# Patient Record
Sex: Male | Born: 1940 | Race: White | Hispanic: No | Marital: Married | Smoking: Former smoker
Health system: Southern US, Community
[De-identification: ages and names within clinical notes are randomized; demographics above are authoritative.]

## PROBLEM LIST (undated history)

## (undated) DIAGNOSIS — R002 Palpitations: Secondary | ICD-10-CM

## (undated) DIAGNOSIS — K5792 Diverticulitis of intestine, part unspecified, without perforation or abscess without bleeding: Secondary | ICD-10-CM

## (undated) DIAGNOSIS — E785 Hyperlipidemia, unspecified: Secondary | ICD-10-CM

## (undated) DIAGNOSIS — M1990 Unspecified osteoarthritis, unspecified site: Secondary | ICD-10-CM

## (undated) DIAGNOSIS — K219 Gastro-esophageal reflux disease without esophagitis: Secondary | ICD-10-CM

## (undated) DIAGNOSIS — I209 Angina pectoris, unspecified: Secondary | ICD-10-CM

## (undated) DIAGNOSIS — Z87442 Personal history of urinary calculi: Secondary | ICD-10-CM

## (undated) HISTORY — DX: Hyperlipidemia, unspecified: E78.5

## (undated) HISTORY — PX: APPENDECTOMY: SHX54

## (undated) HISTORY — DX: Palpitations: R00.2

## (undated) HISTORY — PX: PROSTATE SURGERY: SHX751

## (undated) HISTORY — PX: TONSILLECTOMY: SUR1361

## (undated) HISTORY — DX: Angina pectoris, unspecified: I20.9

---

## 2002-09-02 ENCOUNTER — Ambulatory Visit (HOSPITAL_COMMUNITY): Admission: RE | Admit: 2002-09-02 | Discharge: 2002-09-02 | Payer: Self-pay | Admitting: *Deleted

## 2002-09-02 ENCOUNTER — Encounter (INDEPENDENT_AMBULATORY_CARE_PROVIDER_SITE_OTHER): Payer: Self-pay | Admitting: Specialist

## 2010-07-24 ENCOUNTER — Encounter: Admission: RE | Admit: 2010-07-24 | Discharge: 2010-07-24 | Payer: Self-pay | Admitting: Family Medicine

## 2010-08-07 ENCOUNTER — Encounter: Admission: RE | Admit: 2010-08-07 | Discharge: 2010-08-07 | Payer: Self-pay | Admitting: Sports Medicine

## 2011-09-25 HISTORY — PX: KNEE ARTHROSCOPY: SUR90

## 2011-10-08 DIAGNOSIS — E785 Hyperlipidemia, unspecified: Secondary | ICD-10-CM | POA: Diagnosis not present

## 2011-10-08 DIAGNOSIS — Z125 Encounter for screening for malignant neoplasm of prostate: Secondary | ICD-10-CM | POA: Diagnosis not present

## 2011-10-08 DIAGNOSIS — Z79899 Other long term (current) drug therapy: Secondary | ICD-10-CM | POA: Diagnosis not present

## 2011-10-19 DIAGNOSIS — K219 Gastro-esophageal reflux disease without esophagitis: Secondary | ICD-10-CM | POA: Diagnosis not present

## 2011-10-19 DIAGNOSIS — Z86010 Personal history of colonic polyps: Secondary | ICD-10-CM | POA: Diagnosis not present

## 2011-10-19 DIAGNOSIS — Z79899 Other long term (current) drug therapy: Secondary | ICD-10-CM | POA: Diagnosis not present

## 2011-10-19 DIAGNOSIS — E785 Hyperlipidemia, unspecified: Secondary | ICD-10-CM | POA: Diagnosis not present

## 2011-10-19 DIAGNOSIS — R030 Elevated blood-pressure reading, without diagnosis of hypertension: Secondary | ICD-10-CM | POA: Diagnosis not present

## 2011-10-19 DIAGNOSIS — M810 Age-related osteoporosis without current pathological fracture: Secondary | ICD-10-CM | POA: Diagnosis not present

## 2011-10-19 DIAGNOSIS — Z0000 Encounter for general adult medical examination without abnormal findings: Secondary | ICD-10-CM | POA: Diagnosis not present

## 2012-01-03 DIAGNOSIS — M5412 Radiculopathy, cervical region: Secondary | ICD-10-CM | POA: Diagnosis not present

## 2012-01-03 DIAGNOSIS — R259 Unspecified abnormal involuntary movements: Secondary | ICD-10-CM | POA: Diagnosis not present

## 2012-02-04 ENCOUNTER — Encounter (HOSPITAL_BASED_OUTPATIENT_CLINIC_OR_DEPARTMENT_OTHER): Payer: Self-pay | Admitting: *Deleted

## 2012-02-04 ENCOUNTER — Emergency Department (HOSPITAL_BASED_OUTPATIENT_CLINIC_OR_DEPARTMENT_OTHER)
Admission: EM | Admit: 2012-02-04 | Discharge: 2012-02-04 | Disposition: A | Discharge status: Home / Self Care | Payer: Medicare Other | Attending: Emergency Medicine | Admitting: Emergency Medicine

## 2012-02-04 DIAGNOSIS — J029 Acute pharyngitis, unspecified: Secondary | ICD-10-CM | POA: Insufficient documentation

## 2012-02-04 DIAGNOSIS — J019 Acute sinusitis, unspecified: Secondary | ICD-10-CM | POA: Diagnosis not present

## 2012-02-04 DIAGNOSIS — E785 Hyperlipidemia, unspecified: Secondary | ICD-10-CM | POA: Diagnosis not present

## 2012-02-04 DIAGNOSIS — J329 Chronic sinusitis, unspecified: Secondary | ICD-10-CM | POA: Diagnosis not present

## 2012-02-04 DIAGNOSIS — M810 Age-related osteoporosis without current pathological fracture: Secondary | ICD-10-CM | POA: Diagnosis not present

## 2012-02-04 DIAGNOSIS — K219 Gastro-esophageal reflux disease without esophagitis: Secondary | ICD-10-CM | POA: Insufficient documentation

## 2012-02-04 HISTORY — DX: Hyperlipidemia, unspecified: E78.5

## 2012-02-04 MED ORDER — AMOXICILLIN 500 MG PO CAPS: 500.0000 mg | ORAL_CAPSULE | Freq: Three times a day (TID) | ORAL | Status: AC

## 2012-02-04 NOTE — ED Notes (Signed)
Patient states he has had sore throat for the last week.  Symptoms are associated with sinus drainage and productive cough with yellow-greenish secretions which is worse at night.

## 2012-02-04 NOTE — ED Provider Notes (Signed)
History     CSN: [id number]  Arrival date & time 02/04/12  1040   First MD Initiated Contact with Patient 02/04/12 1203      Chief Complaint  Patient presents with  . Sore Throat    (Consider location/radiation/quality/duration/timing/severity/associated sxs/prior treatment) HPI Comments: Pt states that he has thick mucus and sore throat which seams to trigger coughing at night  Patient is a 71 y.o. male presenting with pharyngitis. The history is provided by the patient. No language interpreter was used.  Sore Throat This is a new problem. The current episode started in the past 7 days. The problem occurs constantly. The problem has been unchanged. Associated symptoms include congestion and a sore throat. Pertinent negatives include no fever, vomiting or weakness. The symptoms are aggravated by nothing. Treatments tried: otc cold medication. The treatment provided mild relief.    Past Medical History  Diagnosis Date  . Reflux   . Osteoporosis   . Hyperlipemia     Past Surgical History  Procedure Date  . Appendectomy   . Knee arthroscopy     No family history on file.  History  Substance Use Topics  . Smoking status: Former Games developer  . Smokeless tobacco: Never Used  . Alcohol Use: Yes     occassionally      Review of Systems  Constitutional: Negative for fever.  HENT: Positive for congestion and sore throat.   Respiratory: Negative.   Cardiovascular: Negative.   Gastrointestinal: Negative for vomiting.  Neurological: Negative.  Negative for weakness.    Allergies  Review of patient's allergies indicates no known allergies.  Home Medications   Current Outpatient Rx  Name Route Sig Dispense Refill  . ASPIRIN 81 MG PO TABS Oral Take 81 mg by mouth daily.    Marland Kitchen EZETIMIBE-SIMVASTATIN 10-10 MG PO TABS Oral Take 1 tablet by mouth at bedtime.    Marland Kitchen FAMOTIDINE 10 MG PO CHEW Oral Chew 10 mg by mouth 2 (two) times daily as needed.    Marland Kitchen VITAMIN D (ERGOCALCIFEROL)  50000 UNITS PO CAPS Oral Take 50,000 Units by mouth.    . AMOXICILLIN 500 MG PO CAPS Oral Take 1 capsule (500 mg total) by mouth 3 (three) times daily. 30 capsule 0    BP 121/60  Pulse 54  Temp(Src) 97.8 F (36.6 C) (Oral)  Resp 20  Ht 5\' 9"  (1.753 m)  Wt 155 lb (70.308 kg)  BMI 22.89 kg/m2  SpO2 100%  Physical Exam  Nursing note and vitals reviewed. Constitutional: He is oriented to person, place, and time. He appears well-developed and well-nourished.  HENT:  Head: Normocephalic and atraumatic.  Right Ear: External ear normal.  Left Ear: External ear normal.  Nose: Right sinus exhibits frontal sinus tenderness.  Mouth/Throat: Posterior oropharyngeal erythema present.  Eyes: Conjunctivae and EOM are normal. Pupils are equal, round, and reactive to light.  Cardiovascular: Normal rate and regular rhythm.   Pulmonary/Chest: Effort normal and breath sounds normal.  Musculoskeletal: Normal range of motion.  Neurological: He is alert and oriented to person, place, and time.    ED Course  Procedures (including critical care time)  Labs Reviewed - No data to display No results found.   1. Pharyngitis   2. Sinusitis       MDM  Pt treated with antibiotics        Teressa Lower, NP 02/04/12 1223

## 2012-02-04 NOTE — Discharge Instructions (Signed)

## 2012-02-09 NOTE — ED Provider Notes (Signed)
Medical screening examination/treatment/procedure(s) were performed by non-physician practitioner and as supervising physician I was immediately available for consultation/collaboration.   Shelda Jakes, MD 02/09/12 [phone]

## 2012-10-08 DIAGNOSIS — Z125 Encounter for screening for malignant neoplasm of prostate: Secondary | ICD-10-CM | POA: Diagnosis not present

## 2012-10-08 DIAGNOSIS — E785 Hyperlipidemia, unspecified: Secondary | ICD-10-CM | POA: Diagnosis not present

## 2012-10-08 DIAGNOSIS — Z79899 Other long term (current) drug therapy: Secondary | ICD-10-CM | POA: Diagnosis not present

## 2012-10-08 DIAGNOSIS — M810 Age-related osteoporosis without current pathological fracture: Secondary | ICD-10-CM | POA: Diagnosis not present

## 2012-10-20 DIAGNOSIS — E785 Hyperlipidemia, unspecified: Secondary | ICD-10-CM | POA: Diagnosis not present

## 2012-10-20 DIAGNOSIS — M1990 Unspecified osteoarthritis, unspecified site: Secondary | ICD-10-CM | POA: Diagnosis not present

## 2012-10-20 DIAGNOSIS — Z0000 Encounter for general adult medical examination without abnormal findings: Secondary | ICD-10-CM | POA: Diagnosis not present

## 2012-10-20 DIAGNOSIS — N400 Enlarged prostate without lower urinary tract symptoms: Secondary | ICD-10-CM | POA: Diagnosis not present

## 2012-10-20 DIAGNOSIS — M810 Age-related osteoporosis without current pathological fracture: Secondary | ICD-10-CM | POA: Diagnosis not present

## 2012-10-20 DIAGNOSIS — K219 Gastro-esophageal reflux disease without esophagitis: Secondary | ICD-10-CM | POA: Diagnosis not present

## 2012-11-05 DIAGNOSIS — Z85828 Personal history of other malignant neoplasm of skin: Secondary | ICD-10-CM | POA: Diagnosis not present

## 2012-11-05 DIAGNOSIS — D2370 Other benign neoplasm of skin of unspecified lower limb, including hip: Secondary | ICD-10-CM | POA: Diagnosis not present

## 2012-11-05 DIAGNOSIS — L570 Actinic keratosis: Secondary | ICD-10-CM | POA: Diagnosis not present

## 2012-11-05 DIAGNOSIS — L821 Other seborrheic keratosis: Secondary | ICD-10-CM | POA: Diagnosis not present

## 2012-11-19 DIAGNOSIS — J019 Acute sinusitis, unspecified: Secondary | ICD-10-CM | POA: Diagnosis not present

## 2013-07-01 DIAGNOSIS — R7309 Other abnormal glucose: Secondary | ICD-10-CM | POA: Diagnosis not present

## 2013-09-25 DIAGNOSIS — H11059 Peripheral pterygium, progressive, unspecified eye: Secondary | ICD-10-CM | POA: Diagnosis not present

## 2013-09-25 DIAGNOSIS — H33009 Unspecified retinal detachment with retinal break, unspecified eye: Secondary | ICD-10-CM | POA: Diagnosis not present

## 2013-09-25 DIAGNOSIS — H5200 Hypermetropia, unspecified eye: Secondary | ICD-10-CM | POA: Diagnosis not present

## 2013-10-14 DIAGNOSIS — Z79899 Other long term (current) drug therapy: Secondary | ICD-10-CM | POA: Diagnosis not present

## 2013-10-14 DIAGNOSIS — Z125 Encounter for screening for malignant neoplasm of prostate: Secondary | ICD-10-CM | POA: Diagnosis not present

## 2013-10-14 DIAGNOSIS — M810 Age-related osteoporosis without current pathological fracture: Secondary | ICD-10-CM | POA: Diagnosis not present

## 2013-10-14 DIAGNOSIS — E785 Hyperlipidemia, unspecified: Secondary | ICD-10-CM | POA: Diagnosis not present

## 2013-10-19 DIAGNOSIS — M9981 Other biomechanical lesions of cervical region: Secondary | ICD-10-CM | POA: Diagnosis not present

## 2013-10-19 DIAGNOSIS — M542 Cervicalgia: Secondary | ICD-10-CM | POA: Diagnosis not present

## 2013-10-23 DIAGNOSIS — Z23 Encounter for immunization: Secondary | ICD-10-CM | POA: Diagnosis not present

## 2013-10-23 DIAGNOSIS — R972 Elevated prostate specific antigen [PSA]: Secondary | ICD-10-CM | POA: Diagnosis not present

## 2013-10-23 DIAGNOSIS — K219 Gastro-esophageal reflux disease without esophagitis: Secondary | ICD-10-CM | POA: Diagnosis not present

## 2013-10-23 DIAGNOSIS — Z86010 Personal history of colonic polyps: Secondary | ICD-10-CM | POA: Diagnosis not present

## 2013-10-23 DIAGNOSIS — R7301 Impaired fasting glucose: Secondary | ICD-10-CM | POA: Diagnosis not present

## 2013-10-23 DIAGNOSIS — M810 Age-related osteoporosis without current pathological fracture: Secondary | ICD-10-CM | POA: Diagnosis not present

## 2013-10-23 DIAGNOSIS — E785 Hyperlipidemia, unspecified: Secondary | ICD-10-CM | POA: Diagnosis not present

## 2013-10-23 DIAGNOSIS — Z1331 Encounter for screening for depression: Secondary | ICD-10-CM | POA: Diagnosis not present

## 2013-10-23 DIAGNOSIS — Z0000 Encounter for general adult medical examination without abnormal findings: Secondary | ICD-10-CM | POA: Diagnosis not present

## 2013-11-18 DIAGNOSIS — R972 Elevated prostate specific antigen [PSA]: Secondary | ICD-10-CM | POA: Diagnosis not present

## 2013-12-17 DIAGNOSIS — M810 Age-related osteoporosis without current pathological fracture: Secondary | ICD-10-CM | POA: Diagnosis not present

## 2014-01-25 ENCOUNTER — Encounter (HOSPITAL_COMMUNITY): Payer: Self-pay | Admitting: Pharmacy Technician

## 2014-01-28 ENCOUNTER — Encounter (HOSPITAL_COMMUNITY): Payer: Self-pay | Admitting: *Deleted

## 2014-02-11 ENCOUNTER — Ambulatory Visit (HOSPITAL_COMMUNITY): Payer: Medicare Other | Admitting: Anesthesiology

## 2014-02-11 ENCOUNTER — Encounter (HOSPITAL_COMMUNITY): Payer: Self-pay | Admitting: Gastroenterology

## 2014-02-11 ENCOUNTER — Encounter (HOSPITAL_COMMUNITY)
Admission: RE | Disposition: A | Discharge status: Home / Self Care | Payer: Self-pay | Source: Ambulatory Visit | Attending: Gastroenterology

## 2014-02-11 ENCOUNTER — Ambulatory Visit (HOSPITAL_COMMUNITY)
Admission: RE | Admit: 2014-02-11 | Discharge: 2014-02-11 | Disposition: A | Discharge status: Home / Self Care | Payer: Medicare Other | Source: Ambulatory Visit | Attending: Gastroenterology | Admitting: Gastroenterology

## 2014-02-11 ENCOUNTER — Encounter (HOSPITAL_COMMUNITY): Payer: Medicare Other | Admitting: Anesthesiology

## 2014-02-11 DIAGNOSIS — D126 Benign neoplasm of colon, unspecified: Secondary | ICD-10-CM | POA: Diagnosis not present

## 2014-02-11 DIAGNOSIS — K5730 Diverticulosis of large intestine without perforation or abscess without bleeding: Secondary | ICD-10-CM | POA: Insufficient documentation

## 2014-02-11 DIAGNOSIS — K219 Gastro-esophageal reflux disease without esophagitis: Secondary | ICD-10-CM | POA: Diagnosis not present

## 2014-02-11 DIAGNOSIS — Z87891 Personal history of nicotine dependence: Secondary | ICD-10-CM | POA: Diagnosis not present

## 2014-02-11 DIAGNOSIS — Z1211 Encounter for screening for malignant neoplasm of colon: Secondary | ICD-10-CM | POA: Diagnosis not present

## 2014-02-11 DIAGNOSIS — E785 Hyperlipidemia, unspecified: Secondary | ICD-10-CM | POA: Diagnosis not present

## 2014-02-11 HISTORY — PX: COLONOSCOPY WITH PROPOFOL: SHX5780

## 2014-02-11 HISTORY — DX: Gastro-esophageal reflux disease without esophagitis: K21.9

## 2014-02-11 HISTORY — DX: Unspecified osteoarthritis, unspecified site: M19.90

## 2014-02-11 SURGERY — COLONOSCOPY WITH PROPOFOL
Anesthesia: Monitor Anesthesia Care

## 2014-02-11 MED ORDER — LACTATED RINGERS IV SOLN
INTRAVENOUS | Status: DC | PRN
  Administered 2014-02-11: 14:00:00 via INTRAVENOUS

## 2014-02-11 MED ORDER — PROPOFOL 10 MG/ML IV BOLUS
INTRAVENOUS | Status: AC
  Filled 2014-02-11: qty 60

## 2014-02-11 MED ORDER — SODIUM CHLORIDE 0.9 % IV SOLN
INTRAVENOUS | Status: DC
  Administered 2014-02-11: 13:00:00 via INTRAVENOUS

## 2014-02-11 MED ORDER — PROPOFOL 10 MG/ML IV BOLUS
INTRAVENOUS | Status: AC
  Filled 2014-02-11: qty 20

## 2014-02-11 MED ORDER — PROPOFOL INFUSION 10 MG/ML OPTIME
INTRAVENOUS | Status: DC | PRN
  Administered 2014-02-11: 300 ug/kg/min via INTRAVENOUS

## 2014-02-11 SURGICAL SUPPLY — 21 items

## 2014-02-11 NOTE — Transfer of Care (Signed)
Immediate Anesthesia Transfer of Care Note  Patient: Brandon Acosta  Procedure(s) Performed: Procedure(s): COLONOSCOPY WITH PROPOFOL (N/A)  Patient Location: PACU and Endoscopy Unit  Anesthesia Type:MAC  Level of Consciousness: sedated and patient cooperative  Airway & Oxygen Therapy: Patient Spontanous Breathing and Patient connected to nasal cannula oxygen  Post-op Assessment: Report given to PACU RN and Post -op Vital signs reviewed and stable  Post vital signs: Reviewed and stable  Complications: No apparent anesthesia complications

## 2014-02-11 NOTE — H&P (Signed)
Brandon Acosta is an 73 y.o. male.   Chief Complaint: History of colon polyps HPI: This patient, who is essentially free of lower GI tract symptoms, presents for colonoscopic surveillance of 2 diminutive adenomatous polyps removed by Dr. Cannon Kettle in April of 2009, having previously had a negative screening colonoscopy some years earlier by Dr. Marcheta Grammes. This more recent exam was characterized by severe technical difficulty related to diverticular change and fixation of the colon.  Past Medical History  Diagnosis Date  . Osteoporosis   . Hyperlipemia   . GERD (gastroesophageal reflux disease)   . Arthritis     Past Surgical History  Procedure Laterality Date  . Knee arthroscopy Right 2013    torn meniscus repair  . Appendectomy  as child    History reviewed. No pertinent family history. Social History:  reports that he quit smoking about 37 years ago. His smoking use included Cigarettes. He has a 10 pack-year smoking history. He has never used smokeless tobacco. He reports that he drinks alcohol. He reports that he does not use illicit drugs.  Allergies: No Known Allergies  Medications Prior to Admission  Medication Sig Dispense Refill  . aspirin 81 MG tablet Take 81 mg by mouth daily.      . cholecalciferol (VITAMIN D) 1000 UNITS tablet Take 1,000 Units by mouth daily.      Marland Kitchen ezetimibe-simvastatin (VYTORIN) 10-40 MG per tablet Take 1 tablet by mouth every evening.      . famotidine (PEPCID AC) 10 MG chewable tablet Chew 10 mg by mouth 2 (two) times daily as needed for heartburn.         No results found for this or any previous visit (from the past 48 hour(s)). No results found.  ROS negative  Blood pressure 136/62, pulse 54, temperature 98.6 F (37 C), temperature source Oral, resp. rate 16. Physical Exam  Pleasant, thin, healthy-appearing Caucasian male. Chest clear, heart without murmur or arrhythmia, abdomen without guarding, mass effect, or tenderness. No  pallor or icterus. Neurologically and cognitively grossly intact.  Assessment/Plan Patient due for surveillance colonoscopy, with history of extensive diverticular change and technical impediment to colonoscopic evaluation.  We will proceed to attempted colonoscopic evaluation using the ultraslim colonoscope under propofol sedation and, if available, carbon dioxide for insufflation to minimize post procedural bloating. The risks the procedure have been discussed with the patient and he is agreeable to proceed.  Brandon Acosta 02/11/2014, 2:15 PM

## 2014-02-11 NOTE — Anesthesia Postprocedure Evaluation (Signed)
  Anesthesia Post-op Note  Patient: Brandon Acosta  Procedure(s) Performed: Procedure(s) (LRB): COLONOSCOPY WITH PROPOFOL (N/A)  Patient Location: PACU  Anesthesia Type: MAC  Level of Consciousness: awake and alert   Airway and Oxygen Therapy: Patient Spontanous Breathing  Post-op Pain: mild  Post-op Assessment: Post-op Vital signs reviewed, Patient's Cardiovascular Status Stable, Respiratory Function Stable, Patent Airway and No signs of Nausea or vomiting  Last Vitals:  Filed Vitals:   02/11/14 1515  BP: 97/60  Pulse: 54  Temp:   Resp: 13    Post-op Vital Signs: stable   Complications: No apparent anesthesia complications

## 2014-02-11 NOTE — Discharge Instructions (Signed)
Call [phone] if pain occurs tonight.  Clear liquid diet tonight; may resume normal diet tomorrow a.m. If feeling ok

## 2014-02-11 NOTE — Anesthesia Preprocedure Evaluation (Addendum)
Anesthesia Evaluation  Patient identified by MRN, date of birth, ID band Patient awake    Reviewed: Allergy & Precautions, H&P , NPO status , Patient's Chart, lab work & pertinent test results  Airway Mallampati: II TM Distance: >3 FB Neck ROM: Full    Dental no notable dental hx.    Pulmonary neg pulmonary ROS, former smoker,  breath sounds clear to auscultation  Pulmonary exam normal       Cardiovascular negative cardio ROS  Rhythm:Regular Rate:Normal     Neuro/Psych negative neurological ROS  negative psych ROS   GI/Hepatic negative GI ROS, Neg liver ROS,   Endo/Other  negative endocrine ROS  Renal/GU negative Renal ROS  negative genitourinary   Musculoskeletal negative musculoskeletal ROS (+)   Abdominal   Peds negative pediatric ROS (+)  Hematology negative hematology ROS (+)   Anesthesia Other Findings   Reproductive/Obstetrics negative OB ROS                          Anesthesia Physical Anesthesia Plan  ASA: II  Anesthesia Plan: MAC   Post-op Pain Management:    Induction:   Airway Management Planned: Natural Airway  Additional Equipment:   Intra-op Plan:   Post-operative Plan:   Informed Consent: I have reviewed the patients History and Physical, chart, labs and discussed the procedure including the risks, benefits and alternatives for the proposed anesthesia with the patient or authorized representative who has indicated his/her understanding and acceptance.   Dental advisory given  Plan Discussed with: CRNA  Anesthesia Plan Comments:         Anesthesia Quick Evaluation

## 2014-02-11 NOTE — Op Note (Signed)
[facility] [place], [address]   COLONOSCOPY PROCEDURE REPORT  PATIENT: Brandon, Acosta  MR#: [id number] BIRTHDATE: May 14, 1941 , 73  yrs. old GENDER: Male ENDOSCOPIST: Ronald Lobo, MD REFERRED BY:   [name], MD PROCEDURE DATE:  02/11/2014 PROCEDURE:     colonoscopy with polypectomy and biopsy ASA CLASS: INDICATIONS: surveillance. Patient had 2 diminutive adenomatous polyps removed 5 years ago MEDICATIONS:    MAC per anesthesia  DESCRIPTION OF PROCEDURE: the patient came as an outpatient to the [facility] long endoscopy unit. He provided written consent. Time out was performed. He remained stable throughout the procedure with MAC sedation.  It was known from his previous procedure that his diverticular disease was associated with significant fixation of the sigmoid region, so this exam was done under propofol sedation, using the ultraslim colonoscope and carbon dioxide insufflation.the scope was able to be advanced through the sigmoid area without significant difficulty by this technique.  Digital exam of the prostate gland showed it to be homogenously enlarged, without any nodules. The scope was advanced to the terminal ileum which had a normal appearance. The cecum was further identified by visualization of the appendiceal orifice and pullback was then performed in a gradual fashion.  The quality of the prep was very good and it is felt that all areas were well seen.  In the proximal ascending colon there was a 2 mm sessile polyp removed by a couple of cold biopsies.  Just distal to this, in the proximal ascending colon, was a 3 x 6 mm polyp along the edge of a fold. This was removed by cold snare technique and the polyp fragment was retrieved by suctioning.  During further withdrawal, an inverted diverticulum was observed a short distance distal to the polypectomy site. This was able to be everted, and had an appearance somewhat  similar to where I had done the polypectomy. I therefore went back and inspected the polypectomy site and saw no evidence of any perforation or hole. Nonetheless, as a precaution, I elected to put a clip on that area. This seemed to cinch the mucosa down very effectively. Therefore, a second clip was not applied.  During further pullback around the colon, the patient was noted to have fairly extensive scattered diverticulosis, but no additional polyps were seen. I did not see any masses, colitis, or vascular malformations.  Retroflexion in the rectum and reinspection of the rectum were unremarkable.  The patient tolerated the procedure well.     COMPLICATIONS: None  ENDOSCOPIC IMPRESSION:  1. 2 small polyps removed as described above 2. Extensive diverticulosis 3. Possibility that the polypectomy involved an inverted diverticulum, so clip applied to help seal the area in case this was an inverted diverticulum   RECOMMENDATIONS:  1. Watch for delayedpost polypectomy pain. Obtain CT if pain develops. Even if some extraluminal air were identified, I think this patient could probably be managed with n.p.o., bowel rest, and close observation rather than surgery  2. Await pathology on polyps. Anticipate surveillance exam in 5 years.    _______________________________ eSignedRonald Lobo, MD 02/11/2014 3:19 PM     PATIENT NAME:  Brandon, Acosta MR#: [id number]

## 2014-02-12 ENCOUNTER — Encounter (HOSPITAL_COMMUNITY): Payer: Self-pay | Admitting: Gastroenterology

## 2014-03-24 DIAGNOSIS — M2240 Chondromalacia patellae, unspecified knee: Secondary | ICD-10-CM | POA: Diagnosis not present

## 2014-03-24 DIAGNOSIS — M1710 Unilateral primary osteoarthritis, unspecified knee: Secondary | ICD-10-CM | POA: Diagnosis not present

## 2014-05-25 ENCOUNTER — Ambulatory Visit
Admission: RE | Admit: 2014-05-25 | Discharge: 2014-05-25 | Disposition: A | Discharge status: Home / Self Care | Payer: Medicare Other | Source: Ambulatory Visit | Attending: Gastroenterology | Admitting: Gastroenterology

## 2014-05-25 ENCOUNTER — Other Ambulatory Visit: Payer: Self-pay | Admitting: Gastroenterology

## 2014-05-25 DIAGNOSIS — K5730 Diverticulosis of large intestine without perforation or abscess without bleeding: Secondary | ICD-10-CM

## 2014-05-25 DIAGNOSIS — R1909 Other intra-abdominal and pelvic swelling, mass and lump: Secondary | ICD-10-CM | POA: Diagnosis not present

## 2014-06-02 DIAGNOSIS — K219 Gastro-esophageal reflux disease without esophagitis: Secondary | ICD-10-CM | POA: Diagnosis not present

## 2014-06-02 DIAGNOSIS — R972 Elevated prostate specific antigen [PSA]: Secondary | ICD-10-CM | POA: Diagnosis not present

## 2014-06-02 DIAGNOSIS — R7301 Impaired fasting glucose: Secondary | ICD-10-CM | POA: Diagnosis not present

## 2014-06-02 DIAGNOSIS — Z79899 Other long term (current) drug therapy: Secondary | ICD-10-CM | POA: Diagnosis not present

## 2014-06-02 DIAGNOSIS — E785 Hyperlipidemia, unspecified: Secondary | ICD-10-CM | POA: Diagnosis not present

## 2014-06-02 DIAGNOSIS — M810 Age-related osteoporosis without current pathological fracture: Secondary | ICD-10-CM | POA: Diagnosis not present

## 2014-07-01 DIAGNOSIS — R972 Elevated prostate specific antigen [PSA]: Secondary | ICD-10-CM | POA: Diagnosis not present

## 2014-07-01 DIAGNOSIS — R739 Hyperglycemia, unspecified: Secondary | ICD-10-CM | POA: Diagnosis not present

## 2014-11-09 ENCOUNTER — Encounter (HOSPITAL_BASED_OUTPATIENT_CLINIC_OR_DEPARTMENT_OTHER): Payer: Self-pay | Admitting: *Deleted

## 2014-11-09 ENCOUNTER — Emergency Department (HOSPITAL_BASED_OUTPATIENT_CLINIC_OR_DEPARTMENT_OTHER): Payer: Medicare Other

## 2014-11-09 ENCOUNTER — Emergency Department (HOSPITAL_BASED_OUTPATIENT_CLINIC_OR_DEPARTMENT_OTHER)
Admission: EM | Admit: 2014-11-09 | Discharge: 2014-11-09 | Disposition: A | Discharge status: Home / Self Care | Payer: Medicare Other | Attending: Emergency Medicine | Admitting: Emergency Medicine

## 2014-11-09 DIAGNOSIS — R002 Palpitations: Secondary | ICD-10-CM | POA: Diagnosis not present

## 2014-11-09 DIAGNOSIS — M1990 Unspecified osteoarthritis, unspecified site: Secondary | ICD-10-CM | POA: Diagnosis not present

## 2014-11-09 DIAGNOSIS — R0789 Other chest pain: Secondary | ICD-10-CM

## 2014-11-09 DIAGNOSIS — M810 Age-related osteoporosis without current pathological fracture: Secondary | ICD-10-CM | POA: Insufficient documentation

## 2014-11-09 DIAGNOSIS — Z7982 Long term (current) use of aspirin: Secondary | ICD-10-CM | POA: Diagnosis not present

## 2014-11-09 DIAGNOSIS — R079 Chest pain, unspecified: Secondary | ICD-10-CM | POA: Diagnosis present

## 2014-11-09 DIAGNOSIS — Z87891 Personal history of nicotine dependence: Secondary | ICD-10-CM | POA: Insufficient documentation

## 2014-11-09 DIAGNOSIS — K219 Gastro-esophageal reflux disease without esophagitis: Secondary | ICD-10-CM | POA: Diagnosis not present

## 2014-11-09 DIAGNOSIS — Z79899 Other long term (current) drug therapy: Secondary | ICD-10-CM | POA: Diagnosis not present

## 2014-11-09 DIAGNOSIS — E785 Hyperlipidemia, unspecified: Secondary | ICD-10-CM | POA: Insufficient documentation

## 2014-11-09 HISTORY — DX: Diverticulitis of intestine, part unspecified, without perforation or abscess without bleeding: K57.92

## 2014-11-09 LAB — COMPREHENSIVE METABOLIC PANEL
ALT: 25 U/L (ref 0–53)
AST: 27 U/L (ref 0–37)
Albumin: 3.5 g/dL (ref 3.5–5.2)
Alkaline Phosphatase: 59 U/L (ref 39–117)
Anion gap: 2 — ABNORMAL LOW (ref 5–15)
BUN: 17 mg/dL (ref 6–23)
CALCIUM: 8.5 mg/dL (ref 8.4–10.5)
CO2: 26 mmol/L (ref 19–32)
CREATININE: 0.67 mg/dL (ref 0.50–1.35)
Chloride: 107 mmol/L (ref 96–112)
GFR calc Af Amer: >90 mL/min (ref >90)
GFR calc non Af Amer: >90 mL/min (ref >90)
GLUCOSE: 148 mg/dL — AB (ref 70–99)
Potassium: 3.8 mmol/L (ref 3.5–5.1)
SODIUM: 135 mmol/L (ref 135–145)
TOTAL PROTEIN: 6.5 g/dL (ref 6.0–8.3)
Total Bilirubin: 0.6 mg/dL (ref 0.3–1.2)

## 2014-11-09 LAB — CBC WITH DIFFERENTIAL/PLATELET
BASOS PCT: 1 % (ref 0–1)
Basophils Absolute: 0.1 10*3/uL (ref 0.0–0.1)
EOS PCT: 0 % (ref 0–5)
Eosinophils Absolute: 0 10*3/uL (ref 0.0–0.7)
HCT: 40.5 % (ref 39.0–52.0)
HEMOGLOBIN: 13.4 g/dL (ref 13.0–17.0)
Lymphocytes Relative: 9 % — ABNORMAL LOW (ref 12–46)
Lymphs Abs: 1.2 10*3/uL (ref 0.7–4.0)
MCH: 32.3 pg (ref 26.0–34.0)
MCHC: 33.1 g/dL (ref 30.0–36.0)
MCV: 97.6 fL (ref 78.0–100.0)
Monocytes Absolute: 1.5 10*3/uL — ABNORMAL HIGH (ref 0.1–1.0)
Monocytes Relative: 12 % (ref 3–12)
NEUTROS PCT: 78 % — AB (ref 43–77)
Neutro Abs: 10 10*3/uL — ABNORMAL HIGH (ref 1.7–7.7)
Platelets: 183 10*3/uL (ref 150–400)
RBC: 4.15 MIL/uL — AB (ref 4.22–5.81)
RDW: 12.8 % (ref 11.5–15.5)
WBC: 12.8 10*3/uL — ABNORMAL HIGH (ref 4.0–10.5)

## 2014-11-09 LAB — TROPONIN I: Troponin I: <0.03 ng/mL (ref <0.031)

## 2014-11-09 NOTE — Discharge Instructions (Signed)

## 2014-11-09 NOTE — ED Notes (Signed)
Patient states he has had several months history of "having a strange feelings" inside of his chest, like fluttering or lightness.  States last night he was asleep and was awakened by pain and tightness in his central chest which lasted for 10-15 minutes.  States he went back to sleep and had some residual pain this morning.

## 2014-11-09 NOTE — ED Provider Notes (Signed)
CSN: [id number]     Arrival date & time 11/09/14  0957 History   First MD Initiated Contact with Patient 11/09/14 1241     Chief Complaint  Patient presents with  . Chest Pain     (Consider location/radiation/quality/duration/timing/severity/associated sxs/prior Treatment) HPI Brandon Acosta is a 74 year old male with past medical history of hyperlipidemia, GERD, who presents the ER complaining of chest discomfort. Patient describes several months worth of intermittent vague feelings of his chest which she describes as "fluttering and lightness". He reports his symptoms are mostly noticeable at rest. He states he has not had any symptoms during exertion, and typically will not notice any of the symptoms when he is up and walking around. Patient reports having a painful sensation this morning which he describes as a tightness which woke him up from sleep around 2 AM this morning.Patient states it lasts for 5-10 minutes and resolved spontaneously. Patient states the discomfort radiated across his central chest, and he is now experiencing some mild tightness in his upper left chest. He states occasionally his symptoms have radiated into his left neck.  Patient denies associated shortness of breath, nausea, vomiting, lightheadedness, dizziness, weakness, blurred vision, abdominal pain.  Past Medical History  Diagnosis Date  . Osteoporosis   . Hyperlipemia   . GERD (gastroesophageal reflux disease)   . Arthritis   . Diverticulitis    Past Surgical History  Procedure Laterality Date  . Knee arthroscopy Right 2013    torn meniscus repair  . Appendectomy  as child  . Colonoscopy with propofol N/A 02/11/2014    Procedure: COLONOSCOPY WITH PROPOFOL;  Surgeon: Cleotis Nipper, MD;  Location: [facility] ENDOSCOPY;  Service: Endoscopy;  Laterality: N/A;   No family history on file. History  Substance Use Topics  . Smoking status: Former Smoker -- 0.50 packs/day for 20 years    Types: Cigarettes     Quit date: 09/24/1976  . Smokeless tobacco: Never Used  . Alcohol Use: Yes     Comment: occassionally    Review of Systems  Constitutional: Negative for fever.  HENT: Negative for trouble swallowing.   Eyes: Negative for visual disturbance.  Respiratory: Negative for shortness of breath.   Cardiovascular: Positive for chest pain and palpitations.  Gastrointestinal: Negative for nausea, vomiting and abdominal pain.  Genitourinary: Negative for dysuria.  Musculoskeletal: Negative for neck pain.  Skin: Negative for rash.  Neurological: Negative for dizziness, weakness and numbness.  Psychiatric/Behavioral: Negative.       Allergies  Review of patient's allergies indicates no known allergies.  Home Medications   Prior to Admission medications   Medication Sig Start Date End Date Taking? Authorizing Provider  aspirin 81 MG tablet Take 81 mg by mouth daily.    Historical Provider, MD  cholecalciferol (VITAMIN D) 1000 UNITS tablet Take 1,000 Units by mouth daily.    Historical Provider, MD  ezetimibe-simvastatin (VYTORIN) 10-40 MG per tablet Take 1 tablet by mouth every evening.    Historical Provider, MD  famotidine (PEPCID AC) 10 MG chewable tablet Chew 10 mg by mouth 2 (two) times daily as needed for heartburn.     Historical Provider, MD   BP 109/56 mmHg  Pulse 58  Temp(Src) 98.4 F (36.9 C) (Oral)  Resp 18  Ht 5\' 8"  (1.727 m)  Wt 150 lb (68.04 kg)  BMI 22.81 kg/m2  SpO2 98% Physical Exam  Constitutional: He is oriented to person, place, and time. He appears well-developed and well-nourished. No distress.  HENT:  Head: Normocephalic and atraumatic.  Mouth/Throat: Oropharynx is clear and moist. No oropharyngeal exudate.  Eyes: Right eye exhibits no discharge. Left eye exhibits no discharge. No scleral icterus.  Neck: Normal range of motion.  Cardiovascular: Normal rate, regular rhythm and normal heart sounds.   No murmur heard. Pulmonary/Chest: Effort normal and breath  sounds normal. No respiratory distress.  Abdominal: Soft. There is no tenderness.  Musculoskeletal: Normal range of motion. He exhibits no edema or tenderness.  Neurological: He is alert and oriented to person, place, and time. No cranial nerve deficit. Coordination normal.  Patient fully alert, answering questions appropriately in full, clear sentences. Cranial nerves II through XII grossly intact. Motor strength 5 out of 5 in all major muscle groups of upper and lower extremities. Distal sensation intact.  Skin: Skin is warm and dry. No rash noted. He is not diaphoretic.  Psychiatric: He has a normal mood and affect.  Nursing note and vitals reviewed.   ED Course  Procedures (including critical care time) Labs Review Labs Reviewed  CBC WITH DIFFERENTIAL/PLATELET - Abnormal; Notable for the following:    WBC 12.8 (*)    RBC 4.15 (*)    Neutrophils Relative % 78 (*)    Lymphocytes Relative 9 (*)    Neutro Abs 10.0 (*)    Monocytes Absolute 1.5 (*)    All other components within normal limits  COMPREHENSIVE METABOLIC PANEL - Abnormal; Notable for the following:    Glucose, Bld 148 (*)    Anion gap 2 (*)    All other components within normal limits  TROPONIN I  TROPONIN I    Imaging Review Dg Chest 2 View  11/09/2014   CLINICAL DATA:  Chest pain  EXAM: CHEST  2 VIEW  COMPARISON:  None.  FINDINGS: The heart size and mediastinal contours are within normal limits. Both lungs are clear. The visualized skeletal structures are unremarkable.  IMPRESSION: No active cardiopulmonary disease.   Electronically Signed   By: Franchot Gallo M.D.   On: 11/09/2014 10:50     EKG Interpretation   Date/Time:  Tuesday November 09 2014 10:12:01 EST Ventricular Rate:  71 PR Interval:  180 QRS Duration: 84 QT Interval:  368 QTC Calculation: 399 R Axis:   72 Text Interpretation:  Sinus rhythm with Premature atrial complexes  Otherwise normal ECG no acute changes Confirmed by Kathrynn Humble, MD, Thelma Comp   [phone]) on 11/09/2014 12:42:26 PM      MDM   Final diagnoses:  Atypical chest pain    Patient here with complaint of chest discomfort which is atypical for any specific pathology. Chest pain is not likely of cardiac or pulmonary etiology d/t presentation, Wells criteria for PE 0.0, heart score 3 VSS, no tracheal deviation, no JVD or new murmur, RRR, breath sounds equal bilaterally, EKG without acute abnormalities, negative troponin x2, and negative CXR. Pt has been advised return to the ED is CP becomes exertional, associated with diaphoresis or nausea, radiates to left jaw/arm, worsens or becomes concerning in any way. Patient encouraged to follow-up with his primary care provider as well as with cardiology. Return precautions discussed, patient verbalizes understanding and agreement this plan. Patient encouraged to call or return to the ER should he have any questions or concerns.  BP 109/56 mmHg  Pulse 58  Temp(Src) 98.4 F (36.9 C) (Oral)  Resp 18  Ht 5\' 8"  (1.727 m)  Wt 150 lb (68.04 kg)  BMI 22.81 kg/m2  SpO2 98%  Signed,  Dahlia Bailiff,  PA-C 11:53 PM  Patient seen and discussed with Dr. Varney Biles, MD  Carrie Mew, PA-C 11/09/14 8182  Varney Biles, MD 11/10/14 1537

## 2014-11-11 DIAGNOSIS — Z136 Encounter for screening for cardiovascular disorders: Secondary | ICD-10-CM | POA: Diagnosis not present

## 2014-11-11 DIAGNOSIS — R079 Chest pain, unspecified: Secondary | ICD-10-CM | POA: Diagnosis not present

## 2014-11-11 DIAGNOSIS — R972 Elevated prostate specific antigen [PSA]: Secondary | ICD-10-CM | POA: Diagnosis not present

## 2014-11-11 DIAGNOSIS — R7989 Other specified abnormal findings of blood chemistry: Secondary | ICD-10-CM | POA: Diagnosis not present

## 2014-11-11 DIAGNOSIS — Z125 Encounter for screening for malignant neoplasm of prostate: Secondary | ICD-10-CM | POA: Diagnosis not present

## 2014-11-11 DIAGNOSIS — E785 Hyperlipidemia, unspecified: Secondary | ICD-10-CM | POA: Diagnosis not present

## 2014-11-11 DIAGNOSIS — R7301 Impaired fasting glucose: Secondary | ICD-10-CM | POA: Diagnosis not present

## 2014-11-11 DIAGNOSIS — K219 Gastro-esophageal reflux disease without esophagitis: Secondary | ICD-10-CM | POA: Diagnosis not present

## 2014-11-11 DIAGNOSIS — R739 Hyperglycemia, unspecified: Secondary | ICD-10-CM | POA: Diagnosis not present

## 2014-11-11 DIAGNOSIS — M810 Age-related osteoporosis without current pathological fracture: Secondary | ICD-10-CM | POA: Diagnosis not present

## 2014-11-11 DIAGNOSIS — Z79899 Other long term (current) drug therapy: Secondary | ICD-10-CM | POA: Diagnosis not present

## 2014-11-18 DIAGNOSIS — R002 Palpitations: Secondary | ICD-10-CM | POA: Diagnosis not present

## 2014-11-18 DIAGNOSIS — R079 Chest pain, unspecified: Secondary | ICD-10-CM | POA: Diagnosis not present

## 2014-11-18 DIAGNOSIS — E784 Other hyperlipidemia: Secondary | ICD-10-CM | POA: Diagnosis not present

## 2014-11-18 DIAGNOSIS — R0789 Other chest pain: Secondary | ICD-10-CM | POA: Diagnosis not present

## 2014-11-18 DIAGNOSIS — K219 Gastro-esophageal reflux disease without esophagitis: Secondary | ICD-10-CM | POA: Diagnosis not present

## 2014-11-24 DIAGNOSIS — Z79899 Other long term (current) drug therapy: Secondary | ICD-10-CM | POA: Diagnosis not present

## 2014-11-24 DIAGNOSIS — Z0000 Encounter for general adult medical examination without abnormal findings: Secondary | ICD-10-CM | POA: Diagnosis not present

## 2014-11-24 DIAGNOSIS — K219 Gastro-esophageal reflux disease without esophagitis: Secondary | ICD-10-CM | POA: Diagnosis not present

## 2014-11-24 DIAGNOSIS — R7301 Impaired fasting glucose: Secondary | ICD-10-CM | POA: Diagnosis not present

## 2014-11-24 DIAGNOSIS — Z125 Encounter for screening for malignant neoplasm of prostate: Secondary | ICD-10-CM | POA: Diagnosis not present

## 2014-11-24 DIAGNOSIS — M810 Age-related osteoporosis without current pathological fracture: Secondary | ICD-10-CM | POA: Diagnosis not present

## 2014-11-24 DIAGNOSIS — E785 Hyperlipidemia, unspecified: Secondary | ICD-10-CM | POA: Diagnosis not present

## 2014-11-24 DIAGNOSIS — Z86010 Personal history of colonic polyps: Secondary | ICD-10-CM | POA: Diagnosis not present

## 2014-11-24 DIAGNOSIS — N400 Enlarged prostate without lower urinary tract symptoms: Secondary | ICD-10-CM | POA: Diagnosis not present

## 2014-12-09 DIAGNOSIS — E784 Other hyperlipidemia: Secondary | ICD-10-CM | POA: Diagnosis not present

## 2014-12-09 DIAGNOSIS — R002 Palpitations: Secondary | ICD-10-CM | POA: Diagnosis not present

## 2014-12-09 DIAGNOSIS — R0789 Other chest pain: Secondary | ICD-10-CM | POA: Diagnosis not present

## 2014-12-09 DIAGNOSIS — K219 Gastro-esophageal reflux disease without esophagitis: Secondary | ICD-10-CM | POA: Diagnosis not present

## 2014-12-21 DIAGNOSIS — E784 Other hyperlipidemia: Secondary | ICD-10-CM | POA: Diagnosis not present

## 2014-12-21 DIAGNOSIS — R002 Palpitations: Secondary | ICD-10-CM | POA: Diagnosis not present

## 2014-12-21 DIAGNOSIS — R0789 Other chest pain: Secondary | ICD-10-CM | POA: Diagnosis not present

## 2014-12-21 DIAGNOSIS — K219 Gastro-esophageal reflux disease without esophagitis: Secondary | ICD-10-CM | POA: Diagnosis not present

## 2015-02-12 DIAGNOSIS — R350 Frequency of micturition: Secondary | ICD-10-CM | POA: Diagnosis not present

## 2015-06-17 DIAGNOSIS — K219 Gastro-esophageal reflux disease without esophagitis: Secondary | ICD-10-CM | POA: Diagnosis not present

## 2015-06-17 DIAGNOSIS — R002 Palpitations: Secondary | ICD-10-CM | POA: Diagnosis not present

## 2015-06-17 DIAGNOSIS — E784 Other hyperlipidemia: Secondary | ICD-10-CM | POA: Diagnosis not present

## 2015-06-17 DIAGNOSIS — R0789 Other chest pain: Secondary | ICD-10-CM | POA: Diagnosis not present

## 2015-07-16 DIAGNOSIS — R300 Dysuria: Secondary | ICD-10-CM | POA: Diagnosis not present

## 2015-07-16 DIAGNOSIS — N410 Acute prostatitis: Secondary | ICD-10-CM | POA: Diagnosis not present

## 2015-07-28 DIAGNOSIS — H2513 Age-related nuclear cataract, bilateral: Secondary | ICD-10-CM | POA: Diagnosis not present

## 2015-07-28 DIAGNOSIS — H5203 Hypermetropia, bilateral: Secondary | ICD-10-CM | POA: Diagnosis not present

## 2015-07-28 DIAGNOSIS — H524 Presbyopia: Secondary | ICD-10-CM | POA: Diagnosis not present

## 2015-08-31 DIAGNOSIS — R350 Frequency of micturition: Secondary | ICD-10-CM | POA: Diagnosis not present

## 2015-08-31 DIAGNOSIS — N401 Enlarged prostate with lower urinary tract symptoms: Secondary | ICD-10-CM | POA: Diagnosis not present

## 2015-11-29 DIAGNOSIS — E785 Hyperlipidemia, unspecified: Secondary | ICD-10-CM | POA: Diagnosis not present

## 2015-11-29 DIAGNOSIS — M810 Age-related osteoporosis without current pathological fracture: Secondary | ICD-10-CM | POA: Diagnosis not present

## 2015-11-29 DIAGNOSIS — Z79899 Other long term (current) drug therapy: Secondary | ICD-10-CM | POA: Diagnosis not present

## 2015-11-29 DIAGNOSIS — R7301 Impaired fasting glucose: Secondary | ICD-10-CM | POA: Diagnosis not present

## 2015-12-07 DIAGNOSIS — K219 Gastro-esophageal reflux disease without esophagitis: Secondary | ICD-10-CM | POA: Diagnosis not present

## 2015-12-07 DIAGNOSIS — R7301 Impaired fasting glucose: Secondary | ICD-10-CM | POA: Diagnosis not present

## 2015-12-07 DIAGNOSIS — Z125 Encounter for screening for malignant neoplasm of prostate: Secondary | ICD-10-CM | POA: Diagnosis not present

## 2015-12-07 DIAGNOSIS — Z0000 Encounter for general adult medical examination without abnormal findings: Secondary | ICD-10-CM | POA: Diagnosis not present

## 2015-12-07 DIAGNOSIS — Z86010 Personal history of colonic polyps: Secondary | ICD-10-CM | POA: Diagnosis not present

## 2015-12-07 DIAGNOSIS — M810 Age-related osteoporosis without current pathological fracture: Secondary | ICD-10-CM | POA: Diagnosis not present

## 2015-12-07 DIAGNOSIS — N400 Enlarged prostate without lower urinary tract symptoms: Secondary | ICD-10-CM | POA: Diagnosis not present

## 2015-12-07 DIAGNOSIS — E785 Hyperlipidemia, unspecified: Secondary | ICD-10-CM | POA: Diagnosis not present

## 2015-12-28 DIAGNOSIS — M8589 Other specified disorders of bone density and structure, multiple sites: Secondary | ICD-10-CM | POA: Diagnosis not present

## 2015-12-28 DIAGNOSIS — M810 Age-related osteoporosis without current pathological fracture: Secondary | ICD-10-CM | POA: Diagnosis not present

## 2016-04-20 DIAGNOSIS — N390 Urinary tract infection, site not specified: Secondary | ICD-10-CM | POA: Diagnosis not present

## 2016-04-20 DIAGNOSIS — R509 Fever, unspecified: Secondary | ICD-10-CM | POA: Diagnosis not present

## 2016-08-08 DIAGNOSIS — L570 Actinic keratosis: Secondary | ICD-10-CM | POA: Diagnosis not present

## 2016-08-08 DIAGNOSIS — Z85828 Personal history of other malignant neoplasm of skin: Secondary | ICD-10-CM | POA: Diagnosis not present

## 2016-08-08 DIAGNOSIS — L821 Other seborrheic keratosis: Secondary | ICD-10-CM | POA: Diagnosis not present

## 2016-08-08 DIAGNOSIS — L820 Inflamed seborrheic keratosis: Secondary | ICD-10-CM | POA: Diagnosis not present

## 2016-08-08 DIAGNOSIS — L218 Other seborrheic dermatitis: Secondary | ICD-10-CM | POA: Diagnosis not present

## 2016-08-08 DIAGNOSIS — D2371 Other benign neoplasm of skin of right lower limb, including hip: Secondary | ICD-10-CM | POA: Diagnosis not present

## 2016-08-26 DIAGNOSIS — N50819 Testicular pain, unspecified: Secondary | ICD-10-CM | POA: Diagnosis not present

## 2016-09-04 DIAGNOSIS — K219 Gastro-esophageal reflux disease without esophagitis: Secondary | ICD-10-CM | POA: Diagnosis not present

## 2016-09-04 DIAGNOSIS — R0789 Other chest pain: Secondary | ICD-10-CM | POA: Diagnosis not present

## 2016-09-04 DIAGNOSIS — E784 Other hyperlipidemia: Secondary | ICD-10-CM | POA: Diagnosis not present

## 2016-09-04 DIAGNOSIS — R002 Palpitations: Secondary | ICD-10-CM | POA: Diagnosis not present

## 2016-09-28 ENCOUNTER — Encounter: Payer: Self-pay | Admitting: Cardiology

## 2016-09-28 DIAGNOSIS — E784 Other hyperlipidemia: Secondary | ICD-10-CM | POA: Diagnosis not present

## 2016-09-28 DIAGNOSIS — I209 Angina pectoris, unspecified: Secondary | ICD-10-CM | POA: Diagnosis not present

## 2016-09-28 DIAGNOSIS — R0789 Other chest pain: Secondary | ICD-10-CM | POA: Diagnosis not present

## 2016-09-28 DIAGNOSIS — K219 Gastro-esophageal reflux disease without esophagitis: Secondary | ICD-10-CM | POA: Diagnosis not present

## 2016-09-28 NOTE — Progress Notes (Signed)
Dayton, Ropp  Date of visit:  09/28/2016 DOB:  May 10, 1941    Age:  76 yrs. Medical record number:  [id number]     Account number:  [id number] Primary Care Provider: [name] ____________________________ CURRENT DIAGNOSES  1. Angina pectoris, unspecified  2. Gastro-esophageal reflux disease  3. Hyperlipidemia ____________________________ ALLERGIES  No Known Allergies ____________________________ MEDICATIONS  1. Pepcid AC 20 mg tablet, PRN  2. Vitamin D3 1,000 unit tablet, 1 p.o. daily  3. aspirin 81 mg chewable tablet, 1 p.o. daily  4. nitroglycerin 0.4 mg sublingual tablet, PRN  5. simvastatin 40 mg tablet, three times a week  6. tamsulosin 0.4 mg capsule, 1 p.o. daily  7. metoprolol succinate ER 25 mg tablet,extended release 24 hr, 1 p.o. daily ____________________________ HISTORY OF PRESENT ILLNESS Patient has had recurrent angina usually with walking and cold air and does note discomfort with exercise. He had a negative myocardial perfusion scan although associated with ST depression over a year and a half ago. He has noted increasing frequency and severity of symptoms and had an exercise treadmill test today that was clinically positive for angina and Bruce stage III along with 2-3 mm of ST segment depression in the lateral leads. His risk factors include hyperlipidemia which is currently treated. He denies PND, orthopnea or claudication. ____________________________ PAST HISTORY  Past Medical Illnesses:  hyperlipidemia, GERD, BPH, cervical disc disease;  Cardiovascular Illnesses:  angina;  NYHA Classification:  I;  Canadian Angina Classification:  Class 0: Asymptomatic;  Cardiology Procedures-Invasive:  no history of prior cardiac procedures;  Cardiology Procedures-Noninvasive:  treadmill cardiolite March 2016;  LVEF of 59% documented via nuclear study on 12/09/2014,   ____________________________ CARDIO-PULMONARY TEST DATES EKG Date:  09/04/2016;  Nuclear Study Date:   12/09/2014;  Chest Xray Date: 11/09/2014;   ____________________________ FAMILY HISTORY Brother -- Brother alive and well Brother -- Brother alive and well Father -- Father dead, Malignant neoplasm of lung Mother -- Mother dead, Carcinoma of breast, Alcoholism, Heart disease Sister -- Sister dead, Ovarian carcinoma Sister -- Sister alive with problem, Carcinoma of breast Sister -- Sister alive and well ____________________________ SOCIAL HISTORY Alcohol Use:  wine 1 per day;  Smoking:  used to smoke but quit Prior to 1980;  Diet:  regular diet;  Lifestyle:  married;  Exercise:  exercises regularly;  Occupation:  retired IT sales professional;  Residence:  lives with wife;   ____________________________ REVIEW OF SYSTEMS General:  denies recent weight change, fatique or change in exercise tolerance. Eyes: denies diplopia, history of glaucoma or visual problems. Respiratory: denies dyspnea, cough, wheezing or hemoptysis. Cardiovascular:  please review HPI Abdominal: history of GERD Genitourinary-Male: nocturia  Musculoskeletal:  arthritis of the neck and  knees Neurological:  denies headaches, stroke, or TIA  ____________________________ PHYSICAL EXAMINATION VITAL SIGNS  Blood Pressure:  120/70 Sitting, Left arm, regular cuff   Pulse:  60/min. Weight:  156.00 lbs. Height:  68"BMI: 24  Constitutional:  pleasant white male in no acute distress Skin:  warm and dry to touch, no apparent skin lesions, or masses noted. Head:  normocephalic, normal hair pattern, no masses or tenderness Eyes:  EOMS Intact, PERRLA, C and S clear, Funduscopic exam not done. ENT:  ears, nose and throat reveal no gross abnormalities.  Dentition good. Neck:  supple, without massess. No JVD, thyromegaly or carotid bruits. Carotid upstroke normal. Chest:  normal symmetry, clear to auscultation. Cardiac:  regular rhythm, normal S1 and S2, No S3 or S4, no murmurs, gallops or  rubs detected. Abdomen:  abdomen  soft,non-tender, no masses, no hepatospenomegaly, or aneurysm noted Peripheral Pulses:  the femoral,dorsalis pedis, and posterior tibial pulses are full and equal bilaterally with no bruits auscultated. Extremities & Back:  no deformities, clubbing, cyanosis, erythema or edema observed. Normal muscle strength and tone. Neurological:  no gross motor or sensory deficits noted, affect appropriate, oriented x3. ____________________________ MOST RECENT LIPID PANEL 11/29/15  CHOL TOTL 149 mg/dl, LDL 86 NM, HDL 52 mg/dl and TRIGLYCER 56 mg/dl ____________________________ IMPRESSIONS/PLAN  1. Chest pain consistent with angina with an abnormal exercise treadmill test 2. Hyperlipidemia currently treated 3. Esophageal reflux  RECOMMENDATIONS:  I discussed catheterization with the patient.Cardiac catheterization was discussed with the patient including risks of myocardial infarction, death, stroke, bleeding, arrhythmia, dye allergy, or renal insufficiency. He understands and is willing to proceed. Possibility of percutaneous intervention at the same setting was also discussed with the patient including risks. We discussed options of continued medical treatment since he has good exercise tolerance, stenting or bypass grafting if he has anatomy suitable for that for relief of symptoms.   ____________________________ Cardiology Physician:  Kerry Hough MD [facility]

## 2016-10-12 DIAGNOSIS — R310 Gross hematuria: Secondary | ICD-10-CM | POA: Diagnosis not present

## 2016-10-12 DIAGNOSIS — N401 Enlarged prostate with lower urinary tract symptoms: Secondary | ICD-10-CM | POA: Diagnosis not present

## 2016-10-12 DIAGNOSIS — R350 Frequency of micturition: Secondary | ICD-10-CM | POA: Diagnosis not present

## 2016-10-15 ENCOUNTER — Other Ambulatory Visit: Payer: Self-pay | Admitting: Cardiology

## 2016-10-15 ENCOUNTER — Telehealth: Payer: Self-pay | Admitting: Cardiovascular Disease

## 2016-10-15 DIAGNOSIS — I2510 Atherosclerotic heart disease of native coronary artery without angina pectoris: Secondary | ICD-10-CM

## 2016-10-15 NOTE — Telephone Encounter (Signed)
Spoke to patient. He's on cipro for a 2 week course. Has spoken w his urologist who advised that the med should not affect the procedure tomorrow, but wanted verification from cardiology. Informed him I would call him back w Dr. Lysbeth Penner advice once reviewed.

## 2016-10-15 NOTE — Telephone Encounter (Signed)
Low probability to affect the procedure but clearance has to come from MD performing procedure.

## 2016-10-15 NOTE — Telephone Encounter (Signed)
Pt advised, voiced thanks and understanding.

## 2016-10-15 NOTE — Telephone Encounter (Signed)
New Message    Having heart cath done 10/16/16 he is on antibiotics for UTI, will this effect the procedure

## 2016-10-15 NOTE — Telephone Encounter (Signed)
Antibiotic use should not be an issue.  Dr. Lemmie Evens

## 2016-10-15 NOTE — Telephone Encounter (Signed)
Will seek review for this inquiry from pharmD.

## 2016-10-15 NOTE — Telephone Encounter (Signed)
Unable to get in touch w patient at this time to confirm antibiotics. Routed to Dr. Debara Pickett for further guidance.

## 2016-10-16 ENCOUNTER — Encounter (HOSPITAL_COMMUNITY): Payer: Self-pay | Admitting: Interventional Cardiology

## 2016-10-16 ENCOUNTER — Ambulatory Visit (HOSPITAL_COMMUNITY)
Admission: RE | Admit: 2016-10-16 | Discharge: 2016-10-16 | Disposition: A | Discharge status: Home / Self Care | Payer: Medicare Other | Source: Ambulatory Visit | Attending: Cardiovascular Disease | Admitting: Cardiovascular Disease

## 2016-10-16 ENCOUNTER — Encounter (HOSPITAL_COMMUNITY)
Admission: RE | Disposition: A | Discharge status: Home / Self Care | Payer: Self-pay | Source: Ambulatory Visit | Attending: Cardiovascular Disease

## 2016-10-16 DIAGNOSIS — K219 Gastro-esophageal reflux disease without esophagitis: Secondary | ICD-10-CM | POA: Insufficient documentation

## 2016-10-16 DIAGNOSIS — Z8249 Family history of ischemic heart disease and other diseases of the circulatory system: Secondary | ICD-10-CM | POA: Insufficient documentation

## 2016-10-16 DIAGNOSIS — I209 Angina pectoris, unspecified: Secondary | ICD-10-CM | POA: Insufficient documentation

## 2016-10-16 DIAGNOSIS — Z801 Family history of malignant neoplasm of trachea, bronchus and lung: Secondary | ICD-10-CM | POA: Diagnosis not present

## 2016-10-16 DIAGNOSIS — I2510 Atherosclerotic heart disease of native coronary artery without angina pectoris: Secondary | ICD-10-CM

## 2016-10-16 DIAGNOSIS — R9431 Abnormal electrocardiogram [ECG] [EKG]: Secondary | ICD-10-CM

## 2016-10-16 DIAGNOSIS — Z8041 Family history of malignant neoplasm of ovary: Secondary | ICD-10-CM | POA: Diagnosis not present

## 2016-10-16 DIAGNOSIS — Z87891 Personal history of nicotine dependence: Secondary | ICD-10-CM | POA: Diagnosis not present

## 2016-10-16 DIAGNOSIS — M5090 Cervical disc disorder, unspecified, unspecified cervical region: Secondary | ICD-10-CM | POA: Insufficient documentation

## 2016-10-16 DIAGNOSIS — Z7982 Long term (current) use of aspirin: Secondary | ICD-10-CM | POA: Insufficient documentation

## 2016-10-16 DIAGNOSIS — N400 Enlarged prostate without lower urinary tract symptoms: Secondary | ICD-10-CM | POA: Diagnosis not present

## 2016-10-16 DIAGNOSIS — E785 Hyperlipidemia, unspecified: Secondary | ICD-10-CM | POA: Insufficient documentation

## 2016-10-16 DIAGNOSIS — Z803 Family history of malignant neoplasm of breast: Secondary | ICD-10-CM | POA: Diagnosis not present

## 2016-10-16 HISTORY — PX: CARDIAC CATHETERIZATION: SHX172

## 2016-10-16 LAB — CBC
HEMATOCRIT: 42.1 % (ref 39.0–52.0)
HEMOGLOBIN: 14.3 g/dL (ref 13.0–17.0)
MCH: 33 pg (ref 26.0–34.0)
MCHC: 34 g/dL (ref 30.0–36.0)
MCV: 97.2 fL (ref 78.0–100.0)
Platelets: 175 10*3/uL (ref 150–400)
RBC: 4.33 MIL/uL (ref 4.22–5.81)
RDW: 13.3 % (ref 11.5–15.5)
WBC: 4.3 10*3/uL (ref 4.0–10.5)

## 2016-10-16 LAB — BASIC METABOLIC PANEL
ANION GAP: 8 (ref 5–15)
BUN: 16 mg/dL (ref 6–20)
CHLORIDE: 105 mmol/L (ref 101–111)
CO2: 26 mmol/L (ref 22–32)
Calcium: 9.1 mg/dL (ref 8.9–10.3)
Creatinine, Ser: 0.77 mg/dL (ref 0.61–1.24)
GFR calc Af Amer: >60 mL/min (ref >60)
Glucose, Bld: 109 mg/dL — ABNORMAL HIGH (ref 65–99)
Potassium: 4 mmol/L (ref 3.5–5.1)
Sodium: 139 mmol/L (ref 135–145)

## 2016-10-16 LAB — PROTIME-INR
INR: 0.99
Prothrombin Time: 13.1 seconds (ref 11.4–15.2)

## 2016-10-16 SURGERY — LEFT HEART CATH AND CORONARY ANGIOGRAPHY
Anesthesia: LOCAL

## 2016-10-16 MED ORDER — SODIUM CHLORIDE 0.9 % IV SOLN: INTRAVENOUS | Status: AC

## 2016-10-16 MED ORDER — SODIUM CHLORIDE 0.9% FLUSH: 3.0000 mL | Freq: Two times a day (BID) | INTRAVENOUS | Status: DC

## 2016-10-16 MED ORDER — HEPARIN (PORCINE) IN NACL 2-0.9 UNIT/ML-% IJ SOLN
INTRAMUSCULAR | Status: DC | PRN
  Administered 2016-10-16: 1000 mL

## 2016-10-16 MED ORDER — MIDAZOLAM HCL 2 MG/2ML IJ SOLN
INTRAMUSCULAR | Status: AC
  Filled 2016-10-16: qty 2

## 2016-10-16 MED ORDER — IOPAMIDOL (ISOVUE-370) INJECTION 76%
INTRAVENOUS | Status: AC
  Filled 2016-10-16: qty 100

## 2016-10-16 MED ORDER — HEPARIN SODIUM (PORCINE) 1000 UNIT/ML IJ SOLN
INTRAMUSCULAR | Status: AC
  Filled 2016-10-16: qty 1

## 2016-10-16 MED ORDER — SODIUM CHLORIDE 0.9% FLUSH: 3.0000 mL | INTRAVENOUS | Status: DC | PRN

## 2016-10-16 MED ORDER — LIDOCAINE HCL (PF) 1 % IJ SOLN
INTRAMUSCULAR | Status: DC | PRN
  Administered 2016-10-16: 3 mL via SUBCUTANEOUS

## 2016-10-16 MED ORDER — SODIUM CHLORIDE 0.9 % IV SOLN: 250.0000 mL | INTRAVENOUS | Status: DC | PRN

## 2016-10-16 MED ORDER — VERAPAMIL HCL 2.5 MG/ML IV SOLN
INTRAVENOUS | Status: AC
  Filled 2016-10-16: qty 2

## 2016-10-16 MED ORDER — IOPAMIDOL (ISOVUE-370) INJECTION 76%
INTRAVENOUS | Status: DC | PRN
Start: 2016-10-16 — End: 2016-10-16
  Administered 2016-10-16: 75 mL via INTRA_ARTERIAL

## 2016-10-16 MED ORDER — VERAPAMIL HCL 2.5 MG/ML IV SOLN
INTRAVENOUS | Status: DC | PRN
  Administered 2016-10-16: 10 mL via INTRA_ARTERIAL

## 2016-10-16 MED ORDER — MIDAZOLAM HCL 2 MG/2ML IJ SOLN
INTRAMUSCULAR | Status: DC | PRN
  Administered 2016-10-16: 2 mg via INTRAVENOUS

## 2016-10-16 MED ORDER — FENTANYL CITRATE (PF) 100 MCG/2ML IJ SOLN
INTRAMUSCULAR | Status: AC
  Filled 2016-10-16: qty 2

## 2016-10-16 MED ORDER — HEPARIN SODIUM (PORCINE) 1000 UNIT/ML IJ SOLN
INTRAMUSCULAR | Status: DC | PRN
  Administered 2016-10-16: 3500 [IU] via INTRAVENOUS

## 2016-10-16 MED ORDER — FENTANYL CITRATE (PF) 100 MCG/2ML IJ SOLN
INTRAMUSCULAR | Status: DC | PRN
  Administered 2016-10-16: 25 ug via INTRAVENOUS

## 2016-10-16 MED ORDER — SODIUM CHLORIDE 0.9 % IV SOLN
250.0000 mL | INTRAVENOUS | Status: DC | PRN
Start: 2016-10-16 — End: 2016-10-16

## 2016-10-16 MED ORDER — LIDOCAINE HCL (PF) 1 % IJ SOLN
INTRAMUSCULAR | Status: AC
  Filled 2016-10-16: qty 30

## 2016-10-16 MED ORDER — HEPARIN (PORCINE) IN NACL 2-0.9 UNIT/ML-% IJ SOLN
INTRAMUSCULAR | Status: AC
  Filled 2016-10-16: qty 1000

## 2016-10-16 MED ORDER — ASPIRIN 81 MG PO CHEW
CHEWABLE_TABLET | ORAL | Status: AC
  Administered 2016-10-16: 81 mg
  Filled 2016-10-16: qty 1

## 2016-10-16 MED ORDER — SODIUM CHLORIDE 0.9 % IV SOLN
INTRAVENOUS | Status: DC
  Administered 2016-10-16: 08:00:00 via INTRAVENOUS

## 2016-10-16 SURGICAL SUPPLY — 10 items

## 2016-10-16 NOTE — Discharge Instructions (Signed)

## 2016-10-16 NOTE — Interval H&P Note (Signed)
Cath Lab Visit (complete for each Cath Lab visit)  Clinical Evaluation Leading to the Procedure:   ACS: No.  Non-ACS:    Anginal Classification: CCS III  Anti-ischemic medical therapy: Minimal Therapy (1 class of medications)  Non-Invasive Test Results: Intermediate-risk stress test findings: cardiac mortality 1-3%/year  Prior CABG: No previous CABG      History and Physical Interval Note:  10/16/2016 9:35 AM  Brandon Acosta  has presented today for surgery, with the diagnosis of chest pain  The various methods of treatment have been discussed with the patient and family. After consideration of risks, benefits and other options for treatment, the patient has consented to  Procedure(s): Left Heart Cath and Coronary Angiography (N/A) as a surgical intervention .  The patient's history has been reviewed, patient examined, no change in status, stable for surgery.  I have reviewed the patient's chart and labs.  Questions were answered to the patient's satisfaction.     Larae Grooms

## 2016-10-16 NOTE — H&P (View-Only) (Signed)
Brandon Acosta, Brandon Acosta  Date of visit:  09/28/2016 DOB:  [birth year]    Age:  76 yrs. Medical record number:  [id number]     Account number:  [id number] Primary Care Provider: [name] ____________________________ CURRENT DIAGNOSES  1. Angina pectoris, unspecified  2. Gastro-esophageal reflux disease  3. Hyperlipidemia ____________________________ ALLERGIES  No Known Allergies ____________________________ MEDICATIONS  1. Pepcid AC 20 mg tablet, PRN  2. Vitamin D3 1,000 unit tablet, 1 p.o. daily  3. aspirin 81 mg chewable tablet, 1 p.o. daily  4. nitroglycerin 0.4 mg sublingual tablet, PRN  5. simvastatin 40 mg tablet, three times a week  6. tamsulosin 0.4 mg capsule, 1 p.o. daily  7. metoprolol succinate ER 25 mg tablet,extended release 24 hr, 1 p.o. daily ____________________________ HISTORY OF PRESENT ILLNESS Patient has had recurrent angina usually with walking and cold air and does note discomfort with exercise. He had a negative myocardial perfusion scan although associated with ST depression over a year and a half ago. He has noted increasing frequency and severity of symptoms and had an exercise treadmill test today that was clinically positive for angina and Bruce stage III along with 2-3 mm of ST segment depression in the lateral leads. His risk factors include hyperlipidemia which is currently treated. He denies PND, orthopnea or claudication. ____________________________ PAST HISTORY  Past Medical Illnesses:  hyperlipidemia, GERD, BPH, cervical disc disease;  Cardiovascular Illnesses:  angina;  NYHA Classification:  I;  Canadian Angina Classification:  Class 0: Asymptomatic;  Cardiology Procedures-Invasive:  no history of prior cardiac procedures;  Cardiology Procedures-Noninvasive:  treadmill cardiolite March 2016;  LVEF of 59% documented via nuclear study on 12/09/2014,   ____________________________ CARDIO-PULMONARY TEST DATES EKG Date:  09/04/2016;  Nuclear Study Date:   12/09/2014;  Chest Xray Date: 11/09/2014;   ____________________________ FAMILY HISTORY Brother -- Brother alive and well Brother -- Brother alive and well Father -- Father dead, Malignant neoplasm of lung Mother -- Mother dead, Carcinoma of breast, Alcoholism, Heart disease Sister -- Sister dead, Ovarian carcinoma Sister -- Sister alive with problem, Carcinoma of breast Sister -- Sister alive and well ____________________________ SOCIAL HISTORY Alcohol Use:  wine 1 per day;  Smoking:  used to smoke but quit Prior to 1980;  Diet:  regular diet;  Lifestyle:  married;  Exercise:  exercises regularly;  Occupation:  retired IT sales professional;  Residence:  lives with wife;   ____________________________ REVIEW OF SYSTEMS General:  denies recent weight change, fatique or change in exercise tolerance. Eyes: denies diplopia, history of glaucoma or visual problems. Respiratory: denies dyspnea, cough, wheezing or hemoptysis. Cardiovascular:  please review HPI Abdominal: history of GERD Genitourinary-Male: nocturia  Musculoskeletal:  arthritis of the neck and  knees Neurological:  denies headaches, stroke, or TIA  ____________________________ PHYSICAL EXAMINATION VITAL SIGNS  Blood Pressure:  120/70 Sitting, Left arm, regular cuff   Pulse:  60/min. Weight:  156.00 lbs. Height:  68"BMI: 24  Constitutional:  pleasant white male in no acute distress Skin:  warm and dry to touch, no apparent skin lesions, or masses noted. Head:  normocephalic, normal hair pattern, no masses or tenderness Eyes:  EOMS Intact, PERRLA, C and S clear, Funduscopic exam not done. ENT:  ears, nose and throat reveal no gross abnormalities.  Dentition good. Neck:  supple, without massess. No JVD, thyromegaly or carotid bruits. Carotid upstroke normal. Chest:  normal symmetry, clear to auscultation. Cardiac:  regular rhythm, normal S1 and S2, No S3 or S4, no murmurs, gallops or  rubs detected. Abdomen:  abdomen  soft,non-tender, no masses, no hepatospenomegaly, or aneurysm noted Peripheral Pulses:  the femoral,dorsalis pedis, and posterior tibial pulses are full and equal bilaterally with no bruits auscultated. Extremities & Back:  no deformities, clubbing, cyanosis, erythema or edema observed. Normal muscle strength and tone. Neurological:  no gross motor or sensory deficits noted, affect appropriate, oriented x3. ____________________________ MOST RECENT LIPID PANEL 11/29/15  CHOL TOTL 149 mg/dl, LDL 86 NM, HDL 52 mg/dl and TRIGLYCER 56 mg/dl ____________________________ IMPRESSIONS/PLAN  1. Chest pain consistent with angina with an abnormal exercise treadmill test 2. Hyperlipidemia currently treated 3. Esophageal reflux  RECOMMENDATIONS:  I discussed catheterization with the patient.Cardiac catheterization was discussed with the patient including risks of myocardial infarction, death, stroke, bleeding, arrhythmia, dye allergy, or renal insufficiency. He understands and is willing to proceed. Possibility of percutaneous intervention at the same setting was also discussed with the patient including risks. We discussed options of continued medical treatment since he has good exercise tolerance, stenting or bypass grafting if he has anatomy suitable for that for relief of symptoms.   ____________________________ Cardiology Physician:  Kerry Hough MD [facility]

## 2016-10-24 DIAGNOSIS — E784 Other hyperlipidemia: Secondary | ICD-10-CM | POA: Diagnosis not present

## 2016-10-24 DIAGNOSIS — K219 Gastro-esophageal reflux disease without esophagitis: Secondary | ICD-10-CM | POA: Diagnosis not present

## 2016-10-24 DIAGNOSIS — I209 Angina pectoris, unspecified: Secondary | ICD-10-CM | POA: Diagnosis not present

## 2016-11-12 DIAGNOSIS — K219 Gastro-esophageal reflux disease without esophagitis: Secondary | ICD-10-CM | POA: Diagnosis not present

## 2016-11-12 DIAGNOSIS — I209 Angina pectoris, unspecified: Secondary | ICD-10-CM | POA: Diagnosis not present

## 2016-11-12 DIAGNOSIS — E784 Other hyperlipidemia: Secondary | ICD-10-CM | POA: Diagnosis not present

## 2016-12-17 DIAGNOSIS — Z79899 Other long term (current) drug therapy: Secondary | ICD-10-CM | POA: Diagnosis not present

## 2016-12-17 DIAGNOSIS — E785 Hyperlipidemia, unspecified: Secondary | ICD-10-CM | POA: Diagnosis not present

## 2016-12-17 DIAGNOSIS — M810 Age-related osteoporosis without current pathological fracture: Secondary | ICD-10-CM | POA: Diagnosis not present

## 2016-12-17 DIAGNOSIS — Z125 Encounter for screening for malignant neoplasm of prostate: Secondary | ICD-10-CM | POA: Diagnosis not present

## 2016-12-20 DIAGNOSIS — Z0000 Encounter for general adult medical examination without abnormal findings: Secondary | ICD-10-CM | POA: Diagnosis not present

## 2016-12-20 DIAGNOSIS — M810 Age-related osteoporosis without current pathological fracture: Secondary | ICD-10-CM | POA: Diagnosis not present

## 2016-12-20 DIAGNOSIS — Z79899 Other long term (current) drug therapy: Secondary | ICD-10-CM | POA: Diagnosis not present

## 2016-12-20 DIAGNOSIS — Z86010 Personal history of colonic polyps: Secondary | ICD-10-CM | POA: Diagnosis not present

## 2016-12-20 DIAGNOSIS — I2510 Atherosclerotic heart disease of native coronary artery without angina pectoris: Secondary | ICD-10-CM | POA: Diagnosis not present

## 2016-12-20 DIAGNOSIS — R7301 Impaired fasting glucose: Secondary | ICD-10-CM | POA: Diagnosis not present

## 2016-12-20 DIAGNOSIS — N401 Enlarged prostate with lower urinary tract symptoms: Secondary | ICD-10-CM | POA: Diagnosis not present

## 2016-12-20 DIAGNOSIS — E785 Hyperlipidemia, unspecified: Secondary | ICD-10-CM | POA: Diagnosis not present

## 2016-12-20 DIAGNOSIS — R972 Elevated prostate specific antigen [PSA]: Secondary | ICD-10-CM | POA: Diagnosis not present

## 2017-01-23 DIAGNOSIS — N3001 Acute cystitis with hematuria: Secondary | ICD-10-CM | POA: Diagnosis not present

## 2017-01-23 DIAGNOSIS — R310 Gross hematuria: Secondary | ICD-10-CM | POA: Diagnosis not present

## 2017-01-23 DIAGNOSIS — R972 Elevated prostate specific antigen [PSA]: Secondary | ICD-10-CM | POA: Diagnosis not present

## 2017-03-08 DIAGNOSIS — R0789 Other chest pain: Secondary | ICD-10-CM | POA: Diagnosis not present

## 2017-03-11 DIAGNOSIS — K219 Gastro-esophageal reflux disease without esophagitis: Secondary | ICD-10-CM | POA: Diagnosis not present

## 2017-03-11 DIAGNOSIS — R0789 Other chest pain: Secondary | ICD-10-CM | POA: Diagnosis not present

## 2017-03-11 DIAGNOSIS — I209 Angina pectoris, unspecified: Secondary | ICD-10-CM | POA: Diagnosis not present

## 2017-03-11 DIAGNOSIS — E784 Other hyperlipidemia: Secondary | ICD-10-CM | POA: Diagnosis not present

## 2017-04-09 ENCOUNTER — Other Ambulatory Visit: Payer: Self-pay | Admitting: Gastroenterology

## 2017-04-09 DIAGNOSIS — R0789 Other chest pain: Secondary | ICD-10-CM | POA: Diagnosis not present

## 2017-04-09 DIAGNOSIS — R079 Chest pain, unspecified: Secondary | ICD-10-CM

## 2017-04-12 ENCOUNTER — Ambulatory Visit
Admission: RE | Admit: 2017-04-12 | Discharge: 2017-04-12 | Disposition: A | Discharge status: Home / Self Care | Payer: Medicare Other | Source: Ambulatory Visit | Attending: Gastroenterology | Admitting: Gastroenterology

## 2017-04-12 DIAGNOSIS — R079 Chest pain, unspecified: Secondary | ICD-10-CM

## 2017-04-12 DIAGNOSIS — R4702 Dysphasia: Secondary | ICD-10-CM | POA: Diagnosis not present

## 2017-05-14 DIAGNOSIS — K219 Gastro-esophageal reflux disease without esophagitis: Secondary | ICD-10-CM | POA: Diagnosis not present

## 2017-05-14 DIAGNOSIS — E784 Other hyperlipidemia: Secondary | ICD-10-CM | POA: Diagnosis not present

## 2017-05-14 DIAGNOSIS — I209 Angina pectoris, unspecified: Secondary | ICD-10-CM | POA: Diagnosis not present

## 2017-05-14 DIAGNOSIS — R0789 Other chest pain: Secondary | ICD-10-CM | POA: Diagnosis not present

## 2017-06-03 DIAGNOSIS — H353131 Nonexudative age-related macular degeneration, bilateral, early dry stage: Secondary | ICD-10-CM | POA: Diagnosis not present

## 2017-06-03 DIAGNOSIS — H524 Presbyopia: Secondary | ICD-10-CM | POA: Diagnosis not present

## 2017-06-03 DIAGNOSIS — H2513 Age-related nuclear cataract, bilateral: Secondary | ICD-10-CM | POA: Diagnosis not present

## 2017-06-06 DIAGNOSIS — K219 Gastro-esophageal reflux disease without esophagitis: Secondary | ICD-10-CM | POA: Diagnosis not present

## 2017-06-06 DIAGNOSIS — R0789 Other chest pain: Secondary | ICD-10-CM | POA: Diagnosis not present

## 2017-08-30 DIAGNOSIS — K219 Gastro-esophageal reflux disease without esophagitis: Secondary | ICD-10-CM | POA: Diagnosis not present

## 2017-08-30 DIAGNOSIS — R0789 Other chest pain: Secondary | ICD-10-CM | POA: Diagnosis not present

## 2017-11-24 DIAGNOSIS — J0110 Acute frontal sinusitis, unspecified: Secondary | ICD-10-CM | POA: Diagnosis not present

## 2017-11-24 DIAGNOSIS — B349 Viral infection, unspecified: Secondary | ICD-10-CM | POA: Diagnosis not present

## 2017-12-16 DIAGNOSIS — J069 Acute upper respiratory infection, unspecified: Secondary | ICD-10-CM | POA: Diagnosis not present

## 2017-12-17 DIAGNOSIS — M899 Disorder of bone, unspecified: Secondary | ICD-10-CM | POA: Diagnosis not present

## 2017-12-17 DIAGNOSIS — Z79899 Other long term (current) drug therapy: Secondary | ICD-10-CM | POA: Diagnosis not present

## 2017-12-17 DIAGNOSIS — Z125 Encounter for screening for malignant neoplasm of prostate: Secondary | ICD-10-CM | POA: Diagnosis not present

## 2017-12-17 DIAGNOSIS — E785 Hyperlipidemia, unspecified: Secondary | ICD-10-CM | POA: Diagnosis not present

## 2017-12-25 DIAGNOSIS — Z79899 Other long term (current) drug therapy: Secondary | ICD-10-CM | POA: Diagnosis not present

## 2017-12-25 DIAGNOSIS — Z1389 Encounter for screening for other disorder: Secondary | ICD-10-CM | POA: Diagnosis not present

## 2017-12-25 DIAGNOSIS — R8290 Unspecified abnormal findings in urine: Secondary | ICD-10-CM | POA: Diagnosis not present

## 2017-12-25 DIAGNOSIS — R7301 Impaired fasting glucose: Secondary | ICD-10-CM | POA: Diagnosis not present

## 2017-12-25 DIAGNOSIS — Z86010 Personal history of colonic polyps: Secondary | ICD-10-CM | POA: Diagnosis not present

## 2017-12-25 DIAGNOSIS — N401 Enlarged prostate with lower urinary tract symptoms: Secondary | ICD-10-CM | POA: Diagnosis not present

## 2017-12-25 DIAGNOSIS — E785 Hyperlipidemia, unspecified: Secondary | ICD-10-CM | POA: Diagnosis not present

## 2017-12-25 DIAGNOSIS — R972 Elevated prostate specific antigen [PSA]: Secondary | ICD-10-CM | POA: Diagnosis not present

## 2017-12-25 DIAGNOSIS — Z0000 Encounter for general adult medical examination without abnormal findings: Secondary | ICD-10-CM | POA: Diagnosis not present

## 2017-12-25 DIAGNOSIS — K219 Gastro-esophageal reflux disease without esophagitis: Secondary | ICD-10-CM | POA: Diagnosis not present

## 2017-12-25 DIAGNOSIS — M810 Age-related osteoporosis without current pathological fracture: Secondary | ICD-10-CM | POA: Diagnosis not present

## 2017-12-25 DIAGNOSIS — Z8709 Personal history of other diseases of the respiratory system: Secondary | ICD-10-CM | POA: Diagnosis not present

## 2018-02-05 DIAGNOSIS — R972 Elevated prostate specific antigen [PSA]: Secondary | ICD-10-CM | POA: Diagnosis not present

## 2018-02-05 DIAGNOSIS — R3912 Poor urinary stream: Secondary | ICD-10-CM | POA: Diagnosis not present

## 2018-02-05 DIAGNOSIS — N401 Enlarged prostate with lower urinary tract symptoms: Secondary | ICD-10-CM | POA: Diagnosis not present

## 2018-03-12 DIAGNOSIS — R51 Headache: Secondary | ICD-10-CM | POA: Diagnosis not present

## 2018-04-02 DIAGNOSIS — E785 Hyperlipidemia, unspecified: Secondary | ICD-10-CM | POA: Diagnosis not present

## 2018-04-02 DIAGNOSIS — Z79899 Other long term (current) drug therapy: Secondary | ICD-10-CM | POA: Diagnosis not present

## 2018-04-02 DIAGNOSIS — K219 Gastro-esophageal reflux disease without esophagitis: Secondary | ICD-10-CM | POA: Diagnosis not present

## 2018-04-02 DIAGNOSIS — N401 Enlarged prostate with lower urinary tract symptoms: Secondary | ICD-10-CM | POA: Diagnosis not present

## 2018-04-02 DIAGNOSIS — R51 Headache: Secondary | ICD-10-CM | POA: Diagnosis not present

## 2018-04-02 DIAGNOSIS — R8290 Unspecified abnormal findings in urine: Secondary | ICD-10-CM | POA: Diagnosis not present

## 2018-04-02 DIAGNOSIS — R7301 Impaired fasting glucose: Secondary | ICD-10-CM | POA: Diagnosis not present

## 2018-04-02 DIAGNOSIS — Z8709 Personal history of other diseases of the respiratory system: Secondary | ICD-10-CM | POA: Diagnosis not present

## 2018-04-02 DIAGNOSIS — R972 Elevated prostate specific antigen [PSA]: Secondary | ICD-10-CM | POA: Diagnosis not present

## 2018-04-02 DIAGNOSIS — Z86010 Personal history of colonic polyps: Secondary | ICD-10-CM | POA: Diagnosis not present

## 2018-04-02 DIAGNOSIS — M810 Age-related osteoporosis without current pathological fracture: Secondary | ICD-10-CM | POA: Diagnosis not present

## 2018-05-01 ENCOUNTER — Telehealth: Payer: Self-pay | Admitting: Neurology

## 2018-05-01 ENCOUNTER — Ambulatory Visit (INDEPENDENT_AMBULATORY_CARE_PROVIDER_SITE_OTHER): Payer: Medicare Other | Admitting: Neurology

## 2018-05-01 ENCOUNTER — Encounter: Payer: Self-pay | Admitting: Neurology

## 2018-05-01 ENCOUNTER — Other Ambulatory Visit: Payer: Self-pay

## 2018-05-01 VITALS — BP 100/60 | HR 53 | Wt 152.0 lb

## 2018-05-01 DIAGNOSIS — G500 Trigeminal neuralgia: Secondary | ICD-10-CM | POA: Diagnosis not present

## 2018-05-01 DIAGNOSIS — G4489 Other headache syndrome: Secondary | ICD-10-CM

## 2018-05-01 NOTE — Patient Instructions (Signed)
    We will check MRI of the brain. 

## 2018-05-01 NOTE — Progress Notes (Signed)
Reason for visit: Headache  Referring physician: Dr. Johny Sax Brandon Acosta is a 77 y.o. male  History of present illness:  Brandon Acosta is a 77 year old right-handed white male with a history of onset of sharp shooting pains in the left supraorbital area that began in October 2018.  The patient had only a handful of events at that time, and then he had no symptoms whatsoever until early May 2019.  Over the next several months until mid July 2019, the patient was having relatively frequent events, at times he may have several events during the day.  The patient would note an electric shock type sensation that would last only a few seconds and then disappear.  The patient cannot activate the pain by palpation of the brow.  He reports no trauma to this area and he does not correlate his sinus issues with the onset of the pain.  He does have frequent bouts of sinusitis, the last such event was in February 2019.  The patient does have some cervical arthritis, he denies any severe occipital headaches associated with the neck problem.  He denies any significant numbness or weakness of the face, arms, legs.  He is at times he may wake up with some numbness in the ulnar aspect of the right hand in the morning.  The patient has no troubles with balance or difficulty controlling the bowels or the bladder.  He denies any double vision, visual change, difficulty with speech or swallowing.  He is sent to this office for an evaluation.  Sedimentation rate was checked, this was 11.  Past Medical History:  Diagnosis Date  . Arthritis   . Diverticulitis   . GERD (gastroesophageal reflux disease)   . Hyperlipemia   . Osteoporosis     Past Surgical History:  Procedure Laterality Date  . APPENDECTOMY  as child  . CARDIAC CATHETERIZATION N/A 10/16/2016   Procedure: Left Heart Cath and Coronary Angiography;  Surgeon: Jettie Booze, MD;  Location: [facility];  Service: Cardiovascular;   Laterality: N/A;  . COLONOSCOPY WITH PROPOFOL N/A 02/11/2014   Procedure: COLONOSCOPY WITH PROPOFOL;  Surgeon: Cleotis Nipper, MD;  Location: [facility];  Service: Endoscopy;  Laterality: N/A;  . KNEE ARTHROSCOPY Right 2013   torn meniscus repair    History reviewed. No pertinent family history.  Social history:  reports that he quit smoking about 41 years ago. His smoking use included cigarettes. He has a 10.00 pack-year smoking history. He has never used smokeless tobacco. He reports that he drinks alcohol. He reports that he does not use drugs.  Medications:  Prior to Admission medications   Medication Sig Start Date End Date Taking? Authorizing Provider  aspirin 81 MG tablet Take 81 mg by mouth every evening.    Yes [provider]  cholecalciferol (VITAMIN D) 1000 UNITS tablet Take 1,000 Units by mouth every evening.    Yes [provider]  ibuprofen (ADVIL,MOTRIN) 200 MG tablet Take 400 mg by mouth daily as needed for moderate pain.   Yes [provider]  omeprazole (PRILOSEC) 20 MG capsule Take 20 mg by mouth daily.   Yes [provider]  oxymetazoline (AFRIN) 0.05 % nasal spray Place 1 spray into both nostrils 2 (two) times daily as needed for congestion.   Yes [provider]  simvastatin (ZOCOR) 40 MG tablet Take 40 mg by mouth 3 (three) times a week.   Yes [provider]  tamsulosin (FLOMAX) 0.4  MG CAPS capsule Take 0.4 mg by mouth every evening. 08/24/16  Yes [provider]     No Known Allergies  ROS:  Out of a complete 14 system review of symptoms, the patient complains only of the following symptoms, and all other reviewed systems are negative.  Neck pain Hand tingling  Blood pressure 100/60, pulse (!) 53, weight 152 lb (68.9 kg), SpO2 98 %.  Physical Exam  General: The patient is alert and cooperative at the time of the examination.  Eyes: Pupils are equal, round, and reactive to light. Discs are  flat bilaterally.  Neck: The neck is supple, no carotid bruits are noted.  Respiratory: The respiratory examination is clear.  Cardiovascular: The cardiovascular examination reveals a regular rate and rhythm, no obvious murmurs or rubs are noted.  Skin: Extremities are without significant edema.  Neurologic Exam  Mental status: The patient is alert and oriented x 3 at the time of the examination. The patient has apparent normal recent and remote memory, with an apparently normal attention span and concentration ability.  Cranial nerves: Facial symmetry is present. There is good sensation of the face to pinprick and soft touch bilaterally. The strength of the facial muscles and the muscles to head turning and shoulder shrug are normal bilaterally. Speech is well enunciated, no aphasia or dysarthria is noted. Extraocular movements are full. Visual fields are full. The tongue is midline, and the patient has symmetric elevation of the soft palate. No obvious hearing deficits are noted.  Motor: The motor testing reveals 5 over 5 strength of all 4 extremities. Good symmetric motor tone is noted throughout.  Sensory: Sensory testing is intact to pinprick, soft touch, vibration sensation, and position sense on all 4 extremities. No evidence of extinction is noted.  Coordination: Cerebellar testing reveals good finger-nose-finger and heel-to-shin bilaterally.  Gait and station: Gait is normal. Tandem gait is normal. Romberg is negative. No drift is seen.  Reflexes: Deep tendon reflexes are symmetric and normal bilaterally. Toes are downgoing bilaterally.   Assessment/Plan:  1.  Left supraorbital neuralgia  The patient appears to have symptoms consistent with a supraorbital neuralgia, the etiology of this is not clear.  There is no history of trauma, the patient does not correlate the symptoms with his sinus disease.  The patient will be set up for MRI of the brain, if this is unremarkable, will  follow conservatively.  The patient will contact our office if the pain returns, we may consider medications or a supraorbital nerve block.  Jill Alexanders MD 05/01/2018 9:03 AM  [facility] [address] [place], [place] [address]  Phone [phone] Fax [phone]

## 2018-05-01 NOTE — Telephone Encounter (Signed)
Medicare/bcbs supp order sent to GI. No auth they will reach out to the pt to schedule.  °

## 2018-05-05 ENCOUNTER — Ambulatory Visit
Admission: RE | Admit: 2018-05-05 | Discharge: 2018-05-05 | Disposition: A | Discharge status: Home / Self Care | Payer: Medicare Other | Source: Ambulatory Visit | Attending: Neurology | Admitting: Neurology

## 2018-05-05 ENCOUNTER — Telehealth: Payer: Self-pay | Admitting: Neurology

## 2018-05-05 DIAGNOSIS — G500 Trigeminal neuralgia: Secondary | ICD-10-CM

## 2018-05-05 DIAGNOSIS — G4489 Other headache syndrome: Secondary | ICD-10-CM | POA: Diagnosis not present

## 2018-05-05 DIAGNOSIS — R51 Headache: Secondary | ICD-10-CM | POA: Diagnosis not present

## 2018-05-05 NOTE — Telephone Encounter (Signed)
I called the patient.  No source of the supraorbital neuralgia on the left.  No significant frontal sinus disease.  If the pain returns and is severe, the patient is to contact our office.   MRI brain 05/05/18:  IMPRESSION: This MRI of the brain without contrast shows the following: 1.    There are no acute findings. 2.    Mild generalized cortical atrophy that has progressed compared to the 2011 MRI 3.    Brain parenchyma appears normal for age.

## 2018-06-05 DIAGNOSIS — H1132 Conjunctival hemorrhage, left eye: Secondary | ICD-10-CM | POA: Diagnosis not present

## 2018-06-12 DIAGNOSIS — H33301 Unspecified retinal break, right eye: Secondary | ICD-10-CM | POA: Diagnosis not present

## 2018-06-12 DIAGNOSIS — H2513 Age-related nuclear cataract, bilateral: Secondary | ICD-10-CM | POA: Diagnosis not present

## 2018-06-12 DIAGNOSIS — H5203 Hypermetropia, bilateral: Secondary | ICD-10-CM | POA: Diagnosis not present

## 2018-07-23 ENCOUNTER — Telehealth: Payer: Self-pay | Admitting: Cardiology

## 2018-07-28 NOTE — Telephone Encounter (Signed)
done

## 2018-09-03 DIAGNOSIS — N3000 Acute cystitis without hematuria: Secondary | ICD-10-CM | POA: Diagnosis not present

## 2018-09-04 ENCOUNTER — Other Ambulatory Visit: Payer: Self-pay

## 2018-09-04 ENCOUNTER — Inpatient Hospital Stay (HOSPITAL_COMMUNITY)
Admission: EM | Admit: 2018-09-04 | Discharge: 2018-09-08 | DRG: 872 | Disposition: A | Discharge status: Home / Self Care | Payer: Medicare Other | Attending: Internal Medicine | Admitting: Internal Medicine

## 2018-09-04 ENCOUNTER — Encounter (HOSPITAL_COMMUNITY): Payer: Self-pay

## 2018-09-04 ENCOUNTER — Emergency Department (HOSPITAL_COMMUNITY): Payer: Medicare Other

## 2018-09-04 DIAGNOSIS — E876 Hypokalemia: Secondary | ICD-10-CM | POA: Diagnosis present

## 2018-09-04 DIAGNOSIS — N400 Enlarged prostate without lower urinary tract symptoms: Secondary | ICD-10-CM | POA: Diagnosis present

## 2018-09-04 DIAGNOSIS — A419 Sepsis, unspecified organism: Secondary | ICD-10-CM | POA: Diagnosis not present

## 2018-09-04 DIAGNOSIS — M810 Age-related osteoporosis without current pathological fracture: Secondary | ICD-10-CM | POA: Diagnosis present

## 2018-09-04 DIAGNOSIS — E785 Hyperlipidemia, unspecified: Secondary | ICD-10-CM | POA: Diagnosis present

## 2018-09-04 DIAGNOSIS — D696 Thrombocytopenia, unspecified: Secondary | ICD-10-CM | POA: Diagnosis present

## 2018-09-04 DIAGNOSIS — Z1611 Resistance to penicillins: Secondary | ICD-10-CM | POA: Diagnosis present

## 2018-09-04 DIAGNOSIS — N419 Inflammatory disease of prostate, unspecified: Secondary | ICD-10-CM | POA: Diagnosis present

## 2018-09-04 DIAGNOSIS — E860 Dehydration: Secondary | ICD-10-CM | POA: Diagnosis present

## 2018-09-04 DIAGNOSIS — N390 Urinary tract infection, site not specified: Secondary | ICD-10-CM | POA: Diagnosis present

## 2018-09-04 DIAGNOSIS — E861 Hypovolemia: Secondary | ICD-10-CM | POA: Diagnosis present

## 2018-09-04 DIAGNOSIS — A498 Other bacterial infections of unspecified site: Secondary | ICD-10-CM | POA: Diagnosis not present

## 2018-09-04 DIAGNOSIS — Z87891 Personal history of nicotine dependence: Secondary | ICD-10-CM | POA: Diagnosis not present

## 2018-09-04 DIAGNOSIS — R7881 Bacteremia: Secondary | ICD-10-CM | POA: Diagnosis not present

## 2018-09-04 DIAGNOSIS — D649 Anemia, unspecified: Secondary | ICD-10-CM | POA: Diagnosis present

## 2018-09-04 DIAGNOSIS — R61 Generalized hyperhidrosis: Secondary | ICD-10-CM | POA: Diagnosis not present

## 2018-09-04 DIAGNOSIS — K219 Gastro-esophageal reflux disease without esophagitis: Secondary | ICD-10-CM | POA: Diagnosis present

## 2018-09-04 DIAGNOSIS — R740 Nonspecific elevation of levels of transaminase and lactic acid dehydrogenase [LDH]: Secondary | ICD-10-CM | POA: Diagnosis present

## 2018-09-04 DIAGNOSIS — N4282 Prostatosis syndrome: Secondary | ICD-10-CM | POA: Diagnosis present

## 2018-09-04 DIAGNOSIS — R0902 Hypoxemia: Secondary | ICD-10-CM | POA: Diagnosis not present

## 2018-09-04 DIAGNOSIS — Z7982 Long term (current) use of aspirin: Secondary | ICD-10-CM | POA: Diagnosis not present

## 2018-09-04 DIAGNOSIS — Z79899 Other long term (current) drug therapy: Secondary | ICD-10-CM | POA: Diagnosis not present

## 2018-09-04 DIAGNOSIS — Z87440 Personal history of urinary (tract) infections: Secondary | ICD-10-CM

## 2018-09-04 DIAGNOSIS — I959 Hypotension, unspecified: Secondary | ICD-10-CM | POA: Diagnosis not present

## 2018-09-04 DIAGNOSIS — A4159 Other Gram-negative sepsis: Secondary | ICD-10-CM | POA: Diagnosis not present

## 2018-09-04 DIAGNOSIS — R310 Gross hematuria: Secondary | ICD-10-CM | POA: Diagnosis present

## 2018-09-04 DIAGNOSIS — J984 Other disorders of lung: Secondary | ICD-10-CM | POA: Diagnosis not present

## 2018-09-04 DIAGNOSIS — R319 Hematuria, unspecified: Secondary | ICD-10-CM | POA: Diagnosis present

## 2018-09-04 DIAGNOSIS — R531 Weakness: Secondary | ICD-10-CM | POA: Diagnosis not present

## 2018-09-04 DIAGNOSIS — E872 Acidosis: Secondary | ICD-10-CM | POA: Diagnosis present

## 2018-09-04 DIAGNOSIS — R000 Tachycardia, unspecified: Secondary | ICD-10-CM | POA: Diagnosis not present

## 2018-09-04 LAB — URINALYSIS, ROUTINE W REFLEX MICROSCOPIC
Bacteria, UA: NONE SEEN
Bilirubin Urine: NEGATIVE
GLUCOSE, UA: NEGATIVE mg/dL
Ketones, ur: NEGATIVE mg/dL
Nitrite: NEGATIVE
PROTEIN: 100 mg/dL — AB
SPECIFIC GRAVITY, URINE: 1.023 (ref 1.005–1.030)
WBC, UA: >50 WBC/hpf — ABNORMAL HIGH (ref 0–5)
pH: 5 (ref 5.0–8.0)

## 2018-09-04 LAB — CBC WITH DIFFERENTIAL/PLATELET
Abs Immature Granulocytes: 0.08 10*3/uL — ABNORMAL HIGH (ref 0.00–0.07)
BASOS PCT: 0 %
Basophils Absolute: 0 10*3/uL (ref 0.0–0.1)
Eosinophils Absolute: 0 10*3/uL (ref 0.0–0.5)
Eosinophils Relative: 0 %
HEMATOCRIT: 35.6 % — AB (ref 39.0–52.0)
HEMOGLOBIN: 11.7 g/dL — AB (ref 13.0–17.0)
IMMATURE GRANULOCYTES: 1 %
LYMPHS ABS: 0.2 10*3/uL — AB (ref 0.7–4.0)
LYMPHS PCT: 2 %
MCH: 33.3 pg (ref 26.0–34.0)
MCHC: 32.9 g/dL (ref 30.0–36.0)
MCV: 101.4 fL — ABNORMAL HIGH (ref 80.0–100.0)
Monocytes Absolute: 0.4 10*3/uL (ref 0.1–1.0)
Monocytes Relative: 3 %
NEUTROS ABS: 10.3 10*3/uL — AB (ref 1.7–7.7)
NRBC: 0 % (ref 0.0–0.2)
Neutrophils Relative %: 94 %
PLATELETS: 134 10*3/uL — AB (ref 150–400)
RBC: 3.51 MIL/uL — ABNORMAL LOW (ref 4.22–5.81)
RDW: 13.1 % (ref 11.5–15.5)
WBC: 10.9 10*3/uL — ABNORMAL HIGH (ref 4.0–10.5)

## 2018-09-04 LAB — COMPREHENSIVE METABOLIC PANEL
ALBUMIN: 3.2 g/dL — AB (ref 3.5–5.0)
ALK PHOS: 73 U/L (ref 38–126)
ALT: 66 U/L — AB (ref 0–44)
AST: 62 U/L — ABNORMAL HIGH (ref 15–41)
Anion gap: 9 (ref 5–15)
BUN: 31 mg/dL — AB (ref 8–23)
CALCIUM: 8.6 mg/dL — AB (ref 8.9–10.3)
CO2: 22 mmol/L (ref 22–32)
CREATININE: 1.03 mg/dL (ref 0.61–1.24)
Chloride: 105 mmol/L (ref 98–111)
GFR calc Af Amer: >60 mL/min (ref >60)
GFR calc non Af Amer: >60 mL/min (ref >60)
Glucose, Bld: 109 mg/dL — ABNORMAL HIGH (ref 70–99)
Potassium: 3.6 mmol/L (ref 3.5–5.1)
Sodium: 136 mmol/L (ref 135–145)
Total Bilirubin: 1.1 mg/dL (ref 0.3–1.2)
Total Protein: 5.7 g/dL — ABNORMAL LOW (ref 6.5–8.1)

## 2018-09-04 LAB — I-STAT CG4 LACTIC ACID, ED
LACTIC ACID, VENOUS: 1.23 mmol/L (ref 0.5–1.9)
Lactic Acid, Venous: 1.01 mmol/L (ref 0.5–1.9)

## 2018-09-04 LAB — PROTIME-INR
INR: 1.45
Prothrombin Time: 17.4 seconds — ABNORMAL HIGH (ref 11.4–15.2)

## 2018-09-04 LAB — MRSA PCR SCREENING: MRSA by PCR: NEGATIVE

## 2018-09-04 MED ORDER — ENOXAPARIN SODIUM 40 MG/0.4ML ~~LOC~~ SOLN
40.0000 mg | SUBCUTANEOUS | Status: DC
  Administered 2018-09-04 – 2018-09-07 (×4): 40 mg via SUBCUTANEOUS
  Filled 2018-09-04 (×4): qty 0.4

## 2018-09-04 MED ORDER — ONDANSETRON HCL 4 MG/2ML IJ SOLN: 4.0000 mg | Freq: Four times a day (QID) | INTRAMUSCULAR | Status: DC | PRN

## 2018-09-04 MED ORDER — IBUPROFEN 200 MG PO TABS: 400.0000 mg | ORAL_TABLET | Freq: Four times a day (QID) | ORAL | Status: DC | PRN

## 2018-09-04 MED ORDER — SODIUM CHLORIDE 0.9 % IV BOLUS
1000.0000 mL | Freq: Once | INTRAVENOUS | Status: AC
  Administered 2018-09-04: 1000 mL via INTRAVENOUS

## 2018-09-04 MED ORDER — CIPROFLOXACIN IN D5W 400 MG/200ML IV SOLN
400.0000 mg | Freq: Two times a day (BID) | INTRAVENOUS | Status: DC
  Administered 2018-09-04 – 2018-09-06 (×5): 400 mg via INTRAVENOUS
  Filled 2018-09-04 (×6): qty 200

## 2018-09-04 MED ORDER — FAMOTIDINE 20 MG PO TABS
20.0000 mg | ORAL_TABLET | Freq: Every day | ORAL | Status: DC
  Administered 2018-09-04 – 2018-09-07 (×4): 20 mg via ORAL
  Filled 2018-09-04 (×4): qty 1

## 2018-09-04 MED ORDER — POTASSIUM CHLORIDE IN NACL 20-0.9 MEQ/L-% IV SOLN
INTRAVENOUS | Status: AC
  Administered 2018-09-04 – 2018-09-05 (×2): via INTRAVENOUS
  Filled 2018-09-04 (×2): qty 1000

## 2018-09-04 MED ORDER — SODIUM CHLORIDE 0.9 % IV BOLUS (SEPSIS)
250.0000 mL | Freq: Once | INTRAVENOUS | Status: AC
  Administered 2018-09-04: 250 mL via INTRAVENOUS

## 2018-09-04 MED ORDER — ASPIRIN 81 MG PO CHEW
81.0000 mg | CHEWABLE_TABLET | Freq: Every evening | ORAL | Status: DC
  Administered 2018-09-04 – 2018-09-07 (×4): 81 mg via ORAL
  Filled 2018-09-04 (×4): qty 1

## 2018-09-04 MED ORDER — SODIUM CHLORIDE 0.9 % IV SOLN
INTRAVENOUS | Status: DC
  Administered 2018-09-04 – 2018-09-05 (×3): via INTRAVENOUS

## 2018-09-04 MED ORDER — ACETAMINOPHEN 650 MG RE SUPP: 650.0000 mg | Freq: Four times a day (QID) | RECTAL | Status: DC | PRN

## 2018-09-04 MED ORDER — ACETAMINOPHEN 325 MG PO TABS
650.0000 mg | ORAL_TABLET | Freq: Once | ORAL | Status: AC
  Administered 2018-09-04: 650 mg via ORAL
  Filled 2018-09-04: qty 2

## 2018-09-04 MED ORDER — OXYMETAZOLINE HCL 0.05 % NA SOLN
1.0000 | Freq: Two times a day (BID) | NASAL | Status: DC
  Administered 2018-09-05 – 2018-09-08 (×6): 1 via NASAL
  Filled 2018-09-04: qty 15

## 2018-09-04 MED ORDER — SODIUM CHLORIDE 0.9 % IV BOLUS (SEPSIS)
1000.0000 mL | Freq: Once | INTRAVENOUS | Status: AC
  Administered 2018-09-04: 1000 mL via INTRAVENOUS

## 2018-09-04 MED ORDER — TAMSULOSIN HCL 0.4 MG PO CAPS
0.4000 mg | ORAL_CAPSULE | Freq: Every evening | ORAL | Status: DC
  Administered 2018-09-04 – 2018-09-07 (×4): 0.4 mg via ORAL
  Filled 2018-09-04 (×4): qty 1

## 2018-09-04 MED ORDER — ACETAMINOPHEN 325 MG PO TABS: 650.0000 mg | ORAL_TABLET | Freq: Four times a day (QID) | ORAL | Status: DC | PRN

## 2018-09-04 MED ORDER — ONDANSETRON HCL 4 MG PO TABS: 4.0000 mg | ORAL_TABLET | Freq: Four times a day (QID) | ORAL | Status: DC | PRN

## 2018-09-04 MED ORDER — SODIUM CHLORIDE 0.9 % IV SOLN
1.0000 g | INTRAVENOUS | Status: DC
  Administered 2018-09-04: 1 g via INTRAVENOUS
  Filled 2018-09-04: qty 10

## 2018-09-04 NOTE — ED Triage Notes (Signed)
Patient BIB EMS from home. Patient saw PCP 2 days ago and was diagnosed with UTI and was placed on doxycycline rx. Today, patient tachycardic, tachypnic, diaphoretic, EtCO2 20s, hypotensive. 512mL IV NS administered via EMS. Oxygen sats 92% RA- 2LNC and sats came up to 99%. Patient has history of enlarged prostate.

## 2018-09-04 NOTE — ED Notes (Signed)
Bed: JR93 Expected date:  Expected time:  Means of arrival:  Comments: EMS recently dx with UTI, fever 20s

## 2018-09-04 NOTE — ED Notes (Signed)
BP 80/50 manually. Hospitalist notified.

## 2018-09-04 NOTE — ED Provider Notes (Signed)
[facility] DEPT Provider Note   CSN: [id number] Arrival date & time: 09/04/18  1515     History   Chief Complaint Chief Complaint  Patient presents with  . Weakness  . Hematuria    HPI Brandon Acosta is a 77 y.o. male.  77 year old male presents with 2-day history of UTI.  Was seen by urology and placed on doxycycline.  Has continued to endorse chills as well as diaphoresis and intermittent confusion.  Symptoms improved with taken ibuprofen.  Denies any vomiting or diarrhea.  No cough or congestion.  Wife called EMS and patient transported here     Past Medical History:  Diagnosis Date  . Arthritis   . Diverticulitis   . GERD (gastroesophageal reflux disease)   . Hyperlipemia   . Osteoporosis     Patient Active Problem List   Diagnosis Date Noted  . Abnormal ECG during exercise stress test     Past Surgical History:  Procedure Laterality Date  . APPENDECTOMY  as child  . CARDIAC CATHETERIZATION N/A 10/16/2016   Procedure: Left Heart Cath and Coronary Angiography;  Surgeon: Jettie Booze, MD;  Location: [facility];  Service: Cardiovascular;  Laterality: N/A;  . COLONOSCOPY WITH PROPOFOL N/A 02/11/2014   Procedure: COLONOSCOPY WITH PROPOFOL;  Surgeon: Cleotis Nipper, MD;  Location: [facility];  Service: Endoscopy;  Laterality: N/A;  . KNEE ARTHROSCOPY Right 2013   torn meniscus repair        Home Medications    Prior to Admission medications   Medication Sig Start Date End Date Taking? Authorizing Provider  aspirin 81 MG tablet Take 81 mg by mouth every evening.     [provider]  cholecalciferol (VITAMIN D) 1000 UNITS tablet Take 1,000 Units by mouth every evening.     [provider]  ibuprofen (ADVIL,MOTRIN) 200 MG tablet Take 400 mg by mouth daily as needed for moderate pain.    [provider]  omeprazole (PRILOSEC) 20 MG capsule Take 20 mg by mouth daily.    [provider]  oxymetazoline (AFRIN) 0.05 % nasal spray Place 1 spray into both nostrils 2 (two) times daily as needed for congestion.    [provider]  simvastatin (ZOCOR) 40 MG tablet Take 40 mg by mouth 3 (three) times a week.    [provider]  tamsulosin (FLOMAX) 0.4 MG CAPS capsule Take 0.4 mg by mouth every evening. 08/24/16   [provider]    Family History History reviewed. No pertinent family history.  Social History Social History   Tobacco Use  . Smoking status: Former Smoker    Packs/day: 0.50    Years: 20.00    Pack years: 10.00    Types: Cigarettes    Last attempt to quit: 09/24/1976    Years since quitting: 41.9  . Smokeless tobacco: Never Used  Substance Use Topics  . Alcohol use: Yes    Comment: occassionally 1 glass wine per day  . Drug use: No     Allergies   Patient has no known allergies.   Review of Systems Review of Systems  All other systems reviewed and are negative.    Physical Exam Updated Vital Signs BP (!) 104/49   Pulse 87   Temp (!) 100.7 F (38.2 C) (Rectal)   Resp (!) 21   SpO2 96%   Physical Exam Vitals signs and nursing note reviewed.  Constitutional:      General: He is  not in acute distress.    Appearance: Normal appearance. He is well-developed. He is not toxic-appearing.  HENT:     Head: Normocephalic and atraumatic.  Eyes:     General: Lids are normal.     Conjunctiva/sclera: Conjunctivae normal.     Pupils: Pupils are equal, round, and reactive to light.  Neck:     Musculoskeletal: Normal range of motion and neck supple.     Thyroid: No thyroid mass.     Trachea: No tracheal deviation.  Cardiovascular:     Rate and Rhythm: Normal rate and regular rhythm.     Heart sounds: Normal heart sounds. No murmur. No gallop.   Pulmonary:     Effort: Pulmonary effort is normal. No respiratory distress.     Breath sounds: Normal breath sounds. No stridor. No decreased breath sounds, wheezing,  rhonchi or rales.  Abdominal:     General: Bowel sounds are normal. There is no distension.     Palpations: Abdomen is soft.     Tenderness: There is no abdominal tenderness. There is no rebound.  Musculoskeletal: Normal range of motion.        General: No tenderness.  Skin:    General: Skin is warm and dry.     Findings: No abrasion or rash.  Neurological:     Mental Status: He is alert and oriented to person, place, and time.     GCS: GCS eye subscore is 4. GCS verbal subscore is 5. GCS motor subscore is 6.     Cranial Nerves: No cranial nerve deficit.     Sensory: No sensory deficit.  Psychiatric:        Speech: Speech normal.        Behavior: Behavior normal.      ED Treatments / Results  Labs (all labs ordered are listed, but only abnormal results are displayed) Labs Reviewed  CULTURE, BLOOD (ROUTINE X 2)  CULTURE, BLOOD (ROUTINE X 2)  COMPREHENSIVE METABOLIC PANEL  CBC WITH DIFFERENTIAL/PLATELET  PROTIME-INR  URINALYSIS, ROUTINE W REFLEX MICROSCOPIC  I-STAT CG4 LACTIC ACID, ED    EKG None  Radiology No results found.  Procedures Procedures (including critical care time)  Medications Ordered in ED Medications  acetaminophen (TYLENOL) tablet 650 mg (has no administration in time range)     Initial Impression / Assessment and Plan / ED Course  I have reviewed the triage vital signs and the nursing notes.  Pertinent labs & imaging results that were available during my care of the patient were reviewed by me and considered in my medical decision making (see chart for details).    Patient given IV fluids here and started on antibiotics for his urinary tract infection.  Discussed with hospitalist he will be admitted  Final Clinical Impressions(s) / ED Diagnoses   Final diagnoses:  None    ED Discharge Orders    None       Lacretia Leigh, MD 09/04/18 1649

## 2018-09-04 NOTE — ED Notes (Signed)
Hospitalist paged regarding patient low bp. Awaiting response.

## 2018-09-04 NOTE — ED Notes (Signed)
Hospitalist called back. IVF ordered. Manual BP ordered.

## 2018-09-04 NOTE — ED Notes (Signed)
ED TO INPATIENT HANDOFF REPORT  Name/Age/Gender Brandon Acosta 77 y.o. male  Code Status Code Status History    Date Active Date Inactive Code Status Order ID Comments User Context   10/16/2016 1023 10/16/2016 1658 Full Code [id number]  Jettie Booze, MD Inpatient    Advance Directive Documentation     Most Recent Value  Type of Advance Directive  Healthcare Power of Attorney, Living will  Pre-existing out of facility DNR order (yellow form or pink MOST form)  -  "MOST" Form in Place?  -      Home/SNF/Other Home  Chief Complaint weakness  Level of Care/Admitting Diagnosis ED Disposition    ED Disposition Condition [place]: Brandon Acosta [[id number]]  Level of Care: Med-Surg [16]  Diagnosis: SIRS (systemic inflammatory response syndrome) ([place]) [093235]  Admitting Physician: Louellen Molder [phone]  Attending Physician: Louellen Molder [phone]  Estimated length of stay: past midnight tomorrow  Certification:: I certify this patient will need inpatient services for at least 2 midnights  PT Class (Do Not Modify): Inpatient [101]  PT Acc Code (Do Not Modify): Private [1]       Medical History Past Medical History:  Diagnosis Date  . Arthritis   . Diverticulitis   . GERD (gastroesophageal reflux disease)   . Hyperlipemia   . Osteoporosis     Allergies No Known Allergies  IV Location/Drains/Wounds Patient Lines/Drains/Airways Status   Active Line/Drains/Airways    Name:   Placement date:   Placement time:   Site:   Days:   Peripheral IV 09/04/18 Left Forearm   09/04/18    -    Forearm   less than 1   Peripheral IV 09/04/18 Right Forearm   09/04/18    1559    Forearm   less than 1          Labs/Imaging Results for orders placed or performed during the hospital encounter of 09/04/18 (from the past 48 hour(s))  Comprehensive metabolic panel     Status: Abnormal   Collection Time: 09/04/18  3:43 PM  Result Value Ref  Range   Sodium 136 135 - 145 mmol/L   Potassium 3.6 3.5 - 5.1 mmol/L   Chloride 105 98 - 111 mmol/L   CO2 22 22 - 32 mmol/L   Glucose, Bld 109 (H) 70 - 99 mg/dL   BUN 31 (H) 8 - 23 mg/dL   Creatinine, Ser 1.03 0.61 - 1.24 mg/dL   Calcium 8.6 (L) 8.9 - 10.3 mg/dL   Total Protein 5.7 (L) 6.5 - 8.1 g/dL   Albumin 3.2 (L) 3.5 - 5.0 g/dL   AST 62 (H) 15 - 41 U/L   ALT 66 (H) 0 - 44 U/L   Alkaline Phosphatase 73 38 - 126 U/L   Total Bilirubin 1.1 0.3 - 1.2 mg/dL   GFR calc non Af Amer >60 >60 mL/min   GFR calc Af Amer >60 >60 mL/min   Anion gap 9 5 - 15    Comment: Performed at [facility], [place] [address] [place], [place] [address]  CBC with Differential     Status: Abnormal   Collection Time: 09/04/18  3:43 PM  Result Value Ref Range   WBC 10.9 (H) 4.0 - 10.5 K/uL   RBC 3.51 (L) 4.22 - 5.81 MIL/uL   Hemoglobin 11.7 (L) 13.0 - 17.0 g/dL   HCT 35.6 (L) 39.0 - 52.0 %   MCV 101.4 (H) 80.0 -  100.0 fL   MCH 33.3 26.0 - 34.0 pg   MCHC 32.9 30.0 - 36.0 g/dL   RDW 13.1 11.5 - 15.5 %   Platelets 134 (L) 150 - 400 K/uL   nRBC 0.0 0.0 - 0.2 %   Neutrophils Relative % 94 %   Neutro Abs 10.3 (H) 1.7 - 7.7 K/uL   Lymphocytes Relative 2 %   Lymphs Abs 0.2 (L) 0.7 - 4.0 K/uL   Monocytes Relative 3 %   Monocytes Absolute 0.4 0.1 - 1.0 K/uL   Eosinophils Relative 0 %   Eosinophils Absolute 0.0 0.0 - 0.5 K/uL   Basophils Relative 0 %   Basophils Absolute 0.0 0.0 - 0.1 K/uL   Immature Granulocytes 1 %   Abs Immature Granulocytes 0.08 (H) 0.00 - 0.07 K/uL    Comment: Performed at [facility], [place] [address], [place], [place] [address]  Protime-INR     Status: Abnormal   Collection Time: 09/04/18  3:43 PM  Result Value Ref Range   Prothrombin Time 17.4 (H) 11.4 - 15.2 seconds   INR 1.45     Comment: Performed at [facility], [place] [address] [place], [place] [address]  Urinalysis, Routine w reflex microscopic     Status: Abnormal    Collection Time: 09/04/18  3:49 PM  Result Value Ref Range   Color, Urine AMBER (A) YELLOW    Comment: BIOCHEMICALS MAY BE AFFECTED BY COLOR   APPearance CLOUDY (A) CLEAR   Specific Gravity, Urine 1.023 1.005 - 1.030   pH 5.0 5.0 - 8.0   Glucose, UA NEGATIVE NEGATIVE mg/dL   Hgb urine dipstick MODERATE (A) NEGATIVE   Bilirubin Urine NEGATIVE NEGATIVE   Ketones, ur NEGATIVE NEGATIVE mg/dL   Protein, ur 100 (A) NEGATIVE mg/dL   Nitrite NEGATIVE NEGATIVE   Leukocytes, UA LARGE (A) NEGATIVE   RBC / HPF >50 (H) 0 - 5 RBC/hpf   WBC, UA >50 (H) 0 - 5 WBC/hpf   Bacteria, UA NONE SEEN NONE SEEN   WBC Clumps PRESENT    Mucus PRESENT    Non Squamous Epithelial 0-5 (A) NONE SEEN    Comment: Performed at [facility], [place] [address] [place], [place] [address]  I-Stat CG4 Lactic Acid, ED     Status: None   Collection Time: 09/04/18  3:51 PM  Result Value Ref Range   Lactic Acid, Venous 1.23 0.5 - 1.9 mmol/L  I-Stat CG4 Lactic Acid, ED     Status: None   Collection Time: 09/04/18  6:02 PM  Result Value Ref Range   Lactic Acid, Venous 1.01 0.5 - 1.9 mmol/L   Dg Chest 2 View  Result Date: 09/04/2018 CLINICAL DATA:  History of recent urinary tract infection, now with tachycardia and tachypnea EXAM: CHEST - 2 VIEW COMPARISON:  Chest x-ray of 03/08/2017 FINDINGS: No pneumonia or effusion is seen. Very mild pulmonary vascular congestion is a consideration. The heart is within upper limits of normal. No acute bony abnormality is seen. IMPRESSION: Question of mild pulmonary vascular congestion. No pneumonia or pleural effusion. Electronically Signed   By: Ivar Drape M.D.   On: 09/04/2018 16:38   EKG Interpretation  Date/Time:  Thursday September 04 2018 15:32:47 EST Ventricular Rate:  82 PR Interval:    QRS Duration: 92 QT Interval:  354 QTC Calculation: 414 R Axis:   77 Text Interpretation:  Sinus rhythm Consider left atrial enlargement Confirmed by Lacretia Leigh ([id number]) on  09/04/2018 4:28:53 PM Also confirmed by Lacretia Leigh ([id number]), editor Philomena Doheny [id number])  on 09/04/2018 4:39:40 PM   Pending Labs Unresulted Labs (From admission, onward)    Start     Ordered   09/04/18 1523  Culture, blood (Routine x 2)  BLOOD CULTURE X 2,   STAT     09/04/18 1522   Signed and Held  CBC  (enoxaparin (LOVENOX)    CrCl >/= 30 ml/min)  Once,   R    Comments:  Baseline for enoxaparin therapy IF NOT ALREADY DRAWN.  Notify MD if PLT < 100 K.    Signed and Held   Signed and Held  Creatinine, serum  (enoxaparin (LOVENOX)    CrCl >/= 30 ml/min)  Once,   R    Comments:  Baseline for enoxaparin therapy IF NOT ALREADY DRAWN.    Signed and Held   Signed and Held  Creatinine, serum  (enoxaparin (LOVENOX)    CrCl >/= 30 ml/min)  Weekly,   R    Comments:  while on enoxaparin therapy    Signed and Held   Signed and Held  CBC  Tomorrow morning,   R     Signed and Held   Signed and Held  Comprehensive metabolic panel  Tomorrow morning,   R     Signed and Held          Vitals/Pain Today's Vitals   09/04/18 1735 09/04/18 1738 09/04/18 1745 09/04/18 1800  BP: (!) 85/54 (!) 93/54 92/60 (!) 88/52  Pulse: 77 81 77 73  Resp: 16 17 19 18   Temp:      TempSrc:      SpO2: 95% 97% 95% 94%  Weight:        Isolation Precautions No active isolations  Medications Medications  cefTRIAXone (ROCEPHIN) 1 g in sodium chloride 0.9 % 100 mL IVPB (0 g Intravenous Stopped 09/04/18 1734)  sodium chloride 0.9 % bolus 1,000 mL (1,000 mLs Intravenous New Bag/Given 09/04/18 1805)  acetaminophen (TYLENOL) tablet 650 mg (650 mg Oral Given 09/04/18 1608)  sodium chloride 0.9 % bolus 1,000 mL (0 mLs Intravenous Stopped 09/04/18 1649)    And  sodium chloride 0.9 % bolus 1,000 mL (0 mLs Intravenous Stopped 09/04/18 1734)    And  sodium chloride 0.9 % bolus 250 mL (0 mLs Intravenous Stopped 09/04/18 1755)    Mobility walks

## 2018-09-04 NOTE — Progress Notes (Signed)
Patient seen at bedside at request of admitting physician due to hypotension.  He is being admitted for sepsis due to UTI/prostatitis, initial urine studies drawn at his urologist office yesterday (not available in epic) and repeat studies drawn today.  He has been started on IV ceftriaxone and has received 3.25 L normal saline so far.  On exam, patient is in the Trendelenburg position but otherwise asymptomatic.  His son is at bedside.  He is calm, alert, and oriented x3.  His blood pressure is in the 80s/50s with map maintained at 65 while in Trendelenburg.  Card exam shows regular rate and rhythm.  Lungs are clear to auscultation anteriorly.  Abdomen is nontender.  He has no peripheral edema.  He says normally his blood pressure runs between 563-875 systolic and occasionally in 90s without symptoms.  Prior bladder scan reportedly showed 33 mLs.  Repeat shows ~150 mLs.   A/P: He is hypotensive but asymptomatic and his MAPs are maintaining at 65.  We will continue with current plan of care and complete fourth liter bolus of fluids and add on maintenance fluids afterwards overnight.  Will change from MedSurg to stepdown unit.  Will order prn bladder scans and in and out cath as needed.  Monitor strict I/O's.  Discussed with nursing staff to notify if MAP persistently <55 and/or patient becomes symptomatic.  Continue IV ceftriaxone for now, follow-up blood and urine cultures.  Zada Finders, MD [phone]

## 2018-09-04 NOTE — H&P (Addendum)
TRH H&P   Patient Demographics:    Brandon Acosta, is a 77 y.o. male  MRN: [id number]   DOB - [birth year]  Admit Date - 09/04/2018  Outpatient Primary MD for the patient is [name], MD  Referring MD: Dr. Zenia Resides  Outpatient Specialists: Dr. Diona Fanti (neurology)  Patient coming from: Home  Chief Complaint  Patient presents with  . Weakness  . Hematuria      HPI:    Brandon Acosta  is a 77 y.o. male, with history of BPH and dyslipidemia who presented to the ED with fevers and chills since this morning.  Patient reports history of UTI with gross hematuria about 2 years back for which she was treated with either Cipro or Bactrim by his urologist and recovered.  He reports several episodes of UTI in the past 2 years without hematuria and has been treated by either a quinolone or Bactrim.  Yesterday he had a sudden onset of hematuria (gross) for which she went to see his urologist Dr. [name] PA.  He had urine culture sent from the office and prescribed doxycycline.  Patient took total 3 doses.  He was however having episodes of gross hematuria.  He also had poor p.o. intake since yesterday and increased urinary frequency but no dysuria.  This morning he started having severe chills and subjective fevers.  He felt extremely weak and had poor p.o. intake.  He also started having lower abdominal cramping and became unsteady feeling like he was going to pass out.  He denies any headache, blurred vision, nausea, vomiting, chest pain, palpitations, shortness of breath, diarrhea, tingling or numbness of the extremities.  Denies any recent illness or sick contact.  Denies any recent travel.  Denies any new medications.  Course in the ED Patient was febrile with temperature of 100.7 F, tachypneic, hypotensive with blood pressure of 92/45 mmHg.  Had WBC of 10.9K hemoglobin of 11.7  and platelets of 134.  BUN was 31 and glucose of 109.  Lactic acid of 1.23.  LFTs showed mild transaminitis in the 60s chest x-ray negative for infiltrate and shows mild pulmonary vascular congestion. UA showed cloudy urine with moderate hemoglobin, large leukocytes, negative nitrate, >50 WBC and negative for bacteria but with WBC clumps and mucus.  Blood culture sent.  Urine culture not sent.  Patient met criteria for early sepsis and given 2.25 L normal saline bolus, Tylenol and 1 g IV Rocephin.  Feels consulted for admission to medical floor.   Review of systems:    In addition to the HPI above,  Fevers and chills No Headache, No changes with Vision or hearing, No problems swallowing food or Liquids, No Chest pain, Cough or Shortness of Breath, Abdominal pain++, increased frequency of urination, hematuria+++, bowel movements are regular, No Blood in stool  No dysuria, No new skin rashes or bruises, No new joints  pains-aches,  Generalized weakness, tingling, numbness in any extremity, No recent weight gain or loss, No polyuria, polydypsia or polyphagia, No significant Mental Stressors.     With Past History of the following :    Past Medical History:  Diagnosis Date  . Arthritis   . Diverticulitis   . GERD (gastroesophageal reflux disease)   . Hyperlipemia   . Osteoporosis       Past Surgical History:  Procedure Laterality Date  . APPENDECTOMY  as child  . CARDIAC CATHETERIZATION N/A 10/16/2016   Procedure: Left Heart Cath and Coronary Angiography;  Surgeon: Jettie Booze, MD;  Location: [facility];  Service: Cardiovascular;  Laterality: N/A;  . COLONOSCOPY WITH PROPOFOL N/A 02/11/2014   Procedure: COLONOSCOPY WITH PROPOFOL;  Surgeon: Cleotis Nipper, MD;  Location: [facility];  Service: Endoscopy;  Laterality: N/A;  . KNEE ARTHROSCOPY Right 2013   torn meniscus repair      Social History:     Social History   Tobacco Use  . Smoking status: Former  Smoker    Packs/day: 0.50    Years: 20.00    Pack years: 10.00    Types: Cigarettes    Last attempt to quit: 09/24/1976    Years since quitting: 41.9  . Smokeless tobacco: Never Used  Substance Use Topics  . Alcohol use: Yes    Comment: occassionally 1 glass wine per day     Lives -home with wife  Mobility -independent   Family History :   No family history of heart disease, stroke or malignancy.   Home Medications:   Prior to Admission medications   Medication Sig Start Date End Date Taking? Authorizing Provider  aspirin 81 MG tablet Take 81 mg by mouth every evening.    Yes [provider]  doxycycline (ADOXA) 100 MG tablet Take 100 mg by mouth 2 (two) times daily.   Yes [provider]  famotidine (PEPCID) 20 MG tablet Take 20 mg by mouth at bedtime.   Yes [provider]  ibuprofen (ADVIL,MOTRIN) 200 MG tablet Take 400 mg by mouth daily as needed for fever or moderate pain.    Yes [provider]  oxymetazoline (AFRIN) 0.05 % nasal spray Place 1 spray into both nostrils 2 (two) times daily.    Yes [provider]  simvastatin (ZOCOR) 40 MG tablet Take 40 mg by mouth every evening.    Yes [provider]  tamsulosin (FLOMAX) 0.4 MG CAPS capsule Take 0.4 mg by mouth every evening. 08/24/16  Yes [provider]     Allergies:    No Known Allergies   Physical Exam:   Vitals  Blood pressure 102/64, pulse 82, temperature (!) 100.7 F (38.2 C), temperature source Rectal, resp. rate (!) 22, weight 68 kg, SpO2 94 %.   General: Elderly male lying in bed in no acute distress, appears fatigued HEENT: Pupils reactive bilaterally, EOMI, no pallor, no icterus, dry mucosa, supple neck, no cervical lymphadenopathy Chest: Clear to auscultation bilaterally, no added sound CVs: S1 and S2 tachycardic, no murmurs rub or gallop GI: Soft, nondistended, no suprapubic or CVA tenderness, bowel sounds present Musculoskeletal:  Warm, no edema CNS: Alert and oriented   Data Review:    CBC Recent Labs  Lab 09/04/18 1543  WBC 10.9*  HGB 11.7*  HCT 35.6*  PLT 134*  MCV 101.4*  MCH 33.3  MCHC 32.9  RDW 13.1  LYMPHSABS 0.2*  MONOABS 0.4  EOSABS 0.0  BASOSABS  0.0   ------------------------------------------------------------------------------------------------------------------  Chemistries  Recent Labs  Lab 09/04/18 1543  NA 136  K 3.6  CL 105  CO2 22  GLUCOSE 109*  BUN 31*  CREATININE 1.03  CALCIUM 8.6*  AST 62*  ALT 66*  ALKPHOS 73  BILITOT 1.1   ------------------------------------------------------------------------------------------------------------------ CrCl cannot be calculated (Unknown ideal weight.). ------------------------------------------------------------------------------------------------------------------ No results for input(s): TSH, T4TOTAL, T3FREE, THYROIDAB in the last 72 hours.  Invalid input(s): FREET3  Coagulation profile Recent Labs  Lab 09/04/18 1543  INR 1.45   ------------------------------------------------------------------------------------------------------------------- No results for input(s): DDIMER in the last 72 hours. -------------------------------------------------------------------------------------------------------------------  Cardiac Enzymes No results for input(s): CKMB, TROPONINI, MYOGLOBIN in the last 168 hours.  Invalid input(s): CK ------------------------------------------------------------------------------------------------------------------ No results found for: BNP   ---------------------------------------------------------------------------------------------------------------  Urinalysis    Component Value Date/Time   COLORURINE AMBER (A) 09/04/2018 1549   APPEARANCEUR CLOUDY (A) 09/04/2018 1549   LABSPEC 1.023 09/04/2018 1549   PHURINE 5.0 09/04/2018 1549   GLUCOSEU NEGATIVE 09/04/2018 1549   HGBUR MODERATE (A)  09/04/2018 1549   BILIRUBINUR NEGATIVE 09/04/2018 1549   KETONESUR NEGATIVE 09/04/2018 1549   PROTEINUR 100 (A) 09/04/2018 1549   NITRITE NEGATIVE 09/04/2018 1549   LEUKOCYTESUR LARGE (A) 09/04/2018 1549    ----------------------------------------------------------------------------------------------------------------   Imaging Results:    Dg Chest 2 View  Result Date: 09/04/2018 CLINICAL DATA:  History of recent urinary tract infection, now with tachycardia and tachypnea EXAM: CHEST - 2 VIEW COMPARISON:  Chest x-ray of 03/08/2017 FINDINGS: No pneumonia or effusion is seen. Very mild pulmonary vascular congestion is a consideration. The heart is within upper limits of normal. No acute bony abnormality is seen. IMPRESSION: Question of mild pulmonary vascular congestion. No pneumonia or pleural effusion. Electronically Signed   By: Ivar Drape M.D.   On: 09/04/2018 16:38    My personal review of EKG:   Assessment & Plan:    Principal Problem:   Sepsis secondary to UTI ([place]) Sepsis pathway initiated in the ED.  Received 2.25 L IV normal saline bolus in the ED.  Blood culture sent and given empiric IV Rocephin.  Urine culture sent at the urologist office yesterday prior to starting antibiotics and will follow up with sensitivity.  No prior urine culture results in the system. Admit to MedSurg.  Given concern for acute prostatitis I will switch antibiotics to IV ciprofloxacin 40 mg every 12 hours.  Continue maintenance IV fluid.  Supportive care with Tylenol and Motrin as needed for pain. He does not have lower abdominal tenderness at present and if symptoms persistent or worsen he will need either a renal ultrasound or CT abdomen and pelvis with contrast to further evaluate the renal anatomy.  Consult his urologist Dr. Diona Fanti if needed.    Active Problems:   Hematuria Secondary to UTI/prostatitis.  Monitor CBC.    BPH (benign prostatic hyperplasia) Resume  Flomax.  Dyslipidemia Hold statin due to elevated transaminases.  Transaminitis Mild.  Hold statin and recheck labs in a.m.  Thrombocytopenia Mild.  Monitor in a.m. lab.  DVT Prophylaxis: Lovenox  AM Labs Ordered, also please review Full Orders  Family Communication: Admission, patients condition and plan of care including tests being ordered have been discussed with the patient and his wife at bedside  Code Status full code  Likely DC to home in next 48 hours if improved and pending culture and sensitivity  Condition: Fair  Consults called: None  Admission status: Inpatient  Patient presenting with early sepsis with fever, hypotension tachypnea  and mild leukocytosis in the setting of UTI and possible prostatitis.  Patient is also dehydrated and needs continued IV fluids in addition to IV antibiotics and follow-up on his blood and urine culture and sensitivity. He needs to be monitored for at least >2 midnights as inpatient.  Time spent in minutes : 60   Brandon Acosta M.D on 09/04/2018 at 5:02 PM  Between 7am to 7pm - Pager - [phone]. After 7pm go to [email or web address] - password [facility]  [facility] - Office  [phone]

## 2018-09-04 NOTE — ED Notes (Signed)
Hospital physician paged x2 regarding patient low blood pressure. Awaiting response.

## 2018-09-04 NOTE — ED Notes (Signed)
ED TO INPATIENT HANDOFF REPORT  Name/Age/Gender Brandon Acosta 77 y.o. male  Code Status Code Status History    Date Active Date Inactive Code Status Order ID Comments User Context   10/16/2016 1023 10/16/2016 1658 Full Code [id number]  Jettie Booze, MD Inpatient    Advance Directive Documentation     Most Recent Value  Type of Advance Directive  Healthcare Power of Attorney, Living will  Pre-existing out of facility DNR order (yellow form or pink MOST form)  -  "MOST" Form in Place?  -      Home/SNF/Other Home  Chief Complaint weakness  Level of Care/Admitting Diagnosis ED Disposition    ED Disposition Condition [place]: [place] [[id number]]  Level of Care: Stepdown [14]  Admit to SDU based on following criteria: Other see comments  Comments: persistent hypotension  Diagnosis: Sepsis secondary to UTI [facility]) [[id number]]  Admitting Physician: Lenore Cordia [[id number]]  Attending Physician: Lenore Cordia [[id number]]  Estimated length of stay: past midnight tomorrow  Certification:: I certify this patient will need inpatient services for at least 2 midnights  PT Class (Do Not Modify): Inpatient [101]  PT Acc Code (Do Not Modify): Private [1]       Medical History Past Medical History:  Diagnosis Date  . Arthritis   . Diverticulitis   . GERD (gastroesophageal reflux disease)   . Hyperlipemia   . Osteoporosis     Allergies No Known Allergies  IV Location/Drains/Wounds Patient Lines/Drains/Airways Status   Active Line/Drains/Airways    Name:   Placement date:   Placement time:   Site:   Days:   Peripheral IV 09/04/18 Left Forearm   09/04/18    -    Forearm   less than 1   Peripheral IV 09/04/18 Right Forearm   09/04/18    1559    Forearm   less than 1          Labs/Imaging Results for orders placed or performed during the hospital encounter of 09/04/18 (from the past 48 hour(s))  Comprehensive metabolic  panel     Status: Abnormal   Collection Time: 09/04/18  3:43 PM  Result Value Ref Range   Sodium 136 135 - 145 mmol/L   Potassium 3.6 3.5 - 5.1 mmol/L   Chloride 105 98 - 111 mmol/L   CO2 22 22 - 32 mmol/L   Glucose, Bld 109 (H) 70 - 99 mg/dL   BUN 31 (H) 8 - 23 mg/dL   Creatinine, Ser 1.03 0.61 - 1.24 mg/dL   Calcium 8.6 (L) 8.9 - 10.3 mg/dL   Total Protein 5.7 (L) 6.5 - 8.1 g/dL   Albumin 3.2 (L) 3.5 - 5.0 g/dL   AST 62 (H) 15 - 41 U/L   ALT 66 (H) 0 - 44 U/L   Alkaline Phosphatase 73 38 - 126 U/L   Total Bilirubin 1.1 0.3 - 1.2 mg/dL   GFR calc non Af Amer >60 >60 mL/min   GFR calc Af Amer >60 >60 mL/min   Anion gap 9 5 - 15    Comment: Performed at [facility], [place] [address] [place],  [address]  CBC with Differential     Status: Abnormal   Collection Time: 09/04/18  3:43 PM  Result Value Ref Range   WBC 10.9 (H) 4.0 - 10.5 K/uL   RBC 3.51 (L) 4.22 - 5.81 MIL/uL   Hemoglobin 11.7 (L) 13.0 - 17.0 g/dL  HCT 35.6 (L) 39.0 - 52.0 %   MCV 101.4 (H) 80.0 - 100.0 fL   MCH 33.3 26.0 - 34.0 pg   MCHC 32.9 30.0 - 36.0 g/dL   RDW 13.1 11.5 - 15.5 %   Platelets 134 (L) 150 - 400 K/uL   nRBC 0.0 0.0 - 0.2 %   Neutrophils Relative % 94 %   Neutro Abs 10.3 (H) 1.7 - 7.7 K/uL   Lymphocytes Relative 2 %   Lymphs Abs 0.2 (L) 0.7 - 4.0 K/uL   Monocytes Relative 3 %   Monocytes Absolute 0.4 0.1 - 1.0 K/uL   Eosinophils Relative 0 %   Eosinophils Absolute 0.0 0.0 - 0.5 K/uL   Basophils Relative 0 %   Basophils Absolute 0.0 0.0 - 0.1 K/uL   Immature Granulocytes 1 %   Abs Immature Granulocytes 0.08 (H) 0.00 - 0.07 K/uL    Comment: Performed at [facility], [place] [address], [place], [place] [address]  Protime-INR     Status: Abnormal   Collection Time: 09/04/18  3:43 PM  Result Value Ref Range   Prothrombin Time 17.4 (H) 11.4 - 15.2 seconds   INR 1.45     Comment: Performed at [facility], [place] [address],  [place], [place] [address]  Urinalysis, Routine w reflex microscopic     Status: Abnormal   Collection Time: 09/04/18  3:49 PM  Result Value Ref Range   Color, Urine AMBER (A) YELLOW    Comment: BIOCHEMICALS MAY BE AFFECTED BY COLOR   APPearance CLOUDY (A) CLEAR   Specific Gravity, Urine 1.023 1.005 - 1.030   pH 5.0 5.0 - 8.0   Glucose, UA NEGATIVE NEGATIVE mg/dL   Hgb urine dipstick MODERATE (A) NEGATIVE   Bilirubin Urine NEGATIVE NEGATIVE   Ketones, ur NEGATIVE NEGATIVE mg/dL   Protein, ur 100 (A) NEGATIVE mg/dL   Nitrite NEGATIVE NEGATIVE   Leukocytes, UA LARGE (A) NEGATIVE   RBC / HPF >50 (H) 0 - 5 RBC/hpf   WBC, UA >50 (H) 0 - 5 WBC/hpf   Bacteria, UA NONE SEEN NONE SEEN   WBC Clumps PRESENT    Mucus PRESENT    Non Squamous Epithelial 0-5 (A) NONE SEEN    Comment: Performed at [facility], [place] [address], [place], [place] [address]  I-Stat CG4 Lactic Acid, ED     Status: None   Collection Time: 09/04/18  3:51 PM  Result Value Ref Range   Lactic Acid, Venous 1.23 0.5 - 1.9 mmol/L  I-Stat CG4 Lactic Acid, ED     Status: None   Collection Time: 09/04/18  6:02 PM  Result Value Ref Range   Lactic Acid, Venous 1.01 0.5 - 1.9 mmol/L   Dg Chest 2 View  Result Date: 09/04/2018 CLINICAL DATA:  History of recent urinary tract infection, now with tachycardia and tachypnea EXAM: CHEST - 2 VIEW COMPARISON:  Chest x-ray of 03/08/2017 FINDINGS: No pneumonia or effusion is seen. Very mild pulmonary vascular congestion is a consideration. The heart is within upper limits of normal. No acute bony abnormality is seen. IMPRESSION: Question of mild pulmonary vascular congestion. No pneumonia or pleural effusion. Electronically Signed   By: Ivar Drape M.D.   On: 09/04/2018 16:38   EKG Interpretation  Date/Time:  Thursday September 04 2018 15:32:47 EST Ventricular Rate:  82 PR Interval:    QRS Duration: 92 QT Interval:  354 QTC Calculation: 414 R Axis:   77 Text  Interpretation:  Sinus rhythm Consider left atrial enlargement Confirmed by Lacretia Leigh ([id number]) on 09/04/2018 4:28:53 PM Also confirmed by Lacretia Leigh ([id number]), editor Philomena Doheny [phone])  on 09/04/2018 4:39:40 PM   Pending Labs Unresulted Labs (From admission, onward)    Start     Ordered   09/04/18 1523  Culture, blood (Routine x 2)  BLOOD CULTURE X 2,   STAT     09/04/18 1522   Signed and Held  CBC  (enoxaparin (LOVENOX)    CrCl >/= 30 ml/min)  Once,   R    Comments:  Baseline for enoxaparin therapy IF NOT ALREADY DRAWN.  Notify MD if PLT < 100 K.    Signed and Held   Signed and Held  Creatinine, serum  (enoxaparin (LOVENOX)    CrCl >/= 30 ml/min)  Once,   R    Comments:  Baseline for enoxaparin therapy IF NOT ALREADY DRAWN.    Signed and Held   Signed and Held  Creatinine, serum  (enoxaparin (LOVENOX)    CrCl >/= 30 ml/min)  Weekly,   R    Comments:  while on enoxaparin therapy    Signed and Held   Signed and Held  CBC  Tomorrow morning,   R     Signed and Held   Signed and Held  Comprehensive metabolic panel  Tomorrow morning,   R     Signed and Held          Vitals/Pain Today's Vitals   09/04/18 1827 09/04/18 1830 09/04/18 1845 09/04/18 1900  BP: (!) 80/50 (!) 85/58 (!) 79/56 (!) 84/55  Pulse:  69 65 74  Resp:  20 (!) 22 18  Temp:      TempSrc:      SpO2:  95% 95% 95%  Weight:        Isolation Precautions No active isolations  Medications Medications  cefTRIAXone (ROCEPHIN) 1 g in sodium chloride 0.9 % 100 mL IVPB (0 g Intravenous Stopped 09/04/18 1734)  0.9 % NaCl with KCl 20 mEq/ L  infusion (has no administration in time range)  acetaminophen (TYLENOL) tablet 650 mg (650 mg Oral Given 09/04/18 1608)  sodium chloride 0.9 % bolus 1,000 mL (0 mLs Intravenous Stopped 09/04/18 1649)    And  sodium chloride 0.9 % bolus 1,000 mL (0 mLs Intravenous Stopped 09/04/18 1734)    And  sodium chloride 0.9 % bolus 250 mL (0 mLs Intravenous Stopped 09/04/18 1755)   sodium chloride 0.9 % bolus 1,000 mL (0 mLs Intravenous Stopped 09/04/18 1849)  sodium chloride 0.9 % bolus 1,000 mL (1,000 mLs Intravenous Bolus 09/04/18 1911)    Mobility walks

## 2018-09-05 DIAGNOSIS — R7881 Bacteremia: Secondary | ICD-10-CM

## 2018-09-05 DIAGNOSIS — R310 Gross hematuria: Secondary | ICD-10-CM

## 2018-09-05 DIAGNOSIS — D649 Anemia, unspecified: Secondary | ICD-10-CM

## 2018-09-05 DIAGNOSIS — N390 Urinary tract infection, site not specified: Secondary | ICD-10-CM

## 2018-09-05 DIAGNOSIS — A419 Sepsis, unspecified organism: Secondary | ICD-10-CM

## 2018-09-05 LAB — CBC
HEMATOCRIT: 35.6 % — AB (ref 39.0–52.0)
Hemoglobin: 11.2 g/dL — ABNORMAL LOW (ref 13.0–17.0)
MCH: 33.4 pg (ref 26.0–34.0)
MCHC: 31.5 g/dL (ref 30.0–36.0)
MCV: 106.3 fL — ABNORMAL HIGH (ref 80.0–100.0)
Platelets: 113 10*3/uL — ABNORMAL LOW (ref 150–400)
RBC: 3.35 MIL/uL — ABNORMAL LOW (ref 4.22–5.81)
RDW: 13.4 % (ref 11.5–15.5)
WBC: 11.6 10*3/uL — ABNORMAL HIGH (ref 4.0–10.5)
nRBC: 0 % (ref 0.0–0.2)

## 2018-09-05 LAB — BLOOD CULTURE ID PANEL (REFLEXED)
Acinetobacter baumannii: NOT DETECTED
CANDIDA GLABRATA: NOT DETECTED
Candida albicans: NOT DETECTED
Candida krusei: NOT DETECTED
Candida parapsilosis: NOT DETECTED
Candida tropicalis: NOT DETECTED
Carbapenem resistance: NOT DETECTED
Enterobacter cloacae complex: NOT DETECTED
Enterobacteriaceae species: DETECTED — AB
Enterococcus species: NOT DETECTED
Escherichia coli: NOT DETECTED
Haemophilus influenzae: NOT DETECTED
Klebsiella oxytoca: NOT DETECTED
Klebsiella pneumoniae: NOT DETECTED
Listeria monocytogenes: NOT DETECTED
Neisseria meningitidis: NOT DETECTED
Proteus species: NOT DETECTED
Pseudomonas aeruginosa: NOT DETECTED
STAPHYLOCOCCUS SPECIES: NOT DETECTED
Serratia marcescens: NOT DETECTED
Staphylococcus aureus (BCID): NOT DETECTED
Streptococcus agalactiae: NOT DETECTED
Streptococcus pneumoniae: NOT DETECTED
Streptococcus pyogenes: NOT DETECTED
Streptococcus species: NOT DETECTED

## 2018-09-05 LAB — COMPREHENSIVE METABOLIC PANEL
ALT: 242 U/L — ABNORMAL HIGH (ref 0–44)
AST: 243 U/L — ABNORMAL HIGH (ref 15–41)
Albumin: 2.6 g/dL — ABNORMAL LOW (ref 3.5–5.0)
Alkaline Phosphatase: 83 U/L (ref 38–126)
Anion gap: 6 (ref 5–15)
BILIRUBIN TOTAL: 1.2 mg/dL (ref 0.3–1.2)
BUN: 24 mg/dL — ABNORMAL HIGH (ref 8–23)
CO2: 18 mmol/L — ABNORMAL LOW (ref 22–32)
Calcium: 7.9 mg/dL — ABNORMAL LOW (ref 8.9–10.3)
Chloride: 112 mmol/L — ABNORMAL HIGH (ref 98–111)
Creatinine, Ser: 0.83 mg/dL (ref 0.61–1.24)
GFR calc Af Amer: >60 mL/min (ref >60)
GFR calc non Af Amer: >60 mL/min (ref >60)
Glucose, Bld: 120 mg/dL — ABNORMAL HIGH (ref 70–99)
POTASSIUM: 4.7 mmol/L (ref 3.5–5.1)
Sodium: 136 mmol/L (ref 135–145)
Total Protein: 4.9 g/dL — ABNORMAL LOW (ref 6.5–8.1)

## 2018-09-05 NOTE — Progress Notes (Signed)
Urine culture from our office from 09/03/2018 grew > 100,000 colonies of Enterobacter.  Sensitive to amikacin, aztreonam, ceftriaxone, ceftaz edema, cefotaxime, ciprofloxacin, cefepime, ertapenem, gentamicin, levofloxacin, meropenem, Pip/tazo, trimethoprim/sulfa, tetracycline, tobramycin.  Resistant to ampicillin/sulbactam, ampicillin, amoxicillin/clavulanate, ceftezole and, cefotetan.  Currently, IV ciprofloxacin seems to be a good choice.

## 2018-09-05 NOTE — Progress Notes (Addendum)
[facility] PROGRESS NOTE  Brandon Acosta RFF:[id number] DOB: 10/27/1940 DOA: 09/04/2018  PCP: [name], MD  Brief History/Interval Summary: 77 y.o. male, with history of BPH and dyslipidemia who presented to the ED with fevers and chills.  Patient reported history of UTI with gross hematuria about 2 years back for which he was treated with either Cipro or Bactrim by his urologist and recovered.  He reports several episodes of UTI in the past 2 years without hematuria and has been treated by either a quinolone or Bactrim.    The day prior to admission he had a sudden onset of hematuria (gross) for which he went to see his urologist Dr. [name] PA.  He had urine culture sent from the office and prescribed doxycycline.  Patient took total 3 doses.    Since symptoms were not improving he decided to come into the emergency department.  Patient was found to be febrile hypotensive tachypneic.  There was concern for sepsis.  He was hospitalized for further management.    Reason for Visit: Sepsis due to UTI versus prostatitis  Consultants: None  Procedures: None  Antibiotics: Intravenous ciprofloxacin  Subjective/Interval History: Patient states that he feels slightly better this morning.  He had another episode of chills this morning.  Denies any dizziness or lightheadedness.  Some nausea last night but not this morning.  His wife is at the bedside.  ROS: He denies any abdominal pain.  Does complain of some back pain that he developed after his episode of chills.  Objective:  Vital Signs  Vitals:   09/05/18 0600 09/05/18 0758 09/05/18 0800 09/05/18 1006  BP: (!) 106/51  (!) 100/52 (!) 100/52  Pulse: (!) 59  64 64  Resp: 19  (!) 26 (!) 26  Temp:  98.4 F (36.9 C)  98.4 F (36.9 C)  TempSrc:  Oral  Oral  SpO2: 98%  97%   Weight:    68 kg  Height:    5\' 8"  (1.727 m)    Intake/Output Summary (Last 24 hours) at 09/05/2018 1124 Last data filed at 09/05/2018  0955 Gross per 24 hour  Intake 8161.58 ml  Output 650 ml  Net 7511.58 ml   Filed Weights   09/04/18 1641 09/05/18 1006  Weight: 68 kg 68 kg    General appearance: alert, cooperative, appears stated age and no distress Head: Normocephalic, without obvious abnormality, atraumatic Resp: Normal effort at rest.  Clear to auscultation bilaterally. Cardio: regular rate and rhythm, S1, S2 normal, no murmur, click, rub or gallop GI: soft, non-tender; bowel sounds normal; no masses,  no organomegaly Extremities: extremities normal, atraumatic, no cyanosis or edema Skin: Skin color, texture, turgor normal. No rashes or lesions Neurologic: Alert and oriented x3.  No obvious focal neurological deficits.  Lab Results:  Data Reviewed: I have personally reviewed following labs and imaging studies  CBC: Recent Labs  Lab 09/04/18 1543 09/05/18 0336  WBC 10.9* 11.6*  NEUTROABS 10.3*  --   HGB 11.7* 11.2*  HCT 35.6* 35.6*  MCV 101.4* 106.3*  PLT 134* 113*    Basic Metabolic Panel: Recent Labs  Lab 09/04/18 1543 09/05/18 0336  NA 136 136  K 3.6 4.7  CL 105 112*  CO2 22 18*  GLUCOSE 109* 120*  BUN 31* 24*  CREATININE 1.03 0.83  CALCIUM 8.6* 7.9*    GFR: Estimated Creatinine Clearance: 72.8 mL/min (by C-G formula based on SCr of 0.83 mg/dL).  Liver Function Tests: Recent Labs  Lab 09/04/18  1543 09/05/18 0336  AST 62* 243*  ALT 66* 242*  ALKPHOS 73 83  BILITOT 1.1 1.2  PROT 5.7* 4.9*  ALBUMIN 3.2* 2.6*    Coagulation Profile: Recent Labs  Lab 09/04/18 1543  INR 1.45     Recent Results (from the past 240 hour(s))  Culture, blood (Routine x 2)     Status: None (Preliminary result)   Collection Time: 09/04/18  3:49 PM  Result Value Ref Range Status   Specimen Description   Final    BLOOD BLOOD RIGHT FOREARM Performed at [facility] [address] [place], [place] [address]    Special Requests   Final    BOTTLES DRAWN AEROBIC AND  ANAEROBIC Blood Culture adequate volume Performed at [facility] [address], [place], [place] [address]    Culture  Setup Time   Final    GRAM NEGATIVE RODS ANAEROBIC BOTTLE ONLY Organism ID to follow Performed at [facility], [place] [address], [place], [place] [address]    Culture GRAM NEGATIVE RODS  Final   Report Status PENDING  Incomplete  Culture, blood (Routine x 2)     Status: None (Preliminary result)   Collection Time: 09/04/18  3:49 PM  Result Value Ref Range Status   Specimen Description   Final    BLOOD BLOOD LEFT FOREARM Performed at [facility] [address], [place], [place] [address]    Special Requests   Final    BOTTLES DRAWN AEROBIC AND ANAEROBIC Blood Culture adequate volume Performed at [facility] [address], [place], [place] [address]    Culture   Final    NO GROWTH < 24 HOURS Performed at [facility] [address], [place], [place] [address]    Report Status PENDING  Incomplete  MRSA PCR Screening     Status: None   Collection Time: 09/04/18  8:57 PM  Result Value Ref Range Status   MRSA by PCR NEGATIVE NEGATIVE Final    Comment:        The GeneXpert MRSA Assay (FDA approved for NASAL specimens only), is one component of a comprehensive MRSA colonization surveillance program. It is not intended to diagnose MRSA infection nor to guide or monitor treatment for MRSA infections. Performed at [facility], [place] [address], [place], [place] [address]       Radiology Studies: Dg Chest 2 View  Result Date: 09/04/2018 CLINICAL DATA:  History of recent urinary tract infection, now with tachycardia and tachypnea EXAM: CHEST - 2 VIEW COMPARISON:  Chest x-ray of 03/08/2017 FINDINGS: No pneumonia or effusion is seen. Very mild pulmonary vascular congestion is a consideration. The heart is within upper limits of normal. No acute bony abnormality is seen.  IMPRESSION: Question of mild pulmonary vascular congestion. No pneumonia or pleural effusion. Electronically Signed   By: Ivar Drape M.D.   On: 09/04/2018 16:38     Medications:  Scheduled: . aspirin  81 mg Oral QPM  . enoxaparin (LOVENOX) injection  40 mg Subcutaneous Q24H  . famotidine  20 mg Oral QHS  . oxymetazoline  1 spray Each Nare BID  . tamsulosin  0.4 mg Oral QPM   Continuous: . sodium chloride 100 mL/hr at 09/05/18 0831  . ciprofloxacin 400 mg (09/05/18 0936)   FFM:[id number] **OR** acetaminophen, ibuprofen, ondansetron **OR** ondansetron (ZOFRAN) IV    Assessment/Plan:  Sepsis secondary to UTI versus prostatitis/gram-negative bacteremia Blood culture positive  for gram-negative bacteria.  Urine culture was sent at his urologist office on Wednesday.  I did call the office.  No results are available as yet.  Patient remains on IV ciprofloxacin which will be continued for now.  Patient had episode of chills this morning.  His blood pressure was soft yesterday evening requiring IV fluid boluses.  Blood pressures have improved.  No episodes of fever overnight.  Continue IV fluids for now.  Monitor urine output.  Follow-up on culture data.  ADDENDUM: Called urology office and they did have results of the urine culture sent on 12/11.  Urine growing Enterobacter aerogenes which is sensitive to Cipro and ceftriaxone, amongst multiple other antibiotics.  Resistant to Augmentin, Unasyn, ampicillin, cefazolin and cefotetan.  Patient already on ciprofloxacin which will be continued.  Wait on final identification of the blood cultures.  Transaminitis Rise in LFTs could be due to hypotension and sepsis.  Statin has been placed on hold.  Continue to trend LFTs.  May need to consider imaging studies depending on the trend.  His abdomen is benign at this time.  No right upper quadrant tenderness.  Hematuria Most likely due to UTI and prostatitis.  Hemoglobin is stable.  History of  dyslipidemia Holding statin due to elevated transaminases.  Thrombocytopenia Platelet counts are noted to be slightly low.  Monitor for now.  Continue with the prophylactic doses of Lovenox for now.  Normal anion gap metabolic acidosis This is mild.  Most likely due to sepsis and hypovolemia.  Continue to monitor.  Normocytic anemia Stable.  History of BPH Noted to be on Flomax which will be continued for now.  DVT Prophylaxis: Lovenox    Code Status: Full code Family Communication: Discussed with the patient and his wife Disposition Plan: Management as outlined above.  If he remains hemodynamically stable he could be transferred to floor later today.    LOS: 1 day   [facility] Pager [phone] 09/05/2018, 11:24 AM  If 7PM-7AM, please contact night-coverage at [email or web address], password [facility]

## 2018-09-05 NOTE — Progress Notes (Signed)
PHARMACY - PHYSICIAN COMMUNICATION CRITICAL VALUE ALERT - BLOOD CULTURE IDENTIFICATION (BCID)  Brandon Acosta is an 77 y.o. male who presented to [facility] on 09/04/2018 with a chief complaint of weakness and hematuria. Treating for UTI vs prostatitis  Assessment:  GNR bacteremia (suspect urinary/prostate source)  Name of physician (or Provider) Contacted: [place] Pink, G  Current antibiotics: Cipro 400 mg IV bid  Changes to prescribed antibiotics recommended: Cipro as above is reasonable to cover GNR bacteremia esp since unknown organism, and to cover for prostatitis - no changes recommended.   Results for orders placed or performed during the hospital encounter of 09/04/18  Blood Culture ID Panel (Reflexed) (Collected: 09/04/2018  3:49 PM)  Result Value Ref Range   Enterococcus species NOT DETECTED NOT DETECTED   Listeria monocytogenes NOT DETECTED NOT DETECTED   Staphylococcus species NOT DETECTED NOT DETECTED   Staphylococcus aureus (BCID) NOT DETECTED NOT DETECTED   Streptococcus species NOT DETECTED NOT DETECTED   Streptococcus agalactiae NOT DETECTED NOT DETECTED   Streptococcus pneumoniae NOT DETECTED NOT DETECTED   Streptococcus pyogenes NOT DETECTED NOT DETECTED   Acinetobacter baumannii NOT DETECTED NOT DETECTED   Enterobacteriaceae species DETECTED (A) NOT DETECTED   Enterobacter cloacae complex NOT DETECTED NOT DETECTED   Escherichia coli NOT DETECTED NOT DETECTED   Klebsiella oxytoca NOT DETECTED NOT DETECTED   Klebsiella pneumoniae NOT DETECTED NOT DETECTED   Proteus species NOT DETECTED NOT DETECTED   Serratia marcescens NOT DETECTED NOT DETECTED   Carbapenem resistance NOT DETECTED NOT DETECTED   Haemophilus influenzae NOT DETECTED NOT DETECTED   Neisseria meningitidis NOT DETECTED NOT DETECTED   Pseudomonas aeruginosa NOT DETECTED NOT DETECTED   Candida albicans NOT DETECTED NOT DETECTED   Candida glabrata NOT DETECTED NOT DETECTED   Candida krusei NOT  DETECTED NOT DETECTED   Candida parapsilosis NOT DETECTED NOT DETECTED   Candida tropicalis NOT DETECTED NOT DETECTED    Michelle Wnek A 09/05/2018  12:57 PM

## 2018-09-06 DIAGNOSIS — R740 Nonspecific elevation of levels of transaminase and lactic acid dehydrogenase [LDH]: Secondary | ICD-10-CM

## 2018-09-06 LAB — IRON AND TIBC
Iron: 9 ug/dL — ABNORMAL LOW (ref 45–182)
Saturation Ratios: 5 % — ABNORMAL LOW (ref 17.9–39.5)
TIBC: 179 ug/dL — ABNORMAL LOW (ref 250–450)
UIBC: 170 ug/dL

## 2018-09-06 LAB — COMPREHENSIVE METABOLIC PANEL
ALBUMIN: 2.7 g/dL — AB (ref 3.5–5.0)
ALT: 234 U/L — ABNORMAL HIGH (ref 0–44)
AST: 185 U/L — ABNORMAL HIGH (ref 15–41)
Alkaline Phosphatase: 85 U/L (ref 38–126)
Anion gap: 8 (ref 5–15)
BILIRUBIN TOTAL: 0.5 mg/dL (ref 0.3–1.2)
BUN: 18 mg/dL (ref 8–23)
CO2: 19 mmol/L — ABNORMAL LOW (ref 22–32)
Calcium: 8 mg/dL — ABNORMAL LOW (ref 8.9–10.3)
Chloride: 108 mmol/L (ref 98–111)
Creatinine, Ser: 0.87 mg/dL (ref 0.61–1.24)
GFR calc Af Amer: >60 mL/min (ref >60)
GFR calc non Af Amer: >60 mL/min (ref >60)
Glucose, Bld: 101 mg/dL — ABNORMAL HIGH (ref 70–99)
Potassium: 3.8 mmol/L (ref 3.5–5.1)
Sodium: 135 mmol/L (ref 135–145)
Total Protein: 5.1 g/dL — ABNORMAL LOW (ref 6.5–8.1)

## 2018-09-06 LAB — CBC
HEMATOCRIT: 33.3 % — AB (ref 39.0–52.0)
Hemoglobin: 10.7 g/dL — ABNORMAL LOW (ref 13.0–17.0)
MCH: 33.3 pg (ref 26.0–34.0)
MCHC: 32.1 g/dL (ref 30.0–36.0)
MCV: 103.7 fL — ABNORMAL HIGH (ref 80.0–100.0)
Platelets: 133 10*3/uL — ABNORMAL LOW (ref 150–400)
RBC: 3.21 MIL/uL — ABNORMAL LOW (ref 4.22–5.81)
RDW: 13.6 % (ref 11.5–15.5)
WBC: 6.3 10*3/uL (ref 4.0–10.5)
nRBC: 0 % (ref 0.0–0.2)

## 2018-09-06 LAB — FERRITIN: Ferritin: 471 ng/mL — ABNORMAL HIGH (ref 24–336)

## 2018-09-06 LAB — FOLATE: Folate: 14.7 ng/mL (ref >5.9)

## 2018-09-06 LAB — RETICULOCYTES
IMMATURE RETIC FRACT: 18.2 % — AB (ref 2.3–15.9)
RBC.: 3.21 MIL/uL — ABNORMAL LOW (ref 4.22–5.81)
Retic Count, Absolute: 37.9 10*3/uL (ref 19.0–186.0)
Retic Ct Pct: 1.2 % (ref 0.4–3.1)

## 2018-09-06 LAB — VITAMIN B12: VITAMIN B 12: 379 pg/mL (ref 180–914)

## 2018-09-06 NOTE — Progress Notes (Signed)
Report given to [facility] on 5W. Pt request to eat breakfast before transfer. RN made aware.

## 2018-09-06 NOTE — Progress Notes (Signed)
[facility] PROGRESS NOTE  Brandon Acosta WUG:[id number] DOB: 1941-05-06 DOA: 09/04/2018  PCP: [name], MD  Brief History/Interval Summary: 77 y.o. male, with history of BPH and dyslipidemia who presented to the ED with fevers and chills.  Patient reported history of UTI with gross hematuria about 2 years back for which he was treated with either Cipro or Bactrim by his urologist and recovered.  He reports several episodes of UTI in the past 2 years without hematuria and has been treated by either a quinolone or Bactrim.    The day prior to admission he had a sudden onset of hematuria (gross) for which he went to see his urologist Dr. [name] PA.  He had urine culture sent from the office and prescribed doxycycline.  Patient took total 3 doses.    Since symptoms were not improving he decided to come into the emergency department.  Patient was found to be febrile hypotensive tachypneic.  There was concern for sepsis.  He was hospitalized for further management.  He grew gram-negative bacteria in his blood.  Reason for Visit: Sepsis due to UTI versus prostatitis, gram-negative bacteremia  Consultants: None  Procedures: None  Antibiotics: Intravenous ciprofloxacin  Subjective/Interval History: Still with occasional chills.  Occasional difficulty with taking deep breaths however denies any shortness of breath or chest pain currently.  No nausea or vomiting.  Wife is at the bedside..  ROS: Denies any headaches.  Objective:  Vital Signs  Vitals:   09/06/18 0600 09/06/18 0720 09/06/18 0800 09/06/18 0900  BP: (!) 131/59  128/65 116/60  Pulse: (!) 56  (!) 57 (!) 56  Resp: (!) 23  20 18   Temp:  98.1 F (36.7 C)  97.9 F (36.6 C)  TempSrc:  Oral  Oral  SpO2: 99%  96% 100%  Weight:      Height:        Intake/Output Summary (Last 24 hours) at 09/06/2018 1050 Last data filed at 09/06/2018 0900 Gross per 24 hour  Intake 2208.33 ml  Output 1640 ml  Net 568.33 ml    Filed Weights   09/04/18 1641 09/05/18 1006  Weight: 68 kg 68 kg    General appearance: Awake alert.  In no distress Resp: Normal effort at rest.  Diminished air entry at the bases.  Clear to auscultation bilaterally. Cardio: S1-S2 is normal regular.  No S3-S4.  No rubs murmurs or bruit GI: Abdomen soft.  Nontender nondistended.  No masses organomegaly. Extremities: No edema Neurologic: Alert and oriented x3.  No obvious focal neurological deficits   Lab Results:  Data Reviewed: I have personally reviewed following labs and imaging studies  CBC: Recent Labs  Lab 09/04/18 1543 09/05/18 0336 09/06/18 0254  WBC 10.9* 11.6* 6.3  NEUTROABS 10.3*  --   --   HGB 11.7* 11.2* 10.7*  HCT 35.6* 35.6* 33.3*  MCV 101.4* 106.3* 103.7*  PLT 134* 113* 133*    Basic Metabolic Panel: Recent Labs  Lab 09/04/18 1543 09/05/18 0336 09/06/18 0254  NA 136 136 135  K 3.6 4.7 3.8  CL 105 112* 108  CO2 22 18* 19*  GLUCOSE 109* 120* 101*  BUN 31* 24* 18  CREATININE 1.03 0.83 0.87  CALCIUM 8.6* 7.9* 8.0*    GFR: Estimated Creatinine Clearance: 69.5 mL/min (by C-G formula based on SCr of 0.87 mg/dL).  Liver Function Tests: Recent Labs  Lab 09/04/18 1543 09/05/18 0336 09/06/18 0254  AST 62* 243* 185*  ALT 66* 242* 234*  ALKPHOS 73  83 85  BILITOT 1.1 1.2 0.5  PROT 5.7* 4.9* 5.1*  ALBUMIN 3.2* 2.6* 2.7*    Coagulation Profile: Recent Labs  Lab 09/04/18 1543  INR 1.45     Recent Results (from the past 240 hour(s))  Culture, blood (Routine x 2)     Status: None (Preliminary result)   Collection Time: 09/04/18  3:49 PM  Result Value Ref Range Status   Specimen Description   Final    BLOOD BLOOD RIGHT FOREARM Performed at [facility] [address], [place], [place] [address]    Special Requests   Final    BOTTLES DRAWN AEROBIC AND ANAEROBIC Blood Culture adequate volume Performed at [facility] [address],  [place], [place] [address]    Culture  Setup Time   Final    GRAM NEGATIVE RODS IN BOTH AEROBIC AND ANAEROBIC BOTTLES CRITICAL RESULT CALLED TO, READ BACK BY AND VERIFIED WITH: Chales Abrahams PharmD 12:35 09/05/18 ([email or web address])    Culture GRAM NEGATIVE RODS  Final   Report Status PENDING  Incomplete  Culture, blood (Routine x 2)     Status: None (Preliminary result)   Collection Time: 09/04/18  3:49 PM  Result Value Ref Range Status   Specimen Description   Final    BLOOD BLOOD LEFT FOREARM Performed at [facility], [place] [address] [place], [place] [address]    Special Requests   Final    BOTTLES DRAWN AEROBIC AND ANAEROBIC Blood Culture adequate volume Performed at [facility] [address] [place], [place] [address]    Culture  Setup Time   Final    GRAM NEGATIVE RODS IN BOTH AEROBIC AND ANAEROBIC BOTTLES CRITICAL VALUE NOTED.  VALUE IS CONSISTENT WITH PREVIOUSLY REPORTED AND CALLED VALUE.    Culture GRAM NEGATIVE RODS  Final   Report Status PENDING  Incomplete  Blood Culture ID Panel (Reflexed)     Status: Abnormal   Collection Time: 09/04/18  3:49 PM  Result Value Ref Range Status   Enterococcus species NOT DETECTED NOT DETECTED Final   Listeria monocytogenes NOT DETECTED NOT DETECTED Final   Staphylococcus species NOT DETECTED NOT DETECTED Final   Staphylococcus aureus (BCID) NOT DETECTED NOT DETECTED Final   Streptococcus species NOT DETECTED NOT DETECTED Final   Streptococcus agalactiae NOT DETECTED NOT DETECTED Final   Streptococcus pneumoniae NOT DETECTED NOT DETECTED Final   Streptococcus pyogenes NOT DETECTED NOT DETECTED Final   Acinetobacter baumannii NOT DETECTED NOT DETECTED Final   Enterobacteriaceae species DETECTED (A) NOT DETECTED Final    Comment: Enterobacteriaceae represent a large family of gram negative bacteria, not a single organism. Refer to culture for further identification. CRITICAL RESULT CALLED TO, READ BACK BY AND  VERIFIED WITH: Chales Abrahams PharmD 12:35 09/05/18 ([email or web address])    Enterobacter cloacae complex NOT DETECTED NOT DETECTED Final   Escherichia coli NOT DETECTED NOT DETECTED Final   Klebsiella oxytoca NOT DETECTED NOT DETECTED Final   Klebsiella pneumoniae NOT DETECTED NOT DETECTED Final   Proteus species NOT DETECTED NOT DETECTED Final   Serratia marcescens NOT DETECTED NOT DETECTED Final   Carbapenem resistance NOT DETECTED NOT DETECTED Final   Haemophilus influenzae NOT DETECTED NOT DETECTED Final   Neisseria meningitidis NOT DETECTED NOT DETECTED Final   Pseudomonas aeruginosa NOT DETECTED NOT DETECTED Final   Candida albicans NOT DETECTED NOT DETECTED Final   Candida glabrata NOT DETECTED NOT DETECTED Final   Candida krusei NOT DETECTED NOT DETECTED Final  Candida parapsilosis NOT DETECTED NOT DETECTED Final   Candida tropicalis NOT DETECTED NOT DETECTED Final    Comment: Performed at [facility], [place] [address] [place], [place] [address]  MRSA PCR Screening     Status: None   Collection Time: 09/04/18  8:57 PM  Result Value Ref Range Status   MRSA by PCR NEGATIVE NEGATIVE Final    Comment:        The GeneXpert MRSA Assay (FDA approved for NASAL specimens only), is one component of a comprehensive MRSA colonization surveillance program. It is not intended to diagnose MRSA infection nor to guide or monitor treatment for MRSA infections. Performed at [facility], [place] [address] [place],  [address]       Radiology Studies: Dg Chest 2 View  Result Date: 09/04/2018 CLINICAL DATA:  History of recent urinary tract infection, now with tachycardia and tachypnea EXAM: CHEST - 2 VIEW COMPARISON:  Chest x-ray of 03/08/2017 FINDINGS: No pneumonia or effusion is seen. Very mild pulmonary vascular congestion is a consideration. The heart is within upper limits of normal. No acute bony abnormality is seen. IMPRESSION: Question of mild pulmonary vascular  congestion. No pneumonia or pleural effusion. Electronically Signed   By: Ivar Drape M.D.   On: 09/04/2018 16:38     Medications:  Scheduled: . aspirin  81 mg Oral QPM  . enoxaparin (LOVENOX) injection  40 mg Subcutaneous Q24H  . famotidine  20 mg Oral QHS  . oxymetazoline  1 spray Each Nare BID  . tamsulosin  0.4 mg Oral QPM   Continuous: . sodium chloride 100 mL/hr at 09/05/18 1746  . ciprofloxacin Stopped (09/05/18 2310)   QQI:[id number] **OR** acetaminophen, ibuprofen, ondansetron **OR** ondansetron (ZOFRAN) IV    Assessment/Plan:  Sepsis secondary to UTI versus prostatitis/gram-negative bacteremia Blood culture positive for gram-negative bacteria.  Final identification is pending.  Urine culture was sent from his urologist office on 12/11.  That culture is growing Enterobacter aerogenes sensitive to ciprofloxacin.  Continue ciprofloxacin for now.  Await final identification of the bacteremia.  Patient has improved from a hemodynamic standpoint.  WBC is normal this morning.  Continues to require IV antibiotics for now.    Transaminitis Rise in LFTs most likely due to hypotension and sepsis.  Statin has been placed on hold.  LFTs noted to be slightly better today.  Abdomen remains benign.  Hepatitis panel is pending.  Hold off on imaging studies for now.    Hematuria Most likely due to UTI and prostatitis.  Hemoglobin is stable.  Seems to be improving.  History of dyslipidemia Holding statin due to elevated transaminases.  Thrombocytopenia Platelet counts noted to be slightly low.  Stable.  Continue to monitor.   Normal anion gap metabolic acidosis This is mild.  Most likely due to sepsis and hypovolemia.  Stable this morning  Normocytic anemia Mild drop in hemoglobin is likely dilutional.  No evidence for overt bleeding except for mild hematuria which appears to be subsiding.  Anemia panel reviewed.  Ferritin 471.  TIBC 179.  Iron 9.  Folate 14.7.  Vitamin B12  379.  History of BPH Noted to be on Flomax which will be continued for now.  DVT Prophylaxis: Lovenox    Code Status: Full code Family Communication: Discussed with the patient and his wife Disposition Plan: Okay for transfer to floor.  Continue IV antibiotics.  Wait on final identification of the gram-negative bacteria in the bloodstream.  LOS: 2 days   [facility] Pager [phone] 09/06/2018, 10:50 AM  If 7PM-7AM, please contact night-coverage at [email or web address], password [facility]

## 2018-09-07 LAB — CULTURE, BLOOD (ROUTINE X 2)
Special Requests: ADEQUATE
Special Requests: ADEQUATE

## 2018-09-07 LAB — CBC
HCT: 31.5 % — ABNORMAL LOW (ref 39.0–52.0)
Hemoglobin: 10.5 g/dL — ABNORMAL LOW (ref 13.0–17.0)
MCH: 33.7 pg (ref 26.0–34.0)
MCHC: 33.3 g/dL (ref 30.0–36.0)
MCV: 101 fL — AB (ref 80.0–100.0)
NRBC: 0 % (ref 0.0–0.2)
Platelets: 123 10*3/uL — ABNORMAL LOW (ref 150–400)
RBC: 3.12 MIL/uL — ABNORMAL LOW (ref 4.22–5.81)
RDW: 13.6 % (ref 11.5–15.5)
WBC: 4.6 10*3/uL (ref 4.0–10.5)

## 2018-09-07 LAB — COMPREHENSIVE METABOLIC PANEL
ALT: 161 U/L — ABNORMAL HIGH (ref 0–44)
AST: 93 U/L — ABNORMAL HIGH (ref 15–41)
Albumin: 2.7 g/dL — ABNORMAL LOW (ref 3.5–5.0)
Alkaline Phosphatase: 84 U/L (ref 38–126)
Anion gap: 7 (ref 5–15)
BUN: 12 mg/dL (ref 8–23)
CO2: 22 mmol/L (ref 22–32)
Calcium: 7.8 mg/dL — ABNORMAL LOW (ref 8.9–10.3)
Chloride: 107 mmol/L (ref 98–111)
Creatinine, Ser: 0.79 mg/dL (ref 0.61–1.24)
GFR calc Af Amer: >60 mL/min (ref >60)
GFR calc non Af Amer: >60 mL/min (ref >60)
Glucose, Bld: 100 mg/dL — ABNORMAL HIGH (ref 70–99)
Potassium: 3.4 mmol/L — ABNORMAL LOW (ref 3.5–5.1)
Sodium: 136 mmol/L (ref 135–145)
Total Bilirubin: 0.6 mg/dL (ref 0.3–1.2)
Total Protein: 5 g/dL — ABNORMAL LOW (ref 6.5–8.1)

## 2018-09-07 LAB — HEPATITIS PANEL, ACUTE
HCV Ab: <0.1 s/co ratio (ref 0.0–0.9)
HEP B S AG: NEGATIVE
Hep A IgM: NEGATIVE
Hep B C IgM: NEGATIVE

## 2018-09-07 MED ORDER — CIPROFLOXACIN HCL 500 MG PO TABS
500.0000 mg | ORAL_TABLET | Freq: Two times a day (BID) | ORAL | Status: DC
  Administered 2018-09-07 – 2018-09-08 (×3): 500 mg via ORAL
  Filled 2018-09-07 (×3): qty 1

## 2018-09-07 MED ORDER — POTASSIUM CHLORIDE CRYS ER 20 MEQ PO TBCR
40.0000 meq | EXTENDED_RELEASE_TABLET | Freq: Once | ORAL | Status: AC
  Administered 2018-09-07: 40 meq via ORAL
  Filled 2018-09-07: qty 2

## 2018-09-07 NOTE — Progress Notes (Addendum)
[facility] PROGRESS NOTE  Hershey Knauer TKW:[id number] DOB: Jun 11, 1941 DOA: 09/04/2018  PCP: [name], MD  Brief History/Interval Summary: 77 y.o. male, with history of BPH and dyslipidemia who presented to the ED with fevers and chills.  Patient reported history of UTI with gross hematuria about 2 years back for which he was treated with either Cipro or Bactrim by his urologist and recovered.  He reports several episodes of UTI in the past 2 years without hematuria and has been treated by either a quinolone or Bactrim.    The day prior to admission he had a sudden onset of hematuria (gross) for which he went to see his urologist Dr. [name] PA.  He had urine culture sent from the office and prescribed doxycycline.  Patient took total 3 doses.    Since symptoms were not improving he decided to come into the emergency department.  Patient was found to be febrile hypotensive tachypneic.  There was concern for sepsis.  He was hospitalized for further management.  Urine and blood cultures grew Enterobacter.  Reason for Visit: Sepsis due to UTI versus prostatitis, gram-negative bacteremia  Consultants: None  Procedures: None  Antibiotics: Ciprofloxacin  Subjective/Interval History: Patient continues to feel better.  No chills since yesterday morning.  No shortness of breath.  No nausea or vomiting.    ROS: Denies any abdominal pain..  Objective:  Vital Signs  Vitals:   09/06/18 0900 09/06/18 1340 09/06/18 2123 09/07/18 0601  BP: 116/60 108/61 (!) 118/58 128/74  Pulse: (!) 56 (!) 59 61 (!) 52  Resp: 18 16 16 16   Temp: 97.9 F (36.6 C) (!) 97.4 F (36.3 C) 99.5 F (37.5 C) 99 F (37.2 C)  TempSrc: Oral  Oral Oral  SpO2: 100% 98% 94% 94%  Weight:      Height:        Intake/Output Summary (Last 24 hours) at 09/07/2018 0934 Last data filed at 09/07/2018 0800 Gross per 24 hour  Intake 2623.33 ml  Output 2950 ml  Net -326.67 ml   Filed Weights   09/04/18  1641 09/05/18 1006  Weight: 68 kg 68 kg    General appearance: Awake alert.  In no distress Resp: Normal effort at rest.  Diminished air entry at the bases.  Clear to auscultation bilaterally. Cardio: S1-S2 is normal regular.  No S3-S4.  No rubs murmurs or bruit GI: Abdomen is soft.  Nontender nondistended Neurologic: Alert and oriented x3.  No focal neurological deficits.   Lab Results:  Data Reviewed: I have personally reviewed following labs and imaging studies  CBC: Recent Labs  Lab 09/04/18 1543 09/05/18 0336 09/06/18 0254 09/07/18 0758  WBC 10.9* 11.6* 6.3 4.6  NEUTROABS 10.3*  --   --   --   HGB 11.7* 11.2* 10.7* 10.5*  HCT 35.6* 35.6* 33.3* 31.5*  MCV 101.4* 106.3* 103.7* 101.0*  PLT 134* 113* 133* 123*    Basic Metabolic Panel: Recent Labs  Lab 09/04/18 1543 09/05/18 0336 09/06/18 0254 09/07/18 0758  NA 136 136 135 136  K 3.6 4.7 3.8 3.4*  CL 105 112* 108 107  CO2 22 18* 19* 22  GLUCOSE 109* 120* 101* 100*  BUN 31* 24* 18 12  CREATININE 1.03 0.83 0.87 0.79  CALCIUM 8.6* 7.9* 8.0* 7.8*    GFR: Estimated Creatinine Clearance: 75.6 mL/min (by C-G formula based on SCr of 0.79 mg/dL).  Liver Function Tests: Recent Labs  Lab 09/04/18 1543 09/05/18 0336 09/06/18 0254 09/07/18 0758  AST  62* 243* 185* 93*  ALT 66* 242* 234* 161*  ALKPHOS 73 83 85 84  BILITOT 1.1 1.2 0.5 0.6  PROT 5.7* 4.9* 5.1* 5.0*  ALBUMIN 3.2* 2.6* 2.7* 2.7*    Coagulation Profile: Recent Labs  Lab 09/04/18 1543  INR 1.45     Recent Results (from the past 240 hour(s))  Culture, blood (Routine x 2)     Status: Abnormal   Collection Time: 09/04/18  3:49 PM  Result Value Ref Range Status   Specimen Description   Final    BLOOD BLOOD RIGHT FOREARM Performed at [facility] [address], [place], [place] [address]    Special Requests   Final    BOTTLES DRAWN AEROBIC AND ANAEROBIC Blood Culture adequate volume Performed at [facility] [address], [place], [place] [address]    Culture  Setup Time   Final    GRAM NEGATIVE RODS IN BOTH AEROBIC AND ANAEROBIC BOTTLES CRITICAL RESULT CALLED TO, READ BACK BY AND VERIFIED WITH: Chales Abrahams PharmD 12:35 09/05/18 ([email or web address])    Culture ENTEROBACTER AEROGENES (A)  Final   Report Status 09/07/2018 FINAL  Final   Organism ID, Bacteria ENTEROBACTER AEROGENES  Final      Susceptibility   Enterobacter aerogenes - MIC*    CEFAZOLIN >=64 RESISTANT Resistant     CEFEPIME <=1 SENSITIVE Sensitive     CEFTAZIDIME <=1 SENSITIVE Sensitive     CEFTRIAXONE <=1 SENSITIVE Sensitive     CIPROFLOXACIN <=0.25 SENSITIVE Sensitive     GENTAMICIN <=1 SENSITIVE Sensitive     IMIPENEM 1 SENSITIVE Sensitive     TRIMETH/SULFA <=20 SENSITIVE Sensitive     PIP/TAZO <=4 SENSITIVE Sensitive     * ENTEROBACTER AEROGENES  Culture, blood (Routine x 2)     Status: Abnormal   Collection Time: 09/04/18  3:49 PM  Result Value Ref Range Status   Specimen Description   Final    BLOOD BLOOD LEFT FOREARM Performed at [facility], [place] [address] [place], [place] [address]    Special Requests   Final    BOTTLES DRAWN AEROBIC AND ANAEROBIC Blood Culture adequate volume Performed at [facility] [address], [place], [place] [address]    Culture  Setup Time   Final    GRAM NEGATIVE RODS IN BOTH AEROBIC AND ANAEROBIC BOTTLES CRITICAL VALUE NOTED.  VALUE IS CONSISTENT WITH PREVIOUSLY REPORTED AND CALLED VALUE.    Culture (A)  Final    ENTEROBACTER AEROGENES SUSCEPTIBILITIES PERFORMED ON PREVIOUS CULTURE WITHIN THE LAST 5 DAYS. Performed at [facility], [place] [address] [place], [place] [address]    Report Status 09/07/2018 FINAL  Final  Blood Culture ID Panel (Reflexed)     Status: Abnormal   Collection Time: 09/04/18  3:49 PM  Result Value Ref Range Status   Enterococcus species NOT DETECTED NOT DETECTED Final   Listeria monocytogenes NOT DETECTED  NOT DETECTED Final   Staphylococcus species NOT DETECTED NOT DETECTED Final   Staphylococcus aureus (BCID) NOT DETECTED NOT DETECTED Final   Streptococcus species NOT DETECTED NOT DETECTED Final   Streptococcus agalactiae NOT DETECTED NOT DETECTED Final   Streptococcus pneumoniae NOT DETECTED NOT DETECTED Final   Streptococcus pyogenes NOT DETECTED NOT DETECTED Final   Acinetobacter baumannii NOT DETECTED NOT DETECTED Final   Enterobacteriaceae species DETECTED (A) NOT DETECTED Final    Comment: Enterobacteriaceae represent a large family of gram negative bacteria, not a single organism.  Refer to culture for further identification. CRITICAL RESULT CALLED TO, READ BACK BY AND VERIFIED WITH: Chales Abrahams PharmD 12:35 09/05/18 ([email or web address])    Enterobacter cloacae complex NOT DETECTED NOT DETECTED Final   Escherichia coli NOT DETECTED NOT DETECTED Final   Klebsiella oxytoca NOT DETECTED NOT DETECTED Final   Klebsiella pneumoniae NOT DETECTED NOT DETECTED Final   Proteus species NOT DETECTED NOT DETECTED Final   Serratia marcescens NOT DETECTED NOT DETECTED Final   Carbapenem resistance NOT DETECTED NOT DETECTED Final   Haemophilus influenzae NOT DETECTED NOT DETECTED Final   Neisseria meningitidis NOT DETECTED NOT DETECTED Final   Pseudomonas aeruginosa NOT DETECTED NOT DETECTED Final   Candida albicans NOT DETECTED NOT DETECTED Final   Candida glabrata NOT DETECTED NOT DETECTED Final   Candida krusei NOT DETECTED NOT DETECTED Final   Candida parapsilosis NOT DETECTED NOT DETECTED Final   Candida tropicalis NOT DETECTED NOT DETECTED Final    Comment: Performed at [facility], [place] [address], [place], [place] [address]  MRSA PCR Screening     Status: None   Collection Time: 09/04/18  8:57 PM  Result Value Ref Range Status   MRSA by PCR NEGATIVE NEGATIVE Final    Comment:        The GeneXpert MRSA Assay (FDA approved for NASAL specimens only), is one component of a comprehensive MRSA  colonization surveillance program. It is not intended to diagnose MRSA infection nor to guide or monitor treatment for MRSA infections. Performed at [facility], [place] [address], [place], [place] [address]       Radiology Studies: No results found.   Medications:  Scheduled: . aspirin  81 mg Oral QPM  . ciprofloxacin  500 mg Oral BID  . enoxaparin (LOVENOX) injection  40 mg Subcutaneous Q24H  . famotidine  20 mg Oral QHS  . oxymetazoline  1 spray Each Nare BID  . potassium chloride  40 mEq Oral Once  . tamsulosin  0.4 mg Oral QPM   Continuous:  WVP:[id number] **OR** acetaminophen, ibuprofen, ondansetron **OR** ondansetron (ZOFRAN) IV    Assessment/Plan:  Sepsis secondary to UTI versus prostatitis/Enterobacter aerogenes bacteremia He was hypotensive requiring IV fluids.  Patient was hospitalized and placed on intravenous ciprofloxacin.  Urine culture report available from urology office.  Growing Enterobacter aerogenes.  Sensitive to Cipro.  He also was noted to have bacteremia.  Identification shows same bacteria in the bloodstream.  Patient has improved with IV antibiotics.  His WBC is now normal.  He will be changed over to oral ciprofloxacin today.  Discontinue IV fluids.  Transaminitis Rise in LFTs most likely due to hypotension and sepsis.  Patient statin was placed on hold.  LFTs improving.  Hepatitis panel unremarkable.  No need for any imaging studies since his numbers are improving.   Hematuria Most likely due to UTI and prostatitis.  Hemoglobin is stable.  Seems to be improving.  History of dyslipidemia Holding statin due to elevated transaminases.  Thrombocytopenia Platelet counts noted to be slightly low but stable.  Normal anion gap metabolic acidosis Most likely due to sepsis and hypovolemia.  Resolved this morning  Normocytic anemia Mild drop in hemoglobin is likely dilutional.  No evidence for overt bleeding except for mild  hematuria which appears to be subsiding.  Anemia panel reviewed.  Ferritin 471.  TIBC 179.  Iron 9.  Folate 14.7.  Vitamin B12 379.  Hemoglobin is stable.  History of BPH Noted to be on Flomax which  will be continued for now.  Hypokalemia Will be repleted.  DVT Prophylaxis: Lovenox    Code Status: Full code Family Communication: Discussed with the patient and his wife Disposition Plan: Change to oral ciprofloxacin today.  Mobilize.  Stop IV fluids.    LOS: 3 days   [facility] Pager [phone] 09/07/2018, 9:34 AM  If 7PM-7AM, please contact night-coverage at [email or web address], password [facility]

## 2018-09-08 DIAGNOSIS — A498 Other bacterial infections of unspecified site: Secondary | ICD-10-CM

## 2018-09-08 MED ORDER — SIMVASTATIN 40 MG PO TABS: 40.0000 mg | ORAL_TABLET | Freq: Every evening | ORAL | Status: DC

## 2018-09-08 MED ORDER — CIPROFLOXACIN HCL 500 MG PO TABS: 500.0000 mg | ORAL_TABLET | Freq: Two times a day (BID) | ORAL | 0 refills | Status: AC

## 2018-09-08 NOTE — Discharge Instructions (Signed)
Prostatitis Prostatitis is swelling of the prostate gland. The prostate helps to make semen. It is below a man's bladder, in front of the rectum. There are different types of prostatitis. Follow these instructions at home:  Take over-the-counter and prescription medicines only as told by your doctor.  If you were prescribed an antibiotic medicine, take it as told by your doctor. Do not stop taking the antibiotic even if you start to feel better.  If your doctor prescribed exercises, do them as directed.  Take sitz baths as told by your doctor. To take a sitz bath, sit in warm water that is deep enough to cover your hips and butt.  Keep all follow-up visits as told by your doctor. This is important. Contact a doctor if:  Your symptoms get worse.  You have a fever. Get help right away if:  You have chills.  You feel sick to your stomach (nauseous).  You throw up (vomit).  You feel light-headed.  You feel like you might pass out (faint).  You cannot pee (urinate).  You have blood or clumps of blood (blood clots) in your pee (urine). This information is not intended to replace advice given to you by your health care provider. Make sure you discuss any questions you have with your health care provider. Document Released: 03/11/2012 Document Revised: 05/31/2016 Document Reviewed: 05/31/2016 Elsevier Interactive Patient Education  2017 [employer]   Urinary Tract Infection, Adult A urinary tract infection (UTI) is an infection of any part of the urinary tract. The urinary tract includes the:  Kidneys.  Ureters.  Bladder.  Urethra.  These organs make, store, and get rid of pee (urine) in the body. Follow these instructions at home:  Take over-the-counter and prescription medicines only as told by your doctor.  If you were prescribed an antibiotic medicine, take it as told by your doctor. Do not stop taking the antibiotic even if you start to feel better.  Avoid the  following drinks: ? Alcohol. ? Caffeine. ? Tea. ? Carbonated drinks.  Drink enough fluid to keep your pee clear or pale yellow.  Keep all follow-up visits as told by your doctor. This is important.  Make sure to: ? Empty your bladder often and completely. Do not to hold pee for long periods of time. ? Empty your bladder before and after sex. ? Wipe from front to back after a bowel movement if you are male. Use each tissue one time when you wipe. Contact a doctor if:  You have back pain.  You have a fever.  You feel sick to your stomach (nauseous).  You throw up (vomit).  Your symptoms do not get better after 3 days.  Your symptoms go away and then come back. Get help right away if:  You have very bad back pain.  You have very bad lower belly (abdominal) pain.  You are throwing up and cannot keep down any medicines or water. This information is not intended to replace advice given to you by your health care provider. Make sure you discuss any questions you have with your health care provider. Document Released: 02/27/2008 Document Revised: 02/16/2016 Document Reviewed: 08/01/2015 Elsevier Interactive Patient Education  [employer].

## 2018-09-08 NOTE — Discharge Summary (Signed)
[facility]  Physician Discharge Summary   Patient ID: Brandon Acosta MRN: [id number] DOB/AGE: Jun 10, 1941 77 y.o.  Admit date: 09/04/2018 Discharge date: 09/08/2018  PCP: [name], MD  DISCHARGE DIAGNOSES:  Enterobacter aerogenes bacteremia UTI versus prostatitis Transaminitis due to sepsis, improving Thrombocytopenia, stable    RECOMMENDATIONS FOR OUTPATIENT FOLLOW UP: 1. Outpatient follow-up with PCP and urology.   DISCHARGE CONDITION: fair  Diet recommendation: As before  Filed Weights   09/04/18 1641 09/05/18 1006  Weight: 68 kg 68 kg    INITIAL HISTORY: 77 y.o.male,with history of BPH and dyslipidemia who presented to the ED with fevers and chills. Patient reported history of UTI with gross hematuria about 2 years back for which he was treated with either Cipro or Bactrim by his urologist and recovered. He reports several episodes of UTI in the past 2 years without hematuria and has been treated by either a quinolone or Bactrim.   The day prior to admission he had a sudden onset of hematuria (gross) for which he went to see his urologist Dr. [name] PA.He had urine culture sent from the office and prescribed doxycycline. Patient took total 3 doses.   Since symptoms were not improving he decided to come into the emergency department.  Patient was found to be febrile hypotensive tachypneic.  There was concern for sepsis.  He was hospitalized for further management.  Urine and blood cultures grew Enterobacter.   HOSPITAL COURSE:   Sepsis secondary to UTI versus prostatitis/Enterobacter aerogenes bacteremia She was initially hypotensive.  He was admitted to stepdown unit.  He was given IV fluids.  He stabilized.  He was placed on intravenous ciprofloxacin.  Urine culture report available from urology office.  Growing Enterobacter aerogenes.  Sensitive to Cipro.  He also was noted to have bacteremia.  Identification shows same bacteria in the  bloodstream.  Patient improved with IV antibiotics.  His WBC became normal.  He was switched over to oral ciprofloxacin.  Will be continued on the same for 7 more days.  Outpatient follow-up with PCP and urology.  Transaminitis Rise in LFTs most likely due to hypotension and sepsis.  Patient statin was placed on hold.  LFTs improving.  Hepatitis panel unremarkable.  No need for any imaging studies since his numbers are improving.   May resume his statin after a week.  Hematuria Most likely due to UTI and prostatitis.    Resolved  History of dyslipidemia Resume statin after a week  Thrombocytopenia Platelet counts noted to be slightly low but stable.  Normal anion gap metabolic acidosis Most likely due to sepsis and hypovolemia.  Resolved.  Normocytic anemia Mild drop in hemoglobin is likely dilutional.  No evidence for overt bleeding except for mild hematuria which appears to be subsiding.  Anemia panel reviewed.  Ferritin 471.  TIBC 179.  Iron 9.  Folate 14.7.  Vitamin B12 379.  Hemoglobin is stable.  History of BPH Continue Flomax  Hypokalemia Repleted.  Patient improved.  Feels much better.  Has ambulated.  Okay for discharge home today.     PERTINENT LABS:  The results of significant diagnostics from this hospitalization (including imaging, microbiology, ancillary and laboratory) are listed below for reference.    Microbiology: Recent Results (from the past 240 hour(s))  Culture, blood (Routine x 2)     Status: Abnormal   Collection Time: 09/04/18  3:49 PM  Result Value Ref Range Status   Specimen Description   Final    BLOOD BLOOD RIGHT FOREARM  Performed at [facility], [place] [address] [place], [place] [address]    Special Requests   Final    BOTTLES DRAWN AEROBIC AND ANAEROBIC Blood Culture adequate volume Performed at [place] [address] [place], [place] [address]    Culture  Setup Time   Final    GRAM  NEGATIVE RODS IN BOTH AEROBIC AND ANAEROBIC BOTTLES CRITICAL RESULT CALLED TO, READ BACK BY AND VERIFIED WITH: Chales Abrahams PharmD 12:35 09/05/18 ([email or web address])    Culture ENTEROBACTER AEROGENES (A)  Final   Report Status 09/07/2018 FINAL  Final   Organism ID, Bacteria ENTEROBACTER AEROGENES  Final      Susceptibility   Enterobacter aerogenes - MIC*    CEFAZOLIN >=64 RESISTANT Resistant     CEFEPIME <=1 SENSITIVE Sensitive     CEFTAZIDIME <=1 SENSITIVE Sensitive     CEFTRIAXONE <=1 SENSITIVE Sensitive     CIPROFLOXACIN <=0.25 SENSITIVE Sensitive     GENTAMICIN <=1 SENSITIVE Sensitive     IMIPENEM 1 SENSITIVE Sensitive     TRIMETH/SULFA <=20 SENSITIVE Sensitive     PIP/TAZO <=4 SENSITIVE Sensitive     * ENTEROBACTER AEROGENES  Culture, blood (Routine x 2)     Status: Abnormal   Collection Time: 09/04/18  3:49 PM  Result Value Ref Range Status   Specimen Description   Final    BLOOD BLOOD LEFT FOREARM Performed at [facility], [place] [address] [place], [place] [address]    Special Requests   Final    BOTTLES DRAWN AEROBIC AND ANAEROBIC Blood Culture adequate volume Performed at [place] [address], [place], [place] [address]    Culture  Setup Time   Final    GRAM NEGATIVE RODS IN BOTH AEROBIC AND ANAEROBIC BOTTLES CRITICAL VALUE NOTED.  VALUE IS CONSISTENT WITH PREVIOUSLY REPORTED AND CALLED VALUE.    Culture (A)  Final    ENTEROBACTER AEROGENES SUSCEPTIBILITIES PERFORMED ON PREVIOUS CULTURE WITHIN THE LAST 5 DAYS. Performed at [facility], [place] [address], [place], [place] [address]    Report Status 09/07/2018 FINAL  Final  Blood Culture ID Panel (Reflexed)     Status: Abnormal   Collection Time: 09/04/18  3:49 PM  Result Value Ref Range Status   Enterococcus species NOT DETECTED NOT DETECTED Final   Listeria monocytogenes NOT DETECTED NOT DETECTED Final   Staphylococcus species NOT DETECTED NOT DETECTED Final   Staphylococcus  aureus (BCID) NOT DETECTED NOT DETECTED Final   Streptococcus species NOT DETECTED NOT DETECTED Final   Streptococcus agalactiae NOT DETECTED NOT DETECTED Final   Streptococcus pneumoniae NOT DETECTED NOT DETECTED Final   Streptococcus pyogenes NOT DETECTED NOT DETECTED Final   Acinetobacter baumannii NOT DETECTED NOT DETECTED Final   Enterobacteriaceae species DETECTED (A) NOT DETECTED Final    Comment: Enterobacteriaceae represent a large family of gram negative bacteria, not a single organism. Refer to culture for further identification. CRITICAL RESULT CALLED TO, READ BACK BY AND VERIFIED WITH: Chales Abrahams PharmD 12:35 09/05/18 ([email or web address])    Enterobacter cloacae complex NOT DETECTED NOT DETECTED Final   Escherichia coli NOT DETECTED NOT DETECTED Final   Klebsiella oxytoca NOT DETECTED NOT DETECTED Final   Klebsiella pneumoniae NOT DETECTED NOT DETECTED Final   Proteus species NOT DETECTED NOT DETECTED Final   Serratia marcescens NOT DETECTED NOT DETECTED Final   Carbapenem resistance NOT DETECTED NOT DETECTED Final   Haemophilus influenzae NOT DETECTED NOT DETECTED Final   Neisseria  meningitidis NOT DETECTED NOT DETECTED Final   Pseudomonas aeruginosa NOT DETECTED NOT DETECTED Final   Candida albicans NOT DETECTED NOT DETECTED Final   Candida glabrata NOT DETECTED NOT DETECTED Final   Candida krusei NOT DETECTED NOT DETECTED Final   Candida parapsilosis NOT DETECTED NOT DETECTED Final   Candida tropicalis NOT DETECTED NOT DETECTED Final    Comment: Performed at [facility], [place] [address], [place], [place] [address]  MRSA PCR Screening     Status: None   Collection Time: 09/04/18  8:57 PM  Result Value Ref Range Status   MRSA by PCR NEGATIVE NEGATIVE Final    Comment:        The GeneXpert MRSA Assay (FDA approved for NASAL specimens only), is one component of a comprehensive MRSA colonization surveillance program. It is not intended to diagnose MRSA infection nor to guide  or monitor treatment for MRSA infections. Performed at [facility], [place] [address] [place], [place] [address]      Labs: Basic Metabolic Panel: Recent Labs  Lab 09/04/18 1543 09/05/18 0336 09/06/18 0254 09/07/18 0758  NA 136 136 135 136  K 3.6 4.7 3.8 3.4*  CL 105 112* 108 107  CO2 22 18* 19* 22  GLUCOSE 109* 120* 101* 100*  BUN 31* 24* 18 12  CREATININE 1.03 0.83 0.87 0.79  CALCIUM 8.6* 7.9* 8.0* 7.8*   Liver Function Tests: Recent Labs  Lab 09/04/18 1543 09/05/18 0336 09/06/18 0254 09/07/18 0758  AST 62* 243* 185* 93*  ALT 66* 242* 234* 161*  ALKPHOS 73 83 85 84  BILITOT 1.1 1.2 0.5 0.6  PROT 5.7* 4.9* 5.1* 5.0*  ALBUMIN 3.2* 2.6* 2.7* 2.7*   CBC: Recent Labs  Lab 09/04/18 1543 09/05/18 0336 09/06/18 0254 09/07/18 0758  WBC 10.9* 11.6* 6.3 4.6  NEUTROABS 10.3*  --   --   --   HGB 11.7* 11.2* 10.7* 10.5*  HCT 35.6* 35.6* 33.3* 31.5*  MCV 101.4* 106.3* 103.7* 101.0*  PLT 134* 113* 133* 123*     IMAGING STUDIES Dg Chest 2 View  Result Date: 09/04/2018 CLINICAL DATA:  History of recent urinary tract infection, now with tachycardia and tachypnea EXAM: CHEST - 2 VIEW COMPARISON:  Chest x-ray of 03/08/2017 FINDINGS: No pneumonia or effusion is seen. Very mild pulmonary vascular congestion is a consideration. The heart is within upper limits of normal. No acute bony abnormality is seen. IMPRESSION: Question of mild pulmonary vascular congestion. No pneumonia or pleural effusion. Electronically Signed   By: Ivar Drape M.D.   On: 09/04/2018 16:38    DISCHARGE EXAMINATION: Vitals:   09/07/18 0601 09/07/18 1353 09/07/18 2127 09/08/18 0612  BP: 128/74 128/72 127/70 (!) 111/58  Pulse: (!) 52 (!) 59 (!) 51 (!) 45  Resp: 16 16 18 16   Temp: 99 F (37.2 C) (!) 97.5 F (36.4 C) 97.9 F (36.6 C) 98.1 F (36.7 C)  TempSrc: Oral Oral Oral Oral  SpO2: 94% 96% 98% 95%  Weight:      Height:       General appearance: alert, cooperative,  appears stated age and no distress Resp: clear to auscultation bilaterally Cardio: regular rate and rhythm, S1, S2 normal, no murmur, click, rub or gallop GI: soft, non-tender; bowel sounds normal; no masses,  no organomegaly  DISPOSITION: Home  Discharge Instructions    Call MD for:  extreme fatigue   Complete by:  As directed    Call MD for:  persistant dizziness  or light-headedness   Complete by:  As directed    Call MD for:  persistant nausea and vomiting   Complete by:  As directed    Call MD for:  severe uncontrolled pain   Complete by:  As directed    Call MD for:  temperature >100.4   Complete by:  As directed    Diet general   Complete by:  As directed    Discharge instructions   Complete by:  As directed    Please take your medications as prescribed.  Follow-up with your primary care provider within 1 week and with Dr. Diona Fanti in 1 to 2 weeks.  You were cared for by a hospitalist during your hospital stay. If you have any questions about your discharge medications or the care you received while you were in the hospital after you are discharged, you can call the unit and asked to speak with the hospitalist on call if the hospitalist that took care of you is not available. Once you are discharged, your primary care physician will handle any further medical issues. Please note that NO REFILLS for any discharge medications will be authorized once you are discharged, as it is imperative that you return to your primary care physician (or establish a relationship with a primary care physician if you do not have one) for your aftercare needs so that they can reassess your need for medications and monitor your lab values. If you do not have a primary care physician, you can call [phone] for a physician referral.   Increase activity slowly   Complete by:  As directed         Allergies as of 09/08/2018   No Known Allergies     Medication List    STOP taking these medications     doxycycline 100 MG tablet Commonly known as:  ADOXA     TAKE these medications   aspirin 81 MG tablet Take 81 mg by mouth every evening.   ciprofloxacin 500 MG tablet Commonly known as:  CIPRO Take 1 tablet (500 mg total) by mouth 2 (two) times daily for 7 days. START TONIGHT AT 8PM   famotidine 20 MG tablet Commonly known as:  PEPCID Take 20 mg by mouth at bedtime.   ibuprofen 200 MG tablet Commonly known as:  ADVIL,MOTRIN Take 400 mg by mouth daily as needed for fever or moderate pain.   oxymetazoline 0.05 % nasal spray Commonly known as:  AFRIN Place 1 spray into both nostrils 2 (two) times daily.   simvastatin 40 MG tablet Commonly known as:  ZOCOR Take 1 tablet (40 mg total) by mouth every evening. RESUME AFTER 1 WEEK. What changed:  additional instructions   tamsulosin 0.4 MG Caps capsule Commonly known as:  FLOMAX Take 0.4 mg by mouth every evening.        Follow-up Information    Little, Lennette Bihari, MD. Schedule an appointment as soon as possible for a visit in 1 week(s).   Specialty:  Family Medicine Contact information: [place] [address] [phone]        Franchot Gallo, MD. Schedule an appointment as soon as possible for a visit in 2 week(s).   Specialty:  Urology Contact information: [place] [address] [phone]           TOTAL DISCHARGE TIME: 21 mins  [facility] Pager [phone]  09/08/2018, 6:38 PM

## 2018-09-08 NOTE — Progress Notes (Signed)
Discharge instructions discussed with patient and family, verbalized agreement and understanding 

## 2018-09-18 DIAGNOSIS — D696 Thrombocytopenia, unspecified: Secondary | ICD-10-CM | POA: Diagnosis not present

## 2018-09-18 DIAGNOSIS — Z8619 Personal history of other infectious and parasitic diseases: Secondary | ICD-10-CM | POA: Diagnosis not present

## 2018-09-18 DIAGNOSIS — E778 Other disorders of glycoprotein metabolism: Secondary | ICD-10-CM | POA: Diagnosis not present

## 2018-09-18 DIAGNOSIS — R748 Abnormal levels of other serum enzymes: Secondary | ICD-10-CM | POA: Diagnosis not present

## 2018-09-18 DIAGNOSIS — R8290 Unspecified abnormal findings in urine: Secondary | ICD-10-CM | POA: Diagnosis not present

## 2018-09-22 DIAGNOSIS — N139 Obstructive and reflux uropathy, unspecified: Secondary | ICD-10-CM | POA: Diagnosis not present

## 2018-09-22 DIAGNOSIS — N3000 Acute cystitis without hematuria: Secondary | ICD-10-CM | POA: Diagnosis not present

## 2018-10-07 ENCOUNTER — Other Ambulatory Visit (INDEPENDENT_AMBULATORY_CARE_PROVIDER_SITE_OTHER): Payer: Self-pay

## 2018-10-07 ENCOUNTER — Ambulatory Visit (INDEPENDENT_AMBULATORY_CARE_PROVIDER_SITE_OTHER): Payer: Medicare Other

## 2018-10-07 ENCOUNTER — Ambulatory Visit (INDEPENDENT_AMBULATORY_CARE_PROVIDER_SITE_OTHER): Payer: Medicare Other | Admitting: Orthopaedic Surgery

## 2018-10-07 ENCOUNTER — Encounter (INDEPENDENT_AMBULATORY_CARE_PROVIDER_SITE_OTHER): Payer: Self-pay | Admitting: Orthopaedic Surgery

## 2018-10-07 DIAGNOSIS — M5441 Lumbago with sciatica, right side: Secondary | ICD-10-CM

## 2018-10-07 DIAGNOSIS — G8929 Other chronic pain: Secondary | ICD-10-CM

## 2018-10-07 DIAGNOSIS — R1031 Right lower quadrant pain: Secondary | ICD-10-CM | POA: Diagnosis not present

## 2018-10-07 MED ORDER — CYCLOBENZAPRINE HCL 10 MG PO TABS: 10.0000 mg | ORAL_TABLET | Freq: Every day | ORAL | 1 refills | Status: DC

## 2018-10-07 NOTE — Progress Notes (Signed)
Office Visit Note   Patient: Brandon Acosta           Date of Birth: 1941/06/06           MRN: [id number] Visit Date: 10/07/2018              Requested by: [name], MD [place],  [id number] PCP: [name], MD   Assessment & Plan: Visit Diagnoses:  1. Chronic right-sided low back pain with right-sided sciatica   2. Right groin pain     Plan: We will send him to formal physical therapy for home exercise program modalities and core strengthening.  Placed him on Flexeril 10 mg 1 p.o. nightly.  We will follow-up with [place] on as-needed basis pain persist or becomes worse.  If his pain does persist or does not respond well to formal therapy he will call our office and we will obtain a repeat MRI for possible epidural steroid injections.  Questions encouraged and answered at length by Dr. Ninfa Linden myself.  Follow-Up Instructions: Return if symptoms worsen or fail to improve.   Orders:  Orders Placed This Encounter  Procedures  . XR HIP UNILAT W OR W/O PELVIS 1V RIGHT  . XR Lumbar Spine 2-3 Views   No orders of the defined types were placed in this encounter.     Procedures: No procedures performed   Clinical Data: No additional findings.   Subjective: Chief Complaint  Patient presents with  . Lower Back - Pain  . Right Hip - Pain    HPI Mr. Brandon Acosta is a 78 year old male comes in today with low back pain that is been ongoing for 3 months but worse over the last month and now dissipating over the last few days after doing home exercise program.  Has had a history of some low back pain with an MRI back few years ago.  He states that his back pain went away with just exercises.  However he feels that some of the exercises he was taught in therapy and still has sheets for actually exacerbate his pain.  He has been doing some extension exercises have helped with the pain in the low back.  He is having some numbness down the right anterior thigh.   Also some right groin pain.  He is recently been hospitalized with UTI and urosepsis is in the intensive care unit for about 4 days.  All symptoms have resolved he is no longer on any antibiotics.  He describes a pinching-like sensation in the low back.  Michela Pitcher had a recent bowel bladder dysfunction.  He has no waking pain.  He denies any other joint pain.  Pain is worse whenever he first awakens in the morning gets better throughout the day.  Is been taking some Advil and this definitely helped with the low back pain.  Review of Systems  Constitutional: Negative for chills and fever.  Respiratory: Negative for shortness of breath.   Genitourinary: Negative for dysuria, enuresis and hematuria.  Musculoskeletal: Positive for back pain.     Objective: Vital Signs: There were no vitals taken for this visit.  Physical Exam General: Well-developed well-nourished male in no acute distress. Psych alert and oriented x3. Vascular: Dorsal pedal pulses are 2+ bilaterally and equal symmetric.  Calves are supple and nontender bilaterally. Ortho Exam Lumbar spine has good extension flexion of the lumbar spine without significant pain.  Lower extremities 5 out of 5 strength throughout.  Negative straight leg raise bilaterally.  Was able to come within 4 inches of touching his toes.  Deep tendon reflexes are 2+ at the knees and ankles symmetrical 1+ at the ankles and equal symmetric.  Sensation grossly intact bilateral feet to light touch.  Good range of motion bilateral hips without pain. Specialty Comments:  No specialty comments available.  Imaging: Xr Hip Unilat W Or W/o Pelvis 1v Right  Result Date: 10/07/2018 AP pelvis and lateral view of the right hip: Bilateral hips well preserved on the AP view.  Both hips are well located.  There is no bony abnormalities or cystic changes of either femoral head.  No acute fractures.  Xr Lumbar Spine 2-3 Views  Result Date: 10/07/2018 Lumbar spine 2 views:  Degenerative disc disease L5-S1.  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  Lower lumbar facet changes.  No acute fractures no other bony abnormalities.    PMFS History: Patient Active Problem List   Diagnosis Date Noted  . Acute lower UTI 09/04/2018  . 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 Hematuria 09/04/2018  . BPH (benign prostatic hyperplasia) 09/04/2018  . Sepsis secondary to UTI ([place]) 09/04/2018  . UTI (urinary tract infection) 09/04/2018  . Abnormal ECG during exercise stress test    Past Medical History:  Diagnosis Date  . Arthritis   . Diverticulitis   . GERD (gastroesophageal reflux disease)   . Hyperlipemia   . Osteoporosis     History reviewed. No pertinent family history.  Past Surgical History:  Procedure Laterality Date  . APPENDECTOMY  as child  . CARDIAC CATHETERIZATION N/A 10/16/2016   Procedure: Left Heart Cath and Coronary Angiography;  Surgeon: Jettie Booze, MD;  Location: [facility];  Service: Cardiovascular;  Laterality: N/A;  . COLONOSCOPY WITH PROPOFOL N/A 02/11/2014   Procedure: COLONOSCOPY WITH PROPOFOL;  Surgeon: Cleotis Nipper, MD;  Location: [facility];  Service: Endoscopy;  Laterality: N/A;  . KNEE ARTHROSCOPY Right 2013   torn meniscus repair   Social History   Occupational History  . Not on file  Tobacco Use  . Smoking status: Former Smoker    Packs/day: 0.50    Years: 20.00    Pack years: 10.00    Types: Cigarettes    Last attempt to quit: 09/24/1976    Years since quitting: 42.0  . Smokeless tobacco: Never Used  Substance and Sexual Activity  . Alcohol use: Yes    Comment: occassionally 1 glass wine per day  . Drug use: No  . Sexual activity: Not on file

## 2018-10-08 ENCOUNTER — Other Ambulatory Visit (INDEPENDENT_AMBULATORY_CARE_PROVIDER_SITE_OTHER): Payer: Self-pay | Admitting: Orthopaedic Surgery

## 2018-10-08 MED ORDER — TIZANIDINE HCL 4 MG PO TABS: 4.0000 mg | ORAL_TABLET | Freq: Three times a day (TID) | ORAL | 0 refills | Status: DC | PRN

## 2018-10-15 ENCOUNTER — Encounter: Payer: Self-pay | Admitting: Physical Therapy

## 2018-10-15 ENCOUNTER — Ambulatory Visit: Payer: Medicare Other | Attending: Orthopaedic Surgery | Admitting: Physical Therapy

## 2018-10-15 ENCOUNTER — Other Ambulatory Visit: Payer: Self-pay

## 2018-10-15 DIAGNOSIS — G8929 Other chronic pain: Secondary | ICD-10-CM | POA: Insufficient documentation

## 2018-10-15 DIAGNOSIS — M62830 Muscle spasm of back: Secondary | ICD-10-CM | POA: Diagnosis not present

## 2018-10-15 DIAGNOSIS — M5441 Lumbago with sciatica, right side: Secondary | ICD-10-CM | POA: Diagnosis not present

## 2018-10-15 DIAGNOSIS — M6281 Muscle weakness (generalized): Secondary | ICD-10-CM | POA: Diagnosis not present

## 2018-10-15 NOTE — Therapy (Signed)
[facility] [address]  [place], [place], [address] Phone: [phone]   Fax:  [phone]  Physical Therapy Evaluation  Patient Details  Name: Santi Troung MRN: [id number] Date of Birth: June 26, 1941 Referring Provider (PT): Jean Rosenthal, MD   Encounter Date: 10/15/2018  PT End of Session - 10/15/18 1007    Visit Number  1    Number of Visits  8    Date for PT Re-Evaluation  11/12/18    Authorization Type  Medicare & BCBS    PT Start Time  1007    PT Stop Time  1102    PT Time Calculation (min)  55 min    Activity Tolerance  Patient tolerated treatment well    Behavior During Therapy  [facility] for tasks assessed/performed       Past Medical History:  Diagnosis Date  . Arthritis   . Diverticulitis   . GERD (gastroesophageal reflux disease)   . Hyperlipemia   . Osteoporosis     Past Surgical History:  Procedure Laterality Date  . APPENDECTOMY  as child  . CARDIAC CATHETERIZATION N/A 10/16/2016   Procedure: Left Heart Cath and Coronary Angiography;  Surgeon: Jettie Booze, MD;  Location: [facility];  Service: Cardiovascular;  Laterality: N/A;  . COLONOSCOPY WITH PROPOFOL N/A 02/11/2014   Procedure: COLONOSCOPY WITH PROPOFOL;  Surgeon: Cleotis Nipper, MD;  Location: [facility];  Service: Endoscopy;  Laterality: N/A;  . KNEE ARTHROSCOPY Right 2013   torn meniscus repair    There were no vitals filed for this visit.   Subjective Assessment - 10/15/18 1011    Subjective  Pt reports 3 months h/o LBP triggered by leaning over deck railing for ~15 minutes after which back locked up on him. Most recently experiencing some severe "pinching" nerve pain around R hip and into R thigh. Pt reports pain improving more recently with changing exercises, taking Advil and muscle relaxant, and sleeping on floor with 2 inch foam mat. Also saw chiropractor last week.    Limitations  Sitting;Lifting    How long can you sit comfortably?  1 hour    Diagnostic tests  10/07/18 - Lumbar spine:  Degenerative disc disease L5-S1. 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 Lower lumbar facet changes. No acute fractures no other bony abnormalities.;  R hip x-ray: WNL    Patient Stated Goals  "would like a set of exercises that would help me improve to the point where I don't need surgery"    Currently in Pain?  No/denies    Pain Score  0-No pain    Pain Location  Back    Pain Orientation  Lower;Right    Pain Descriptors / Indicators  Cramping;Tightness    Pain Type  Acute pain    Pain Radiating Towards  "pinching" nerve pain around R hip into anterior thigh    Pain Onset  More than a month ago    Pain Frequency  Intermittent    Aggravating Factors   sitting, bending over    Pain Relieving Factors  extension exercise, OTC pain meds, muscle relaxants    Effect of Pain on Daily Activities  currently sleeping on the floor rather than bed; LB dressing; driving tolerance was limited but improving         [facility] PT Assessment - 10/15/18 1007      Assessment   Medical Diagnosis  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   No    Is the patient reluctant to leave their home because of a fear of falling?   No      Home Environment   Living Environment  Private residence    Type of [place] to enter    Entrance Stairs-Number of Steps  3    [place];Able to live on main level with bedroom/bathroom      Prior Function   Level of Independence  Independent    Vocation  Retired    Office manager, Haematologist, 2 mile walk every day       Observation/Other Assessments   Focus on Therapeutic Outcomes (FOTO)   Lumbar - 63% (37% limitation); Predicted 70% (30% limitation)      Posture/Postural Control   Posture/Postural Control  No significant limitations      ROM / Strength   AROM / PROM / Strength  AROM;Strength      AROM   AROM Assessment Site  Lumbar    Lumbar Flexion  hands to lower shins    Lumbar Extension  50%    Lumbar - Right Side Bend  hand to fibular head    Lumbar - Left Side Bend  hand to fibular head    Lumbar - Right Rotation  60%    Lumbar - Left Rotation  80%      Strength   Strength Assessment Site  Hip;Knee;Ankle    Right/Left Hip  Right;Left    Right Hip Flexion  4/5    Right Hip Extension  4/5    Right Hip External Rotation   4/5    Right Hip Internal Rotation  4+/5    Right Hip ABduction  4/5    Right Hip ADduction  4/5    Left Hip Flexion  4+/5    Left Hip Extension  4/5    Left Hip External Rotation  4+/5    Left Hip Internal Rotation  4+/5    Left Hip ABduction  4+/5    Left Hip ADduction  4+/5    Right/Left Knee  Right;Left    Right Knee Flexion  5/5    Right Knee Extension  5/5    Left Knee Flexion  5/5    Left Knee Extension  5/5    Right/Left Ankle  Right;Left    Right Ankle Dorsiflexion  5/5    Right Ankle Plantar Flexion  5/5    Left Ankle Dorsiflexion  5/5    Left Ankle Plantar Flexion  5/5      Flexibility   Soft Tissue Assessment /Muscle Length  yes    Hamstrings  mod tight R>L    Quadriceps  mod tight B    ITB  mild/mod tight R>L    Piriformis  mild/mod tight R>L                Objective measurements completed on examination: See above findings.      [facility] Adult PT Treatment/Exercise - 10/15/18  1007      Exercises   Exercises  Lumbar      Lumbar Exercises: Stretches   Passive Hamstring Stretch  Right;30 seconds;1 rep    Passive Hamstring Stretch Limitations  supine with strap    Prone on Elbows Stretch  30 seconds;1 rep    Press Ups  5 reps;5  seconds    Quad Stretch  Right;30 seconds;1 rep    Sports administrator Limitations  RF/hip flexor stretch with strap and hip extended with knee elevated on folded pillow    ITB Stretch  Right;30 seconds;1 rep    ITB Stretch Limitations  supine with strap    Figure 4 Stretch  30 seconds;1 rep;With overpressure;Supine    Figure 4 Stretch Limitations  B knees to chest             PT Education - 10/15/18 1100    Education Details  PT eval findings, anticipated POC & initial HEP    Person(s) Educated  Patient    Methods  Explanation;Demonstration;Handout    Comprehension  Verbalized understanding;Returned demonstration;Need further instruction          PT Long Term Goals - 10/15/18 1109      PT LONG TERM GOAL #1   Title  Independent with ongoing HEP    Status  New    Target Date  11/12/18      PT LONG TERM GOAL #2   Title  Patient to demonstrate appropriate posture and body mechanics needed for daily activities    Status  New    Target Date  11/12/18      PT LONG TERM GOAL #3   Title  Patient to improve lumbar AROM to [facility] without pain provocation     Status  New    Target Date  11/12/18      PT LONG TERM GOAL #4   Title  B hip strength >/= 4+/5 to 5/5 for improved stability    Status  New    Target Date  11/12/18      PT LONG TERM GOAL #5   Title  Patient to report ability to perform ADLs, household tasks and recreational activities without increased low back or radicular R LE pain    Status  New    Target Date  11/12/18             Plan - 10/15/18 1104    Clinical Impression Statement  Jonanthony is a 78 y/o male who presents to [facility] PT with an acute exacerbation of chronic LBP with R LE radiculopathy triggered ~3 months ago after leaning over deck rail for extended period with his back "locking up" on him when he stood back up. Pain progressed to include R LE radiculopathy consistent with L4-5 pattern which patient describes as a "pinching" nerve pain. Patient noting  some initial relief from self-modifications to his long-standing HEP from prior PT episode for low back ~12 yrs ago to include POE at his son's suggestion as well as taking Advil and muscle relaxants. Assessment revealed mild limitations in lumbar ROM, mostly due to tightness with mild to moderate limitation in flexibility also noted throughout proximal LE musculature. Mild proximal LE weakness also evident, R > L. Pain has caused him to start sleeping on the floor with a foam mat and initially limited sitting and driving tolerance, although pt already noting improvement with this. Pt also reporting he has not been able to play golf since the beginning of the current exacerbation.  Rayn will benefit from skilled PT intervention to address the above listed deficits and to allow for improved functional mobility and activity tolerance with decreased low back pain & LE radiculopathy.    History and Personal Factors relevant to plan of care:  2 prior exacerbations of LBP (most recent ~12 yrs ago); R knee arthroscopy for meniscal repair in 2013; recent hospitalization for sepsis secondary to UTI in 08/2018    Clinical Presentation  Stable    Clinical Decision Making  Low    Rehab Potential  Excellent    PT Frequency  2x / week    PT Duration  4 weeks    PT Treatment/Interventions  Patient/family education;Neuromuscular re-education;Therapeutic exercise;Therapeutic activities;Functional mobility training;Manual techniques;Passive range of motion;Dry needling;Taping;Spinal Manipulations;Electrical Stimulation;Moist Heat;Traction;Cryotherapy;Iontophoresis 4mg /ml Dexamethasone;ADLs/Self Care Home Management    PT Next Visit Plan  Review initial HEP; Lumbar & proximal LE flexibility and lumbopelvic/core strengthening - extension based program; Manual therapy & modalities as indicated    Consulted and Agree with Plan of Care  Patient       Patient will benefit from skilled therapeutic intervention in order to  improve the following deficits and impairments:  Pain, Increased muscle spasms, Impaired flexibility, [name], Decreased strength, Postural dysfunction, Improper body mechanics, Decreased activity tolerance  Visit Diagnosis: Chronic right-sided low back pain with right-sided sciatica - Plan: PT plan of care cert/re-cert  Muscle spasm of back - Plan: PT plan of care cert/re-cert  Muscle weakness (generalized) - Plan: PT plan of care cert/re-cert     Problem List Patient Active Problem List   Diagnosis Date Noted  . Acute lower UTI 09/04/2018  . 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 Hematuria 09/04/2018  . BPH (benign prostatic hyperplasia) 09/04/2018  . Sepsis secondary to UTI ([place]) 09/04/2018  . UTI (urinary tract infection) 09/04/2018  . Abnormal ECG during exercise stress test     Percival Spanish, PT, MPT  10/15/2018, 1:37 PM  [facility] [address]  [place], [place], [address] Phone: [phone]   Fax:  [phone]  Name: Markez Dowland MRN: [id number] Date of Birth: [birth year]

## 2018-10-21 ENCOUNTER — Ambulatory Visit: Payer: Medicare Other

## 2018-10-21 DIAGNOSIS — G8929 Other chronic pain: Secondary | ICD-10-CM | POA: Diagnosis not present

## 2018-10-21 DIAGNOSIS — M6281 Muscle weakness (generalized): Secondary | ICD-10-CM

## 2018-10-21 DIAGNOSIS — M62830 Muscle spasm of back: Secondary | ICD-10-CM | POA: Diagnosis not present

## 2018-10-21 DIAGNOSIS — M5441 Lumbago with sciatica, right side: Principal | ICD-10-CM

## 2018-10-21 NOTE — Therapy (Signed)
[facility] [address]  [place], [place], [address] Phone: [phone]   Fax:  [phone]  Physical Therapy Treatment  Patient Details  Name: Brandon Acosta MRN: [id number] Date of Birth: 1940/10/13 Referring Provider (PT): Jean Rosenthal, MD   Encounter Date: 10/21/2018  PT End of Session - 10/21/18 0938    Visit Number  2    Number of Visits  8    Date for PT Re-Evaluation  11/12/18    Authorization Type  Medicare & BCBS    PT Start Time  [id number]    PT Stop Time  1013    PT Time Calculation (min)  42 min    Activity Tolerance  Patient tolerated treatment well    Behavior During Therapy  [facility] for tasks assessed/performed       Past Medical History:  Diagnosis Date  . Arthritis   . Diverticulitis   . GERD (gastroesophageal reflux disease)   . Hyperlipemia   . Osteoporosis     Past Surgical History:  Procedure Laterality Date  . APPENDECTOMY  as child  . CARDIAC CATHETERIZATION N/A 10/16/2016   Procedure: Left Heart Cath and Coronary Angiography;  Surgeon: Jettie Booze, MD;  Location: [facility];  Service: Cardiovascular;  Laterality: N/A;  . COLONOSCOPY WITH PROPOFOL N/A 02/11/2014   Procedure: COLONOSCOPY WITH PROPOFOL;  Surgeon: Cleotis Nipper, MD;  Location: [facility];  Service: Endoscopy;  Laterality: N/A;  . KNEE ARTHROSCOPY Right 2013   torn meniscus repair    There were no vitals filed for this visit.  Subjective Assessment - 10/21/18 0933    Subjective  Pt. noting he is not having "pinching" pain down R leg for the last three days.     Diagnostic tests  10/07/18 - Lumbar spine:  Degenerative disc disease L5-S1. 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 Lower lumbar facet changes. No acute fractures no other bony abnormalities.;  R hip x-ray: WNL    Patient Stated Goals  "would like a set of exercises that would help me improve to the point where I don't need  surgery"    Currently in Pain?  Yes    Pain Score  3    up to a 5/10 pain at worst   Pain Location  Back    Pain Orientation  Lower;Left    Pain Descriptors / Indicators  Sore    Pain Type  Acute pain    Pain Radiating Towards  denies today    Pain Onset  More than a month ago    Pain Frequency  Intermittent    Multiple Pain Sites  No                       [facility] Adult PT Treatment/Exercise - 10/21/18 0947      Lumbar Exercises: Stretches   Passive Hamstring Stretch  Right;30 seconds;1 rep   Cues to maintain straight LE    Passive Hamstring Stretch Limitations  supine with strap    Lumbar Stabilization Level 1  3 reps;30 seconds    Lumbar Stabilization Level 1 Limitations  prone 2 to R 1 rep to middle     Prone on Elbows Stretch  30 seconds;1 rep    Prone on Elbows Stretch Limitations  with protraction press up     Press Ups  5 seconds;10 reps    Press Ups Limitations  partial ROM     ITB Stretch  Right;30 seconds;2 reps    ITB Stretch Limitations  supine with strap    Figure 4 Stretch  1 rep;30 seconds;Supine    Figure 4 Stretch Limitations  B knees to chest      Lumbar Exercises: Aerobic   Recumbent Bike  Lvl 2, 6 min       Lumbar Exercises: Supine   Clam  15 reps;3 seconds    Clam Limitations  red TB at knees     Bridge with clamshell  15 reps;3 seconds    Bridge with [employer] Limitations  red looped TB at knees with isoemtric hip abd/ER       Lumbar Exercises: Quadruped   Opposite Arm/Leg Raise  10 reps;Right arm/Left leg;Left arm/Right leg;3 seconds                  PT Long Term Goals - 10/21/18 0939      PT LONG TERM GOAL #1   Title  Independent with ongoing HEP    Status  On-going      PT LONG TERM GOAL #2   Title  Patient to demonstrate appropriate posture and body mechanics needed for daily activities    Status  On-going      PT LONG TERM GOAL #3   Title  Patient to improve lumbar AROM to [facility] without pain provocation      Status  On-going      PT LONG TERM GOAL #4   Title  B hip strength >/= 4+/5 to 5/5 for improved stability    Status  On-going      PT LONG TERM GOAL #5   Title  Patient to report ability to perform ADLs, household tasks and recreational activities without increased low back or radicular R LE pain    Status  On-going            Plan - 10/21/18 0939    Clinical Impression Statement  Pt. reporting 3-4 days relief from radicular pain and attributes this to HEP demonstrating progress toward LTG#5.  Able to demo good overall technique with HEP review today only requiring min cueing for positioning with ITB stretch for appropriate stretch sensation.  Tolerated addition of bridging and quadruped LE/UE raise well.  Will continue to progress toward goals.      Rehab Potential  Excellent    PT Frequency  2x / week    PT Duration  4 weeks    PT Treatment/Interventions  Patient/family education;Neuromuscular re-education;Therapeutic exercise;Therapeutic activities;Functional mobility training;Manual techniques;Passive range of motion;Dry needling;Taping;Spinal Manipulations;Electrical Stimulation;Moist Heat;Traction;Cryotherapy;Iontophoresis 4mg /ml Dexamethasone;ADLs/Self Care Home Management    PT Next Visit Plan  Lumbar & proximal LE flexibility and lumbopelvic/core strengthening - extension based program; Manual therapy & modalities as indicated    Consulted and Agree with Plan of Care  Patient       [name], Increased muscle spasms, Impaired flexibility, [name], Decreased strength, Postural dysfunction, Improper body mechanics, Decreased activity tolerance  Visit Diagnosis: Chronic right-sided low back pain with right-sided sciatica  Muscle spasm of back  Muscle weakness (generalized)     Problem List Patient Active Problem List   Diagnosis Date  Noted  . Acute lower UTI 09/04/2018  . 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 Hematuria 09/04/2018  . BPH (benign prostatic hyperplasia) 09/04/2018  . Sepsis secondary to UTI ([place]) 09/04/2018  . UTI (urinary tract infection) 09/04/2018  .  Abnormal ECG during exercise stress test     Bess Harvest, PTA 10/21/18 12:57 PM   [facility] [address]  [place], [place], [address] Phone: [phone]   Fax:  [phone]  Name: Brandon Acosta MRN: [id number] Date of Birth: [birth year]

## 2018-10-24 ENCOUNTER — Ambulatory Visit: Payer: Medicare Other

## 2018-10-24 DIAGNOSIS — M6281 Muscle weakness (generalized): Secondary | ICD-10-CM

## 2018-10-24 DIAGNOSIS — G8929 Other chronic pain: Secondary | ICD-10-CM

## 2018-10-24 DIAGNOSIS — M62830 Muscle spasm of back: Secondary | ICD-10-CM | POA: Diagnosis not present

## 2018-10-24 DIAGNOSIS — M5441 Lumbago with sciatica, right side: Principal | ICD-10-CM

## 2018-10-24 NOTE — Patient Instructions (Addendum)

## 2018-10-24 NOTE — Therapy (Signed)
[facility] [address]  [place], [place], [address] Phone: [phone]   Fax:  [phone]  Physical Therapy Treatment  Patient Details  Name: Brandon Acosta MRN: [id number] Date of Birth: Feb 15, 1941 Referring Provider (PT): Jean Rosenthal, MD   Encounter Date: 10/24/2018  PT End of Session - 10/24/18 0935    Visit Number  3    Number of Visits  8    Date for PT Re-Evaluation  11/12/18    Authorization Type  Medicare & BCBS    PT Start Time  [phone]    PT Stop Time  1022   ended visit with 10 min moist heat    PT Time Calculation (min)  54 min    Activity Tolerance  Patient tolerated treatment well    Behavior During Therapy  [facility] for tasks assessed/performed       Past Medical History:  Diagnosis Date  . Arthritis   . Diverticulitis   . GERD (gastroesophageal reflux disease)   . Hyperlipemia   . Osteoporosis     Past Surgical History:  Procedure Laterality Date  . APPENDECTOMY  as child  . CARDIAC CATHETERIZATION N/A 10/16/2016   Procedure: Left Heart Cath and Coronary Angiography;  Surgeon: Jettie Booze, MD;  Location: [place] [facility];  Service: Cardiovascular;  Laterality: N/A;  . COLONOSCOPY WITH PROPOFOL N/A 02/11/2014   Procedure: COLONOSCOPY WITH PROPOFOL;  Surgeon: Cleotis Nipper, MD;  Location: [facility];  Service: Endoscopy;  Laterality: N/A;  . KNEE ARTHROSCOPY Right 2013   torn meniscus repair    There were no vitals filed for this visit.  Subjective Assessment - 10/24/18 0931    Subjective  Pt. reporting some soreness and tightness after last session x 1 day.      Diagnostic tests  10/07/18 - Lumbar spine:  Degenerative disc disease L5-S1. 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 Lower lumbar facet changes. No acute fractures no other bony abnormalities.;  R hip x-ray: WNL    Patient Stated Goals  "would like a set of exercises that would help me improve to the  point where I don't need surgery"    Currently in Pain?  No/denies    Pain Score  0-No pain   Pain at worst up to 4/10 and denies radicular pain    Pain Location  Back    Pain Orientation  Lower;Left    Pain Descriptors / Indicators  Sore    Pain Type  Acute pain    Pain Radiating Towards  denies today and none since last session     Pain Onset  More than a month ago    Pain Frequency  Intermittent    Aggravating Factors   Pushing to far with prone press up    Multiple Pain Sites  No                       [facility] Adult PT Treatment/Exercise - 10/24/18 0946      Self-Care   Self-Care  Other Self-Care Comments    Other Self-Care Comments   REviewed posture and body mechanics handout covering proper posture and body mechanics with daily activities as to reduce lumbar strain; focused on standing in kitchen with LE resting on lower counter shelf as to avoid excessive lumbar strain with prolonged standing       Lumbar Exercises: Stretches   Passive Hamstring Stretch  Right;30 seconds;1 rep  Passive Hamstring Stretch Limitations  supine with strap    Piriformis Stretch  Right;2 reps;30 seconds    Piriformis Stretch Limitations  KTOS      Lumbar Exercises: Aerobic   Recumbent Bike  Lvl 2, 6 min       Lumbar Exercises: Standing   Functional Squats  10 reps;3 seconds    Functional Squats Limitations  at TM rail with pt. noting he performs this activity at home sometimes     Row  Both;10 reps    Theraband Level (Row)  Level 2 (Red)    Row Limitations  Cues for scapular retraction     Shoulder Extension  Both;10 reps    Theraband Level (Shoulder Extension)  Level 2 (Red)    Shoulder Extension Limitations  seated on green p-ball       Lumbar Exercises: Supine   Bridge with Ball Squeeze  10 reps;3 seconds    Bridge with [employer] Limitations  encouraged pt. to use "pillow squeeze" at home     Bridge with clamshell  15 reps;3 seconds    Bridge with [employer]  Limitations  green looped TB at knees with isoemtric hip abd/ER       Lumbar Exercises: Sidelying   Clam  Right;Left;10 reps;3 seconds    Clam Limitations  red looped TB at knees    Hip Abduction  Right;10 reps;3 seconds    Other Sidelying Lumbar Exercises  R hip adduction 3" x 10 reps       Modalities   Modalities  Moist Heat      Moist Heat Therapy   Number Minutes Moist Heat  10 Minutes    Moist Heat Location  Lumbar Spine   and mid back            PT Education - 10/24/18 1044    Education Details  HEP update; bridge + adduction pillow squeeze, SLR    Person(s) Educated  Patient    Methods  Explanation;Demonstration;Verbal cues;Handout    Comprehension  Verbalized understanding;Returned demonstration;Verbal cues required;Need further instruction          PT Long Term Goals - 10/24/18 1046      PT LONG TERM GOAL #1   Title  Independent with ongoing HEP    Status  Partially Met      PT LONG TERM GOAL #2   Title  Patient to demonstrate appropriate posture and body mechanics needed for daily activities    Status  On-going      PT LONG TERM GOAL #3   Title  Patient to improve lumbar AROM to [facility] without pain provocation     Status  On-going      PT LONG TERM GOAL #4   Title  B hip strength >/= 4+/5 to 5/5 for improved stability    Status  On-going      PT LONG TERM GOAL #5   Title  Patient to report ability to perform ADLs, household tasks and recreational activities without increased low back or radicular R LE pain    Status  On-going            Plan - 10/24/18 0954    Clinical Impression Statement  Pt. reporting some LE soreness and increased LBP for remainder of day after last session and into Wednesday morning. Reports no radicular symptoms since before last session noting LBP concentrated over mid lumbar area recently.  Pt. noting Thursday he was pain free and had an "unusually good  day".  Tolerated proximal hip strengthening activities well today  without increased pain.  Verbalized that he has been having some increased LBP with prolonged standing in kitchen preparing meals.  Reviewed proper posture and body mechanics with daily activities with handout issued to pt. today and pt. verbalizing understanding.  Ended visit with moist heat to lumbar spine to promote reduction in muscular tension as pt. noting benefit from this at home.  Pt. progressing well toward LTG's.      Rehab Potential  Excellent    PT Frequency  2x / week    PT Duration  4 weeks    PT Treatment/Interventions  Patient/family education;Neuromuscular re-education;Therapeutic exercise;Therapeutic activities;Functional mobility training;Manual techniques;Passive range of motion;Dry needling;Taping;Spinal Manipulations;Electrical Stimulation;Moist Heat;Traction;Cryotherapy;Iontophoresis 102m/ml Dexamethasone;ADLs/Self Care Home Management    PT Next Visit Plan  Lumbar & proximal LE flexibility and lumbopelvic/core strengthening - extension based program; Manual therapy & modalities as indicated    Consulted and Agree with Plan of Care  Patient       [name], Increased muscle spasms, Impaired flexibility, [name], Decreased strength, Postural dysfunction, Improper body mechanics, Decreased activity tolerance  Visit Diagnosis: Chronic right-sided low back pain with right-sided sciatica  Muscle spasm of back  Muscle weakness (generalized)     Problem List Patient Active Problem List   Diagnosis Date Noted  . Acute lower UTI 09/04/2018  . 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 Hematuria 09/04/2018  . BPH (benign prostatic hyperplasia) 09/04/2018  . Sepsis [place] to [place] 09/04/2018  . UTI (urinary tract infection) 09/04/2018  . Abnormal ECG during exercise stress test     MBess Harvest PTA 10/24/18 10:55 AM   [facility] [place] [address]Phone: [phone]  Fax:  [phone] Name: WMearl OlverMRN: [id number] of Birth: [birth year]

## 2018-10-28 ENCOUNTER — Ambulatory Visit: Payer: Medicare Other | Attending: Orthopaedic Surgery

## 2018-10-28 DIAGNOSIS — M6281 Muscle weakness (generalized): Secondary | ICD-10-CM

## 2018-10-28 DIAGNOSIS — M5441 Lumbago with sciatica, right side: Secondary | ICD-10-CM | POA: Diagnosis not present

## 2018-10-28 DIAGNOSIS — G8929 Other chronic pain: Secondary | ICD-10-CM | POA: Insufficient documentation

## 2018-10-28 DIAGNOSIS — M62830 Muscle spasm of back: Secondary | ICD-10-CM | POA: Insufficient documentation

## 2018-10-28 NOTE — Therapy (Signed)
[facility] [address]  [place], [place], [address] Phone: [phone]   Fax:  [phone]  Physical Therapy Treatment  Patient Details  Name: Brandon Acosta MRN: [id number] Date of Birth: 1941/01/04 Referring Provider (PT): Jean Rosenthal, MD   Encounter Date: 10/28/2018  PT End of Session - 10/28/18 0939    Visit Number  4    Number of Visits  8    Date for PT Re-Evaluation  11/12/18    Authorization Type  Medicare & BCBS    PT Start Time  0932    PT Stop Time  1014    PT Time Calculation (min)  42 min    Activity Tolerance  Patient tolerated treatment well    Behavior During Therapy  [facility] for tasks assessed/performed       Past Medical History:  Diagnosis Date  . Arthritis   . Diverticulitis   . GERD (gastroesophageal reflux disease)   . Hyperlipemia   . Osteoporosis     Past Surgical History:  Procedure Laterality Date  . APPENDECTOMY  as child  . CARDIAC CATHETERIZATION N/A 10/16/2016   Procedure: Left Heart Cath and Coronary Angiography;  Surgeon: Jettie Booze, MD;  Location: [place] [facility];  Service: Cardiovascular;  Laterality: N/A;  . COLONOSCOPY WITH PROPOFOL N/A 02/11/2014   Procedure: COLONOSCOPY WITH PROPOFOL;  Surgeon: Cleotis Nipper, MD;  [facility];  Service: Endoscopy;  Laterality: N/A;  . KNEE ARTHROSCOPY Right 2013   torn meniscus repair    There were no vitals filed for this visit.  Subjective Assessment - 10/28/18 0938    Subjective  Pt. 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  Pt. reporting he adjusted body mechanics     Diagnostic tests  10/07/18 - Lumbar spine:  Degenerative disc disease L5-S1. 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 Lower lumbar facet changes. No acute fractures no other bony abnormalities.;  R hip x-ray: WNL    Patient  Stated Goals  "would like a set of exercises that would help me improve to the point where I don't need surgery"    Currently in Pain?  Yes    Pain Score  1     Pain Location  Back    Pain Orientation  Lower;Left    Pain Descriptors / Indicators  Sore    Pain Type  Acute pain    Pain Onset  More than a month ago    Pain Frequency  Intermittent    Multiple Pain Sites  No                       [facility] Adult PT Treatment/Exercise - 10/28/18 0949      Lumbar Exercises: Stretches   Lower Trunk Rotation  --   5" x 10 reps   ITB Stretch  Right;Left;1 rep;30 seconds    ITB Stretch Limitations  supine with strap     Piriformis Stretch  Right;2 reps;30 seconds    Piriformis Stretch Limitations  KTOS      Lumbar Exercises: Aerobic   Recumbent Bike  Lvl 2, 7 min       Lumbar Exercises: Seated   Hip Flexion on Ball  Right;Left;10 reps    Other Seated Lumbar Exercises  B pallof press with red TB seated with narrows stance on green p-ball x 10 reps  Lumbar Exercises: Sidelying   Clam  Right;Left;3 seconds   x 12 reps    Clam Limitations  red looped TB at knees    Other Sidelying Lumbar Exercises  B sidelying "open book" stretch 5" x 10 reps       Lumbar Exercises: Prone   Single Arm Raise  10 reps;3 seconds    Single Arm Raises Limitations  Alternating UE raise pillow under hips    Straight Leg Raise  10 reps;3 seconds    Straight Leg Raises Limitations  pillow under                   PT Long Term Goals - 10/24/18 1046      PT LONG TERM GOAL #1   Title  Independent with ongoing HEP    Status  Partially Met      PT LONG TERM GOAL #2   Title  Patient to demonstrate appropriate posture and body mechanics needed for daily activities    Status  On-going      PT LONG TERM GOAL #3   Title  Patient to improve lumbar AROM to [facility] without pain provocation     Status  On-going      PT LONG TERM GOAL #4   Title  B hip strength >/= 4+/5 to 5/5 for improved  stability    Status  On-going      PT LONG TERM GOAL #5   Title  Patient to report ability to perform ADLs, household tasks and recreational activities without increased low back or radicular R LE pain    Status  On-going            Plan - 10/28/18 0944    Clinical Impression Statement  Pt. noting increased lower back "stiffness" and short-lasting R LE pain this morning which subsided.  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  Tolerated progression of lumbopelvic strengthening and stability activities well today with addition of seated p-ball activities.  Reports altering body mechanics with kitchen work which reduced his LBP pain with this task.  Pt. progressing toward LTG #2.  Ended visit pain free.      Rehab Potential  Excellent    PT Treatment/Interventions  Patient/family education;Neuromuscular re-education;Therapeutic exercise;Therapeutic activities;Functional mobility training;Manual techniques;Passive range of motion;Dry needling;Taping;Spinal Manipulations;Electrical Stimulation;Moist Heat;Traction;Cryotherapy;Iontophoresis 58m/ml Dexamethasone;ADLs/Self Care Home Management    PT Next Visit Plan  Lumbar & proximal LE flexibility and lumbopelvic/core strengthening - extension based program; Manual therapy & modalities as indicated    Consulted and Agree with Plan of Care  Patient       [name], Increased muscle spasms, Impaired flexibility, [name], Decreased strength, Postural dysfunction, Improper body mechanics, Decreased activity tolerance  Visit Diagnosis: Chronic right-sided low back pain with right-sided sciatica  Muscle spasm of back  Muscle weakness (generalized)     Problem List Patient Active Problem List   Diagnosis Date Noted  . Acute lower UTI  09/04/2018  . 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 Hematuria 09/04/2018  . BPH (benign prostatic hyperplasia) 09/04/2018  . Sepsis secondary to UTI ([place] 09/04/2018  . UTI (urinary tract infection) 09/04/2018  . Abnormal ECG during exercise stress test     MBess Harvest PTA 10/28/18 12:07 PM   [facility] [address]  [place], [place], [address] Phone: [phone]   Fax:  [phone]  Name: Brandon Acosta MRN: [id number] Date of Birth: Aug 03, 1941

## 2018-10-31 ENCOUNTER — Ambulatory Visit: Payer: Medicare Other

## 2018-10-31 DIAGNOSIS — M62830 Muscle spasm of back: Secondary | ICD-10-CM | POA: Diagnosis not present

## 2018-10-31 DIAGNOSIS — M6281 Muscle weakness (generalized): Secondary | ICD-10-CM

## 2018-10-31 DIAGNOSIS — M5441 Lumbago with sciatica, right side: Secondary | ICD-10-CM | POA: Diagnosis not present

## 2018-10-31 DIAGNOSIS — G8929 Other chronic pain: Secondary | ICD-10-CM

## 2018-10-31 NOTE — Therapy (Signed)
[facility] [address]  [place], [place], [address] Phone: [phone]   Fax:  [phone]  Physical Therapy Treatment  Patient Details  Name: Brandon Acosta MRN: [id number] Date of Birth: January 03, 1941 Referring Provider (PT): Jean Rosenthal, MD   Encounter Date: 10/31/2018  PT End of Session - 10/31/18 0935    Visit Number  5    Number of Visits  8    Date for PT Re-Evaluation  11/12/18    Authorization Type  Medicare & BCBS    PT Start Time  0929    PT Stop Time  1025   Ended visit with 10 min moist heat   PT Time Calculation (min)  56 min    Activity Tolerance  Patient tolerated treatment well    Behavior During Therapy  [facility] for tasks assessed/performed       Past Medical History:  Diagnosis Date  . Arthritis   . Diverticulitis   . GERD (gastroesophageal reflux disease)   . Hyperlipemia   . Osteoporosis     Past Surgical History:  Procedure Laterality Date  . APPENDECTOMY  as child  . CARDIAC CATHETERIZATION N/A 10/16/2016   Procedure: Left Heart Cath and Coronary Angiography;  Surgeon: Jettie Booze, MD;  Location: [place] [facility];  Service: Cardiovascular;  Laterality: N/A;  . COLONOSCOPY WITH PROPOFOL N/A 02/11/2014   Procedure: COLONOSCOPY WITH PROPOFOL;  Surgeon: Cleotis Nipper, MD;  [facility];  Service: Endoscopy;  Laterality: N/A;  . KNEE ARTHROSCOPY Right 2013   torn meniscus repair    There were no vitals filed for this visit.  Subjective Assessment - 10/31/18 0933    Diagnostic tests  10/07/18 - Lumbar spine:  Degenerative disc disease L5-S1. 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 Lower lumbar facet changes. No acute fractures no other bony abnormalities.;  R hip x-ray: WNL    Patient Stated Goals  "would like a set of exercises that would help me improve to the point where I don't need surgery"    Currently in Pain?  No/denies    Pain Score  0-No  pain   up to 1/10 in mornings    Multiple Pain Sites  No                       [facility] Adult PT Treatment/Exercise - 10/31/18 0944      Lumbar Exercises: Stretches   Passive Hamstring Stretch  Right;30 seconds;1 rep    Passive Hamstring Stretch Limitations  Manual with therapist    Single Knee to Chest Stretch  Right;1 rep;30 seconds    Single Knee to Chest Stretch Limitations  manual with therapist    ITB Stretch  Right;1 rep;30 seconds    ITB Stretch Limitations  manual with therapist     Piriformis Stretch  Right;1 rep;30 seconds    Piriformis Stretch Limitations  manual KTOS with therapist       Lumbar Exercises: Aerobic   Tread Mill  Lvl 3.0, 6 min       Lumbar Exercises: Seated   Long Arc Quad on [place]  Right;Left;10 reps    LAQ on [place] Limitations  seated on green p-ball     Hip Flexion on Ball  Right;Left;15 reps;Strengthening    Hip Flexion on Ball Limitations  seated on green p-ball     Other Seated Lumbar Exercises  B double red TB single arm row seated on green p-ball  x 10 reps each side     Other Seated Lumbar Exercises  B shoulder extension red band seated on green p-ball x 10 reps       Lumbar Exercises: Sidelying   Other Sidelying Lumbar Exercises  B sidelying "open book" stretch 5" x 10 reps       Lumbar Exercises: Quadruped   [place]  10 reps    [place]/Old Horse Limitations  Cues to avoid painful arc of movement     Other Quadruped Lumbar Exercises  Quadruped "thread the needle" x 10 reps       Knee/Hip Exercises: Standing   Hip Flexion  Right;Left;10 reps;Knee straight;Stengthening    Hip Flexion Limitations  yellow TB at ankle; 2 ski poles     Hip ADduction  Right;Left;10 reps;Strengthening    Hip ADduction Limitations  yellow TB at ankle; 2 ski poles     Hip Abduction  Right;Left;10 reps;Knee straight;Stengthening    Abduction Limitations  yellow TB at anlke; 2 ski poles    Hip Extension  Right;Left;10 reps;Knee  straight;Stengthening    Extension Limitations  yellow TB at ankle; 2 ski poles              PT Education - 10/31/18 1026    Education Details  HEP update; red TB issued to pt. for row, sdielying open book stretch, thread the needle     Person(s) Educated  Patient    Methods  Explanation;Demonstration;Verbal cues;Handout    Comprehension  Verbalized understanding;Returned demonstration;Verbal cues required;Need further instruction          PT Long Term Goals - 10/24/18 1046      PT LONG TERM GOAL #1   Title  Independent with ongoing HEP    Status  Partially Met      PT LONG TERM GOAL #2   Title  Patient to demonstrate appropriate posture and body mechanics needed for daily activities    Status  On-going      PT LONG TERM GOAL #3   Title  Patient to improve lumbar AROM to [facility] without pain provocation     Status  On-going      PT LONG TERM GOAL #4   Title  B hip strength >/= 4+/5 to 5/5 for improved stability    Status  On-going      PT LONG TERM GOAL #5   Title  Patient to report ability to perform ADLs, household tasks and recreational activities without increased low back or radicular R LE pain    Status  On-going            Plan - 10/31/18 1001    Clinical Impression Statement  Pt. reporting he has had some of his best days in regards to pain since last session.  Pt. reports he feels he is tolerating progression of strengthening activities well in therapy.  Session today with further progression of lumbopelvic stability activities seated on p-ball and initiated standing proximal hip strengthening with 4-way hip kicker.  Reported mild LBP to end session thus applied moist heat to lumbar/thoracic spine with good relief noted following this.  Pt. noting ~ 95% improvement in LE "nerve" pain and ~ 80% overall improvement in LBP since starting therapy.  Has not yet returned to golf due to poor weather however notes he plans to attempt golfing soon.  Does feel he has  improved his awareness of posture and body mechanics with daily tasks which has improved his tolerance for prolonged standing  in kitchen.  Progressing well toward LTG #2, #5.      Rehab Potential  Excellent    PT Treatment/Interventions  Patient/family education;Neuromuscular re-education;Therapeutic exercise;Therapeutic activities;Functional mobility training;Manual techniques;Passive range of motion;Dry needling;Taping;Spinal Manipulations;Electrical Stimulation;Moist Heat;Traction;Cryotherapy;Iontophoresis 82m/ml Dexamethasone;ADLs/Self Care Home Management    PT Next Visit Plan  Lumbar & proximal LE flexibility and lumbopelvic/core strengthening - extension based program; Manual therapy & modalities as indicated    Consulted and Agree with Plan of Care  Patient       [name], Increased muscle spasms, Impaired flexibility, [name], Decreased strength, Postural dysfunction, Improper body mechanics, Decreased activity tolerance  Visit Diagnosis: Chronic right-sided low back pain with right-sided sciatica  Muscle spasm of back  Muscle weakness (generalized)     Problem List Patient Active Problem List   Diagnosis Date Noted  . Acute lower UTI 09/04/2018  . 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 Hematuria 09/04/2018  . BPH (benign prostatic hyperplasia) 09/04/2018  . Sepsis secondary to UTI ([place] 09/04/2018  . UTI (urinary tract infection) 09/04/2018  . Abnormal ECG during exercise stress test     MBess Harvest PTA 10/31/18 10:38 AM    [facility] [place] [address]Phone: [phone]  Fax:  [phone] Name: Brandon StayMRN: [id number] of Birth: [birth year]

## 2018-11-04 ENCOUNTER — Ambulatory Visit: Payer: Medicare Other | Admitting: Physical Therapy

## 2018-11-04 ENCOUNTER — Encounter: Payer: Self-pay | Admitting: Physical Therapy

## 2018-11-04 DIAGNOSIS — M6281 Muscle weakness (generalized): Secondary | ICD-10-CM | POA: Diagnosis not present

## 2018-11-04 DIAGNOSIS — M62830 Muscle spasm of back: Secondary | ICD-10-CM

## 2018-11-04 DIAGNOSIS — M5441 Lumbago with sciatica, right side: Secondary | ICD-10-CM | POA: Diagnosis not present

## 2018-11-04 DIAGNOSIS — G8929 Other chronic pain: Secondary | ICD-10-CM | POA: Diagnosis not present

## 2018-11-04 NOTE — Therapy (Signed)
[facility] [address]  [place], [place], [address] Phone: [phone]   Fax:  [phone]  Physical Therapy Treatment  Patient Details  Name: Brandon Acosta MRN: [id number] Date of Birth: [birth year] Referring Provider (PT): Jean Rosenthal, MD   Encounter Date: 11/04/2018  PT End of Session - 11/04/18 0930    Visit Number  6    Number of Visits  8    Date for PT Re-Evaluation  11/12/18    Authorization Type  Medicare & BCBS    PT Start Time  0930    PT Stop Time  1015    PT Time Calculation (min)  45 min    Activity Tolerance  Patient tolerated treatment well    Behavior During Therapy  [facility] for tasks assessed/performed       Past Medical History:  Diagnosis Date  . Arthritis   . Diverticulitis   . GERD (gastroesophageal reflux disease)   . Hyperlipemia   . Osteoporosis     Past Surgical History:  Procedure Laterality Date  . APPENDECTOMY  as child  . CARDIAC CATHETERIZATION N/A 10/16/2016   Procedure: Left Heart Cath and Coronary Angiography;  Surgeon: Jettie Booze, MD;  Location: [facility];  Service: Cardiovascular;  Laterality: N/A;  . COLONOSCOPY WITH PROPOFOL N/A 02/11/2014   Procedure: COLONOSCOPY WITH PROPOFOL;  Surgeon: Cleotis Nipper, MD;  Location: [facility];  Service: Endoscopy;  Laterality: N/A;  . KNEE ARTHROSCOPY Right 2013   torn meniscus repair    There were no vitals filed for this visit.  Subjective Assessment - 11/04/18 0932    Subjective  Pt reports he had a good weekend but noted "pinching" in R buttock yesterday, less today - states walking around helps. States he tried swinging a golf club in his backyard and [place] - no pain noted.    Diagnostic tests  10/07/18 - Lumbar spine:  Degenerative disc disease L5-S1. 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 Lower lumbar facet changes. No acute fractures no other bony abnormalities.;  R  hip x-ray: WNL    Patient Stated Goals  "would like a set of exercises that would help me improve to the point where I don't need surgery"    Currently in Pain?  Yes    Pain Score  1    <1/10   Pain Location  Buttocks    Pain Orientation  Right    Pain Descriptors / Indicators  --   "pinching"   Pain Type  Acute pain    Pain Frequency  Intermittent                       [facility] Adult PT Treatment/Exercise - 11/04/18 0930      Lumbar Exercises: Stretches   Figure 4 Stretch  30 seconds;1 rep;With overpressure;Supine    Figure 4 Stretch Limitations  single leg to chest      Lumbar Exercises: Aerobic   Tread Mill  3.0 mph x 6 min      Lumbar Exercises: Standing   Forward Lunge  10 reps;3 seconds    Forward Lunge Limitations  single UE support Acosta counter    Wall Slides  10 reps;3 seconds    Wall Slides Limitations  Acosta orange Pball    Other Standing Lumbar Exercises  B pallof press with double red TB x 10    Other Standing Lumbar Exercises  Simulated golf swing with yellow TB x 10      Manual Therapy   Manual Therapy  Soft tissue mobilization;Myofascial release;Other (comment)    Manual therapy comments  L sidelying & prone    Soft tissue mobilization  R glutes & prirformis, B lumbar paraspinals (R>L)    Myofascial Release  manual TPR to R piriformis    Other Manual Therapy  Instructed pt in self-STM/release to glutes/piriformis with small ball Acosta chair or wall             PT Education - 11/04/18 1014    Education Details  HEP update - pallof press & lunges    Person(s) Educated  Patient    Methods  Explanation;Demonstration;Handout    Comprehension  Verbalized understanding;Returned demonstration          PT Long Term Goals - 11/04/18 0934      PT LONG TERM GOAL #1   Title  Independent with ongoing HEP    Status  Partially Met      PT LONG TERM GOAL #2   Title  Patient to demonstrate appropriate posture and body mechanics needed for daily  activities    Status  Achieved      PT LONG TERM GOAL #3   Title  Patient to improve lumbar AROM to [facility] without pain provocation     Status  Acosta-going      PT LONG TERM GOAL #4   Title  B hip strength >/= 4+/5 to 5/5 for improved stability    Status  Acosta-going      PT LONG TERM GOAL #5   Title  Patient to report ability to perform ADLs, household tasks and recreational activities without increased low back or radicular R LE pain    Status  Partially Met            Plan - 11/04/18 0935    Clinical Impression Statement  Brandon Acosta very pleased with his progress with PT thus far, noting resolution of "nervy" radicular pain and only very mild R low back/buttock "awareness" (discomfort) at <1/10 remaining. Mild increased tension noted in R glutes & piriformis as well as R sided paraspinals which improved with manual STM/MFR - instructed pt in self-STM using small ball Acosta wall or chair to address this at home. Strengthening today focusing Acosta unliateral and rotational stability including simulation of golf swing with good tolerance and no increased pain. Pt Acosta track to transition to HEP at end of current POC and pt in agreement with this plan, therefore will focus Acosta review of relevant ongoing home exercises and stretches for HEP next visit with intent to transition to HEP Acosta following visit.    Rehab Potential  Excellent    PT Treatment/Interventions  Patient/family education;Neuromuscular re-education;Therapeutic exercise;Therapeutic activities;Functional mobility training;Manual techniques;Passive range of motion;Dry needling;Taping;Spinal Manipulations;Electrical Stimulation;Moist Heat;Traction;Cryotherapy;Iontophoresis 10m/ml Dexamethasone;ADLs/Self Care Home Management    PT Next Visit Plan  Final HEP review/update - Lumbar & proximal LE flexibility and lumbopelvic/core strengthening; Manual therapy & modalities as indicated    Consulted and Agree with Plan of Care  Patient       Patient  will benefit from skilled therapeutic intervention in order to improve the following deficits and impairments:  Pain, Increased muscle spasms, Impaired flexibility, [name], Decreased strength, Postural dysfunction, Improper body mechanics, Decreased activity tolerance  Visit Diagnosis: Chronic right-sided low back pain with right-sided sciatica  Muscle spasm of back  Muscle weakness (generalized)  Problem List Patient Active Problem List   Diagnosis Date Noted  . Acute lower UTI 09/04/2018  . 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 Hematuria 09/04/2018  . BPH (benign prostatic hyperplasia) 09/04/2018  . Sepsis secondary to UTI ([place]) 09/04/2018  . UTI (urinary tract infection) 09/04/2018  . Abnormal ECG during exercise stress test     Percival Spanish, PT, MPT 11/04/2018, 10:33 AM  [facility] [address]  [place], [place], [address] Phone: [phone]   Fax:  [phone]  Name: Brandon Acosta MRN: [id number] Date of Birth: [birth year]

## 2018-11-07 ENCOUNTER — Ambulatory Visit: Payer: Medicare Other

## 2018-11-07 DIAGNOSIS — G8929 Other chronic pain: Secondary | ICD-10-CM | POA: Diagnosis not present

## 2018-11-07 DIAGNOSIS — M5441 Lumbago with sciatica, right side: Principal | ICD-10-CM

## 2018-11-07 DIAGNOSIS — M62830 Muscle spasm of back: Secondary | ICD-10-CM | POA: Diagnosis not present

## 2018-11-07 DIAGNOSIS — M6281 Muscle weakness (generalized): Secondary | ICD-10-CM | POA: Diagnosis not present

## 2018-11-07 NOTE — Therapy (Signed)
[facility] [address]  [place], [place], [address] Phone: [phone]   Fax:  [phone]  Physical Therapy Treatment  Patient Details  Name: Brandon Acosta MRN: [id number] Date of Birth: [birth year] Referring Provider (PT): Jean Rosenthal, MD   Encounter Date: 11/07/2018  PT End of Session - 11/07/18 0945    Visit Number  7    Number of Visits  8    Date for PT Re-Evaluation  11/12/18    Authorization Type  Medicare & BCBS    PT Start Time  0930    PT Stop Time  1014    PT Time Calculation (min)  44 min    Activity Tolerance  Patient tolerated treatment well    Behavior During Therapy  [facility] for tasks assessed/performed       Past Medical History:  Diagnosis Date  . Arthritis   . Diverticulitis   . GERD (gastroesophageal reflux disease)   . Hyperlipemia   . Osteoporosis     Past Surgical History:  Procedure Laterality Date  . APPENDECTOMY  as child  . CARDIAC CATHETERIZATION N/A 10/16/2016   Procedure: Left Heart Cath and Coronary Angiography;  Surgeon: Jettie Booze, MD;  Location: [facility];  Service: Cardiovascular;  Laterality: N/A;  . COLONOSCOPY WITH PROPOFOL N/A 02/11/2014   Procedure: COLONOSCOPY WITH PROPOFOL;  Surgeon: Cleotis Nipper, MD;  [facility];  Service: Endoscopy;  Laterality: N/A;  . KNEE ARTHROSCOPY Right 2013   torn meniscus repair    There were no vitals filed for this visit.  Subjective Assessment - 11/07/18 0944    Subjective  Pt. reporting he plans to transition to home program after last visit.  denies signficant soreness after last session.      Diagnostic tests  10/07/18 - Lumbar spine:  Degenerative disc disease L5-S1. 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 Lower lumbar facet changes. No acute fractures no other bony abnormalities.;  R hip x-ray: WNL    Patient Stated Goals  "would like a set of exercises that would help me  improve to the point where I don't need surgery"    Currently in Pain?  No/denies    Pain Score  0-No pain    Multiple Pain Sites  No                       [facility] Adult PT Treatment/Exercise - 11/07/18 0958      Self-Care   Self-Care  Other Self-Care Comments    Other Self-Care Comments   Final HEP review with updated with ball release to glutes on wall with tennis ball for home use; pt. verbalized understanding       Lumbar Exercises: Aerobic   Recumbent Bike  Lvl 3, 8 min       Lumbar Exercises: Machines for Strengthening   Other Lumbar Machine Exercise  B single arm low row 10# x 15 reps each       Lumbar Exercises: Standing   Forward Lunge  10 reps;3 seconds    Forward Lunge Limitations  single UE support on counter    Other Standing Lumbar Exercises  B pallof press in partial lunge position with double red TB x 15      Lumbar Exercises: Supine   Bridge  10 reps;3 seconds    Bridge Limitations  green TB at knees with alternating LAQ at top of movement x 1 each LE  Lumbar Exercises: Sidelying   Clam  Right;Left;10 reps    Clam Limitations  green band at knee              PT Education - 11/07/18 1135    Education Details  HEP update; glute self-ball release on wall     Person(s) Educated  Patient    Methods  Explanation;Demonstration;Verbal cues;Handout    Comprehension  Verbalized understanding;Returned demonstration;Verbal cues required;Need further instruction          PT Long Term Goals - 11/04/18 0934      PT LONG TERM GOAL #1   Title  Independent with ongoing HEP    Status  Partially Met      PT LONG TERM GOAL #2   Title  Patient to demonstrate appropriate posture and body mechanics needed for daily activities    Status  Achieved      PT LONG TERM GOAL #3   Title  Patient to improve lumbar AROM to [facility] without pain provocation     Status  On-going      PT LONG TERM GOAL #4   Title  B hip strength >/= 4+/5 to 5/5 for improved  stability    Status  On-going      PT LONG TERM GOAL #5   Title  Patient to report ability to perform ADLs, household tasks and recreational activities without increased low back or radicular R LE pain    Status  Partially Met            Plan - 11/07/18 0946    Clinical Impression Statement  Pt. reporting no issues with updated HEP.  Reports he wishes to transition to home program following next session with supervising PT aware of pt. wishes.  Reviewed updated HEP to check for proper technique with pt. able to demo all activities properly.  Reviewed comprehensive HEP and updated green TB for clam shell activity and provided handout for glute self-release on wall as pt. noting relief from this last session.  Ended visit with pt. pain free.  Will plan for final goal testing in upcoming visit for preparation for transition to home program.      Rehab Potential  Excellent    PT Treatment/Interventions  Patient/family education;Neuromuscular re-education;Therapeutic exercise;Therapeutic activities;Functional mobility training;Manual techniques;Passive range of motion;Dry needling;Taping;Spinal Manipulations;Electrical Stimulation;Moist Heat;Traction;Cryotherapy;Iontophoresis 18m/ml Dexamethasone;ADLs/Self Care Home Management    PT Next Visit Plan  Transition to home program     Consulted and Agree with Plan of Care  Patient       [name], Increased muscle spasms, Impaired flexibility, [name], Decreased strength, Postural dysfunction, Improper body mechanics, Decreased activity tolerance  Visit Diagnosis: Chronic right-sided low back pain with right-sided sciatica  Muscle spasm of back  Muscle weakness (generalized)     Problem List Patient Active Problem List   Diagnosis Date Noted  . Acute lower UTI 09/04/2018  . Prostatitis syndrome 09/04/2018   . Hematuria 09/04/2018  . BPH (benign prostatic hyperplasia) 09/04/2018  . [place] 09/04/2018  . UTI (urinary tract infection) 09/04/2018  . Abnormal ECG during exercise stress test     MBess Harvest PTA 11/07/18 11:35 AM   [facility] [place] [address]Phone: [phone]  Fax:  [phone] Name: Brandon SprolesMRN: [id number] of  Birth: 1941/05/18

## 2018-11-11 ENCOUNTER — Ambulatory Visit: Payer: Medicare Other

## 2018-11-11 DIAGNOSIS — M62830 Muscle spasm of back: Secondary | ICD-10-CM

## 2018-11-11 DIAGNOSIS — M5441 Lumbago with sciatica, right side: Principal | ICD-10-CM

## 2018-11-11 DIAGNOSIS — G8929 Other chronic pain: Secondary | ICD-10-CM

## 2018-11-11 DIAGNOSIS — M6281 Muscle weakness (generalized): Secondary | ICD-10-CM

## 2018-11-11 NOTE — Therapy (Addendum)
[facility] [address]  [place], [place], [address] Phone: [phone]   Fax:  [phone]  Physical Therapy Treatment / Discharge Summary  Patient Details  Name: Brandon Acosta MRN: [id number] Date of Birth: 03-10-1941 Referring Provider (PT): Jean Rosenthal, MD   Encounter Date: 11/11/2018  PT End of Session - 11/11/18 0933    Visit Number  8    Number of Visits  8    Date for PT Re-Evaluation  11/12/18    Authorization Type  Medicare & BCBS    PT Start Time  0930    PT Stop Time  1012    PT Time Calculation (min)  42 min    Activity Tolerance  Patient tolerated treatment well    Behavior During Therapy  [facility] for tasks assessed/performed       Past Medical History:  Diagnosis Date  . Arthritis   . Diverticulitis   . GERD (gastroesophageal reflux disease)   . Hyperlipemia   . Osteoporosis     Past Surgical History:  Procedure Laterality Date  . APPENDECTOMY  as child  . CARDIAC CATHETERIZATION N/A 10/16/2016   Procedure: Left Heart Cath and Coronary Angiography;  Surgeon: Jettie Booze, MD;  Location: [facility];  Service: Cardiovascular;  Laterality: N/A;  . COLONOSCOPY WITH PROPOFOL N/A 02/11/2014   Procedure: COLONOSCOPY WITH PROPOFOL;  Surgeon: Cleotis Nipper, MD;  [facility];  Service: Endoscopy;  Laterality: N/A;  . KNEE ARTHROSCOPY Right 2013   torn meniscus repair    There were no vitals filed for this visit.  Subjective Assessment - 11/11/18 1009    Subjective  Pt. reporting he wishes to transition to home program following today's visit.      Diagnostic tests  10/07/18 - Lumbar spine:  Degenerative disc disease L5-S1. 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 Lower lumbar facet changes. No acute fractures no other bony abnormalities.;  R hip x-ray: WNL    Patient Stated Goals  "would like a set of exercises that would help me improve to the point where  I don't need surgery"    Currently in Pain?  No/denies    Pain Score  0-No pain    Multiple Pain Sites  No         [facility] PT Assessment - 11/11/18 0001      AROM   AROM Assessment Site  Lumbar    Lumbar Flexion  hands to lower shins    Lumbar Extension  25%     Lumbar - Right Side Bend  hand to fibular head    Lumbar - Left Side Bend  hand to fibular head    Lumbar - Right Rotation  WFL     Lumbar - Left Rotation  [facility]      Strength   Strength Assessment Site  Hip;Knee;Ankle    Right/Left Hip  Right;Left    Right Hip Flexion  4+/5    Right Hip Extension  4/5    Right Hip External Rotation   4+/5    Right Hip Internal Rotation  4+/5    Right Hip ABduction  4+/5    Right Hip ADduction  4+/5    Left Hip Flexion  4+/5    Left Hip Extension  4/5    Left Hip External Rotation  4+/5    Left Hip Internal Rotation  4+/5    Left Hip ABduction  4+/5    Left Hip  ADduction  4+/5    Right/Left Knee  Right;Left    Right Knee Flexion  5/5    Right Knee Extension  5/5    Left Knee Flexion  5/5    Left Knee Extension  5/5    Right/Left Ankle  Right;Left    Right Ankle Dorsiflexion  5/5    Right Ankle Plantar Flexion  5/5    Left Ankle Dorsiflexion  5/5    Left Ankle Plantar Flexion  5/5                   [facility] Adult PT Treatment/Exercise - 11/11/18 0001      Self-Care   Self-Care  Other Self-Care Comments    Other Self-Care Comments   Final review of HEP to check for understanding       Lumbar Exercises: Stretches   Other Lumbar Stretch Exercise  B hip flexor stretch in kneeling position on airex pad x 30 sec each way     Other Lumbar Stretch Exercise  R mod thomas hip flexor stretch x 30 sec with strap      Lumbar Exercises: Aerobic   Recumbent Bike  Lvl 3, 8 min       Lumbar Exercises: Supine   Bridge  10 reps;3 seconds    Bridge Limitations  green TB at knees with alternating LAQ at top of movement x 2 each LE      Lumbar Exercises: Sidelying   Clam   Right;Left;10 reps   3" hold    Clam Limitations  green band at knee              PT Education - 11/11/18 1008    Education Details  HEP update    Person(s) Educated  Patient    Methods  Explanation;Demonstration;Verbal cues    Comprehension  Verbalized understanding;Returned demonstration;Verbal cues required;Need further instruction          PT Long Term Goals - 11/11/18 0936      PT LONG TERM GOAL #1   Title  Independent with ongoing HEP    Status  Achieved      PT LONG TERM GOAL #2   Title  Patient to demonstrate appropriate posture and body mechanics needed for daily activities    Status  Achieved      PT LONG TERM GOAL #3   Title  Patient to improve lumbar AROM to [facility] without pain provocation     Status  Partially Met   11/11/2018: met except 25% limitation remaining in lumbar extension      PT LONG TERM GOAL #4   Title  B hip strength >/= 4+/5 to 5/5 for improved stability    Status  Partially Met   11/11/2018: Achieved for all except B hip extension strength 4/5     PT LONG TERM GOAL #5   Title  Patient to report ability to perform ADLs, household tasks and recreational activities without increased low back or radicular R LE pain    Status  Achieved            Plan - 11/11/18 0934    Clinical Impression Statement  Pt. has made good progress with therapy.  Reports ~ 90% improvement in pain levels since starting therapy.  Able to demo nearly full lumbar AROM to within normal limitation now only 25% limited into lumbar extension partially achieving LTG #3.  Able to partially achieve LTG #4 with MMT today with B hip extension still 4/5  strength.  HEP reviewed with pt. and updated to encouraged further glute strengthening.  Pt. noting he is able to perform all ADLs, household tasks, and recreational activities without LBP or radicular pain limiting him now achieving LTG #5.  Wishes to transition to home program following today's visit with supervising PT  approving this plan.  Pt. now on 30-day hold from therapy.      Rehab Potential  Excellent    PT Treatment/Interventions  Patient/family education;Neuromuscular re-education;Therapeutic exercise;Therapeutic activities;Functional mobility training;Manual techniques;Passive range of motion;Dry needling;Taping;Spinal Manipulations;Electrical Stimulation;Moist Heat;Traction;Cryotherapy;Iontophoresis 110m/ml Dexamethasone;ADLs/Self Care Home Management    PT Next Visit Plan  30-day hold from therapy.      Consulted and Agree with Plan of Care  Patient       [name], Increased muscle spasms, Impaired flexibility, [name], Decreased strength, Postural dysfunction, Improper body mechanics, Decreased activity tolerance  Visit Diagnosis: Chronic right-sided low back pain with right-sided sciatica  Muscle spasm of back  Muscle weakness (generalized)     Problem List Patient Active Problem List   Diagnosis Date Noted  . Acute lower UTI 09/04/2018  . 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 Hematuria 09/04/2018  . BPH (benign prostatic hyperplasia) 09/04/2018  . S[place] 09/04[place]  . UTI (urinary tract infection) 09/04/2018  . Abnormal ECG during exercise stress test     MBess Harvest PTA 11/11/18 6[facility] [address] [place] [address]Phone: [phone]  Fax:  [phone] Name: WJavid KemlerMRN: [id number] of Birth: 410-14-1942 PHYSICAL THERAPY DISCHARGE SUMMARY  Visits from Start of Care: 8  Current functional level related to goals / functional outcomes:   Refer to above clinical impression for status as of last visit on 11/11/2018. Patient was placed on hold for 30 days and has not needed to return to PT, therefore will proceed with  discharge from PT for this episode.   Remaining deficits:   As above.   Education / Equipment:   HEP, PTraining and development officereducation Plan: Patient agrees to discharge.  Patient goals were mostly met. Patient is being discharged due to meeting the stated rehab goals.  ?????     JPercival Spanish PT, MPT 12/12/18, 8:15 AM  [facility]2[place] [address]Phone: [phone]  Fax:  [phone]

## 2018-11-14 ENCOUNTER — Ambulatory Visit: Payer: Medicare Other | Admitting: Physical Therapy

## 2018-11-24 DIAGNOSIS — R310 Gross hematuria: Secondary | ICD-10-CM | POA: Diagnosis not present

## 2018-11-24 DIAGNOSIS — Z87440 Personal history of urinary (tract) infections: Secondary | ICD-10-CM | POA: Diagnosis not present

## 2018-11-24 DIAGNOSIS — N138 Other obstructive and reflux uropathy: Secondary | ICD-10-CM | POA: Diagnosis not present

## 2018-11-24 DIAGNOSIS — Z79899 Other long term (current) drug therapy: Secondary | ICD-10-CM | POA: Diagnosis not present

## 2018-11-24 DIAGNOSIS — N401 Enlarged prostate with lower urinary tract symptoms: Secondary | ICD-10-CM | POA: Diagnosis not present

## 2018-11-24 DIAGNOSIS — N390 Urinary tract infection, site not specified: Secondary | ICD-10-CM | POA: Diagnosis not present

## 2018-11-24 DIAGNOSIS — R39198 Other difficulties with micturition: Secondary | ICD-10-CM | POA: Diagnosis not present

## 2018-11-27 ENCOUNTER — Ambulatory Visit (INDEPENDENT_AMBULATORY_CARE_PROVIDER_SITE_OTHER): Payer: Medicare Other | Admitting: Orthopaedic Surgery

## 2018-11-27 ENCOUNTER — Encounter (INDEPENDENT_AMBULATORY_CARE_PROVIDER_SITE_OTHER): Payer: Self-pay | Admitting: Orthopaedic Surgery

## 2018-11-27 DIAGNOSIS — G8929 Other chronic pain: Secondary | ICD-10-CM

## 2018-11-27 DIAGNOSIS — M5441 Lumbago with sciatica, right side: Secondary | ICD-10-CM | POA: Diagnosis not present

## 2018-11-27 NOTE — Progress Notes (Signed)
Office Visit Note   Patient: Brandon Acosta           Date of Birth: 1940-10-05           MRN: [id number] Visit Date: 11/27/2018              Requested by: [name], MD [place], [place] [address] PCP: [name], MD   Assessment & Plan: Visit Diagnoses:  1. Chronic right-sided low back pain with right-sided sciatica     Plan: At this point he does need an MRI of the lumbar spine to rule out nerve compression or herniated disc based on his plain film findings and his clinical exam findings combined with the failure of conservative treatment including anti-inflammatories and physical therapy.  He agrees with this treatment plan as well.  We will see him back in follow-up after having the MRI of his lumbar spine.  Follow-Up Instructions: Return in about 3 weeks (around 12/18/2018).   Orders:  No orders of the defined types were placed in this encounter.  No orders of the defined types were placed in this encounter.     Procedures: No procedures performed   Clinical Data: No additional findings.   Subjective: Chief Complaint  Patient presents with  . Lower Back - Follow-up  The patient is coming in with an acute flareup of his low back pain with right-sided sciatica.  He has had successful physical therapy in the past and is trying the home exercises again but is really not helping at all.  He still has a lot of pain in his low back in the sciatic region to the right side as he did before.  He is 78 years old and very active.  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  We have never MRI of his back.  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  He denies any groin pain.  He denies any leg weakness.  He does have some numbness and tingling.  HPI  Review of Systems He currently denies any headache, chest pain, shortness of breath, fever,  chills, nausea, vomiting.  He denies any change in bowel bladder function  Objective: Vital Signs: There were no vitals taken for this visit.  Physical Exam He is alert and orient x3 and in no acute distress Ortho Exam Examination of his right hip is normal he is got excellent strength in his bilateral extremities but he does have a positive straight leg raise to the right side and not the left side.  He has pain on stretch with sciatic nerve and ischio pain as well. Specialty Comments:  No specialty comments available.  Imaging: No results found.   PMFS History: Patient Active Problem List   Diagnosis Date Noted  . Acute lower UTI 09/04/2018  . 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 Hematuria 09/04/2018  . BPH (benign prostatic hyperplasia) 09/04/2018  . Sepsis secondary to UTI ([facility]) 09/04/2018  . UTI (urinary tract infection) 09/04/2018  . Abnormal ECG during exercise stress test    Past Medical History:  Diagnosis Date  . Arthritis   . Diverticulitis   . GERD (gastroesophageal reflux disease)   . Hyperlipemia   . Osteoporosis     History reviewed. No pertinent family history.  Past Surgical History:  Procedure Laterality Date  . APPENDECTOMY  as child  . CARDIAC  CATHETERIZATION N/A 10/16/2016   Procedure: Left Heart Cath and Coronary Angiography;  Surgeon: Jettie Booze, MD;  Location: [facility];  Service: Cardiovascular;  Laterality: N/A;  . COLONOSCOPY WITH PROPOFOL N/A 02/11/2014   Procedure: COLONOSCOPY WITH PROPOFOL;  Surgeon: Cleotis Nipper, MD;  Location: [facility];  Service: Endoscopy;  Laterality: N/A;  . KNEE ARTHROSCOPY Right 2013   torn meniscus repair   Social History   Occupational History  . Not on file  Tobacco Use  . Smoking status: Former Smoker    Packs/day: 0.50    Years: 20.00    Pack years: 10.00    Types: Cigarettes    Last attempt to quit: 09/24/1976    Years since quitting: 42.2  . Smokeless tobacco: Never Used    Substance and Sexual Activity  . Alcohol use: Yes    Comment: occassionally 1 glass wine per day  . Drug use: No  . Sexual activity: Not on file

## 2018-11-28 ENCOUNTER — Other Ambulatory Visit (INDEPENDENT_AMBULATORY_CARE_PROVIDER_SITE_OTHER): Payer: Self-pay

## 2018-11-28 DIAGNOSIS — M4807 Spinal stenosis, lumbosacral region: Secondary | ICD-10-CM

## 2018-12-01 ENCOUNTER — Ambulatory Visit (INDEPENDENT_AMBULATORY_CARE_PROVIDER_SITE_OTHER): Payer: Medicare Other | Admitting: Orthopaedic Surgery

## 2018-12-02 DIAGNOSIS — N390 Urinary tract infection, site not specified: Secondary | ICD-10-CM | POA: Diagnosis not present

## 2018-12-02 DIAGNOSIS — N281 Cyst of kidney, acquired: Secondary | ICD-10-CM | POA: Diagnosis not present

## 2018-12-02 DIAGNOSIS — R310 Gross hematuria: Secondary | ICD-10-CM | POA: Diagnosis not present

## 2018-12-02 DIAGNOSIS — N400 Enlarged prostate without lower urinary tract symptoms: Secondary | ICD-10-CM | POA: Diagnosis not present

## 2018-12-07 ENCOUNTER — Ambulatory Visit
Admission: RE | Admit: 2018-12-07 | Discharge: 2018-12-07 | Disposition: A | Discharge status: Home / Self Care | Payer: Medicare Other | Source: Ambulatory Visit | Attending: Orthopaedic Surgery | Admitting: Orthopaedic Surgery

## 2018-12-07 ENCOUNTER — Other Ambulatory Visit: Payer: Self-pay

## 2018-12-07 DIAGNOSIS — M48061 Spinal stenosis, lumbar region without neurogenic claudication: Secondary | ICD-10-CM | POA: Diagnosis not present

## 2018-12-07 DIAGNOSIS — M5116 Intervertebral disc disorders with radiculopathy, lumbar region: Secondary | ICD-10-CM | POA: Diagnosis not present

## 2018-12-07 DIAGNOSIS — M4807 Spinal stenosis, lumbosacral region: Secondary | ICD-10-CM

## 2018-12-09 DIAGNOSIS — Z87440 Personal history of urinary (tract) infections: Secondary | ICD-10-CM | POA: Diagnosis not present

## 2018-12-09 DIAGNOSIS — N401 Enlarged prostate with lower urinary tract symptoms: Secondary | ICD-10-CM | POA: Diagnosis not present

## 2018-12-09 DIAGNOSIS — N390 Urinary tract infection, site not specified: Secondary | ICD-10-CM | POA: Diagnosis not present

## 2018-12-09 DIAGNOSIS — N138 Other obstructive and reflux uropathy: Secondary | ICD-10-CM | POA: Diagnosis not present

## 2018-12-11 ENCOUNTER — Telehealth (INDEPENDENT_AMBULATORY_CARE_PROVIDER_SITE_OTHER): Payer: Self-pay | Admitting: Physician Assistant

## 2018-12-11 NOTE — Telephone Encounter (Signed)
Please advise, I can call him if you would like

## 2018-12-11 NOTE — Telephone Encounter (Signed)
Patient called asked if he can get his MRI results over the phone instead of coming into the office.  The number to contact patient is [phone]

## 2018-12-16 ENCOUNTER — Other Ambulatory Visit (INDEPENDENT_AMBULATORY_CARE_PROVIDER_SITE_OTHER): Payer: Self-pay

## 2018-12-16 DIAGNOSIS — M5441 Lumbago with sciatica, right side: Principal | ICD-10-CM

## 2018-12-16 DIAGNOSIS — G8929 Other chronic pain: Secondary | ICD-10-CM

## 2018-12-16 NOTE — Telephone Encounter (Signed)
Entered order

## 2018-12-16 NOTE — Telephone Encounter (Signed)
I spoke with him.  I explained the MRI results.  He does need an appointment when possible with Dr. Ernestina Patches for right-sided L3-L4 and L4-L5 ESI's.

## 2018-12-18 ENCOUNTER — Ambulatory Visit (INDEPENDENT_AMBULATORY_CARE_PROVIDER_SITE_OTHER): Payer: Medicare Other | Admitting: Orthopaedic Surgery

## 2019-01-21 DIAGNOSIS — Z85828 Personal history of other malignant neoplasm of skin: Secondary | ICD-10-CM | POA: Diagnosis not present

## 2019-01-21 DIAGNOSIS — L570 Actinic keratosis: Secondary | ICD-10-CM | POA: Diagnosis not present

## 2019-01-28 ENCOUNTER — Encounter (INDEPENDENT_AMBULATORY_CARE_PROVIDER_SITE_OTHER): Payer: Self-pay | Admitting: Physical Medicine and Rehabilitation

## 2019-02-10 ENCOUNTER — Encounter: Payer: Self-pay | Admitting: Physical Medicine and Rehabilitation

## 2019-02-10 ENCOUNTER — Ambulatory Visit (INDEPENDENT_AMBULATORY_CARE_PROVIDER_SITE_OTHER): Payer: Medicare Other | Admitting: Physical Medicine and Rehabilitation

## 2019-02-10 ENCOUNTER — Ambulatory Visit: Payer: Self-pay

## 2019-02-10 ENCOUNTER — Other Ambulatory Visit: Payer: Self-pay

## 2019-02-10 VITALS — BP 135/69 | HR 60

## 2019-02-10 DIAGNOSIS — M5416 Radiculopathy, lumbar region: Secondary | ICD-10-CM | POA: Diagnosis not present

## 2019-02-10 DIAGNOSIS — M48062 Spinal stenosis, lumbar region with neurogenic claudication: Secondary | ICD-10-CM | POA: Diagnosis not present

## 2019-02-10 MED ORDER — BETAMETHASONE SOD PHOS & ACET 6 (3-3) MG/ML IJ SUSP
12.0000 mg | Freq: Once | INTRAMUSCULAR | Status: AC
  Administered 2019-02-10: 12 mg

## 2019-02-10 NOTE — Progress Notes (Signed)
.  Numeric Pain Rating Scale and Functional Assessment Average Pain 6   In the last MONTH (on 0-10 scale) has pain interfered with the following?  1. General activity like being  able to carry out your everyday physical activities such as walking, climbing stairs, carrying groceries, or moving a chair?  Rating(5)   +Driver, -BT, -Dye Allergies.  

## 2019-02-10 NOTE — Procedures (Signed)
Lumbosacral Transforaminal Epidural Steroid Injection - Sub-Pedicular Approach with Fluoroscopic Guidance  Patient: Brandon Acosta      Date of Birth: 1940/11/08 MRN: [id number] PCP: [name], MD      Visit Date: 02/10/2019   Universal Protocol:    Date/Time: 02/10/2019  Consent Given By: the patient  Position: PRONE  Additional Comments: Vital signs were monitored before and after the procedure. Patient was prepped and draped in the usual sterile fashion. The correct patient, procedure, and site was verified.   Injection Procedure Details:  Procedure Site One Meds Administered:  Meds ordered this encounter  Medications  . betamethasone acetate-betamethasone sodium phosphate (CELESTONE) injection 12 mg    Laterality: Right  Location/Site:  L3-L4 L4-L5  Needle size: 22 G  Needle type: Spinal  Needle Placement: Transforaminal  Findings:    -Comments: Excellent flow of contrast along the nerve and into the epidural space.  Procedure Details: After squaring off the end-plates to get a true AP view, the C-arm was positioned so that an oblique view of the foramen as noted above was visualized. The target area is just inferior to the "nose of the scotty dog" or sub pedicular. The soft tissues overlying this structure were infiltrated with 2-3 ml. of 1% Lidocaine without Epinephrine.  The spinal needle was inserted toward the target using a "trajectory" view along the fluoroscope beam.  Under AP and lateral visualization, the needle was advanced so it did not puncture dura and was located close the 6 O'Clock position of the pedical in AP tracterory. Biplanar projections were used to confirm position. Aspiration was confirmed to be negative for CSF and/or blood. A 1-2 ml. volume of Isovue-250 was injected and flow of contrast was noted at each level. Radiographs were obtained for documentation purposes.   After attaining the desired flow of contrast documented above, a  0.5 to 1.0 ml test dose of 0.25% Marcaine was injected into each respective transforaminal space.  The patient was observed for 90 seconds post injection.  After no sensory deficits were reported, and normal lower extremity motor function was noted,   the above injectate was administered so that equal amounts of the injectate were placed at each foramen (level) into the transforaminal epidural space.   Additional Comments:  The patient tolerated the procedure well Dressing: 2 x 2 sterile gauze and Band-Aid    Post-procedure details: Patient was observed during the procedure. Post-procedure instructions were reviewed.  Patient left the clinic in stable condition.

## 2019-02-10 NOTE — Progress Notes (Signed)
Brandon Acosta - 78 y.o. male MRN [id number]  Date of birth: 16-Dec-1940  Office Visit Note: Visit Date: 02/10/2019 PCP: [name], MD Referred by: [name], MD  Subjective: Chief Complaint  Patient presents with   Lower Back - Pain   Right Leg - Pain   HPI: Brandon Acosta is a 78 y.o. male who comes in today At the request of Dr. Jean Rosenthal for right L3 and L4 transforaminal epidural steroid injection diagnostically hopefully therapeutically.  Patient is having chronic worsening right hip and leg pain with some paresthesia into the calf anteriorly.  Symptoms more closely related to an L4 nerve root possibly L5.  At L3-4 he has significant facet arthropathy on the right with distortion of the canal and lateral recess narrowing along with foraminal narrowing.  At L4-5 there is a grade 1 listhesis with facet arthropathy and significant stenosis bilaterally in the foramen similar finding of L5 in terms of foraminal stenosis from facet arthritis.  Any of those could be causing symptoms we went over this at length with him however I do feel like the fact that he has more of this canal lateral recess narrowing at 3-4 that that may be the symptomatic region.  He also reports this actually worse with sitting which does make you think more of a lumbar disc issue and there could even be a small sequestered fragment at the L3-4 region.  Depending on relief would look at L5 transforaminal injection.  ROS Otherwise per HPI.  Assessment & Plan: Visit Diagnoses:  1. Lumbar radiculopathy   2. Spinal stenosis of lumbar region with neurogenic claudication     Plan: No additional findings.   Meds & Orders:  Meds ordered this encounter  Medications   betamethasone acetate-betamethasone sodium phosphate (CELESTONE) injection 12 mg    Orders Placed This Encounter  Procedures   XR C-ARM NO REPORT   Epidural Steroid injection    Follow-up: Return if symptoms worsen or fail to  improve.   Procedures: No procedures performed  Lumbosacral Transforaminal Epidural Steroid Injection - Sub-Pedicular Approach with Fluoroscopic Guidance  Patient: Brandon Acosta      Date of Birth: [birth year] MRN: [id number] PCP: [name], MD      Visit Date: 02/10/2019   Universal Protocol:    Date/Time: 02/10/2019  Consent Given By: the patient  Position: PRONE  Additional Comments: Vital signs were monitored before and after the procedure. Patient was prepped and draped in the usual sterile fashion. The correct patient, procedure, and site was verified.   Injection Procedure Details:  Procedure Site One Meds Administered:  Meds ordered this encounter  Medications   betamethasone acetate-betamethasone sodium phosphate (CELESTONE) injection 12 mg    Laterality: Right  Location/Site:  L3-L4 L4-L5  Needle size: 22 G  Needle type: Spinal  Needle Placement: Transforaminal  Findings:    -Comments: Excellent flow of contrast along the nerve and into the epidural space.  Procedure Details: After squaring off the end-plates to get a true AP view, the C-arm was positioned so that an oblique view of the foramen as noted above was visualized. The target area is just inferior to the "nose of the scotty dog" or sub pedicular. The soft tissues overlying this structure were infiltrated with 2-3 ml. of 1% Lidocaine without Epinephrine.  The spinal needle was inserted toward the target using a "trajectory" view along the fluoroscope beam.  Under AP and lateral visualization, the needle was advanced so it did not puncture dura  and was located close the 6 O'Clock position of the pedical in AP tracterory. Biplanar projections were used to confirm position. Aspiration was confirmed to be negative for CSF and/or blood. A 1-2 ml. volume of Isovue-250 was injected and flow of contrast was noted at each level. Radiographs were obtained for documentation purposes.   After  attaining the desired flow of contrast documented above, a 0.5 to 1.0 ml test dose of 0.25% Marcaine was injected into each respective transforaminal space.  The patient was observed for 90 seconds post injection.  After no sensory deficits were reported, and normal lower extremity motor function was noted,   the above injectate was administered so that equal amounts of the injectate were placed at each foramen (level) into the transforaminal epidural space.   Additional Comments:  The patient tolerated the procedure well Dressing: 2 x 2 sterile gauze and Band-Aid    Post-procedure details: Patient was observed during the procedure. Post-procedure instructions were reviewed.  Patient left the clinic in stable condition.    Clinical History: MRI LUMBAR SPINE WITHOUT CONTRAST  TECHNIQUE: Multiplanar, multisequence MR imaging of the lumbar spine was performed. No intravenous contrast was administered.  COMPARISON:  None.  FINDINGS: Segmentation: Normal. The lowest disc space is considered to be L5-S1.  Alignment:  Grade 1 anterolisthesis at L4-5  Vertebrae: Edema at the right L4 pedicle and superior articular facet. No compression fracture or aggressive osseous lesion.  Conus medullaris and cauda equina: The conus medullaris terminates at the L1 level. The cauda equina and conus medullaris are both normal.  Paraspinal and other soft tissues: The visualized retroperitoneal organs and paraspinal soft tissues are normal.  Disc levels: Sagittal plane imaging includes the T11-12 disc level through the upper sacrum, with axial imaging of the T12-L1 to L5-S1 disc levels.  T12-L1: Normal.  L1-2: Normal.  L2-3: Disc desiccation with mild bulge. No spinal canal or neural foraminal stenosis.  L3-4: Intermediate disc bulge with severe right and mild left facet hypertrophy, severely narrowing the right half of the spinal canal and deviating the nerve roots to the  left. There is severe right and mild left neural foraminal stenosis.  L4-5: Severe bilateral facet hypertrophy with grade 1 anterolisthesis and disc uncovering. Severe right and mild left neural foraminal stenosis. Moderate central spinal canal stenosis.  L5-S1: Right eccentric disc bulge and moderate facet hypertrophy. No spinal canal stenosis. Severe bilateral neural foraminal stenosis.  The visualized portion of the sacrum is normal.  IMPRESSION: 1. Severe spinal canal stenosis at L3-4, predominantly affecting the right half of the spinal canal, secondary to severe right facet arthrosis and intermediate sized disc bulge. There is severe right and mild left neural foraminal stenosis as well. CT might be helpful to determine if the right facet abnormalities entirely osseous or if there is a component of sequestered disc or ligamentous hypertrophy. 2. L4-5 severe facet arthrosis with severe right, mild left neural foraminal stenosis and moderate central spinal canal stenosis. 3. L5-S1 severe bilateral neural foraminal stenosis.   Electronically Signed   By: Ulyses Jarred M.D.   On: 12/07/2018 22:29   He reports that he quit smoking about 42 years ago. His smoking use included cigarettes. He has a 10.00 pack-year smoking history. He has never used smokeless tobacco. No results for input(s): HGBA1C, LABURIC in the last 8760 hours.  Objective:  VS:  HT:     WT:    BMI:      BP:135/69   HR:60bpm  TEMP: ( )   RESP:  Physical Exam  Ortho Exam Imaging: Xr C-arm No Report  Result Date: 02/10/2019 Please see Notes tab for imaging impression.   Past Medical/Family/Surgical/Social History: Medications & Allergies reviewed per EMR, new medications updated. 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 No pertinent family history. Past Surgical History:  Procedure Laterality Date   APPENDECTOMY  as child   CARDIAC CATHETERIZATION N/A 10/16/2016   Procedure: Left Heart Cath and Coronary Angiography;  Surgeon: Jettie Booze, MD;  Location: [facility];  Service: Cardiovascular;  Laterality: N/A;   COLONOSCOPY WITH PROPOFOL N/A 02/11/2014   Procedure: COLONOSCOPY WITH PROPOFOL;  Surgeon: Cleotis Nipper, MD;  Location: [facility];  Service: Endoscopy;  Laterality: N/A;   KNEE ARTHROSCOPY Right 2013   torn meniscus repair   Social History   Occupational History   Not on file  Tobacco Use   Smoking status: Former Smoker    Packs/day: 0.50    Years: 20.00    Pack years: 10.00    Types: Cigarettes    Last attempt to quit: 09/24/1976    Years since quitting: 42.4   Smokeless tobacco: Never Used  Substance and Sexual Activity   Alcohol use: Yes    Comment: occassionally 1 glass wine per day   Drug use: No   Sexual activity: Not on file

## 2019-02-25 ENCOUNTER — Ambulatory Visit: Payer: Self-pay | Admitting: Specialist

## 2019-04-28 ENCOUNTER — Encounter: Payer: Self-pay | Admitting: Physician Assistant

## 2019-04-28 ENCOUNTER — Ambulatory Visit (INDEPENDENT_AMBULATORY_CARE_PROVIDER_SITE_OTHER): Payer: Medicare Other | Admitting: Physician Assistant

## 2019-04-28 ENCOUNTER — Ambulatory Visit (INDEPENDENT_AMBULATORY_CARE_PROVIDER_SITE_OTHER): Payer: Medicare Other

## 2019-04-28 DIAGNOSIS — M25561 Pain in right knee: Secondary | ICD-10-CM

## 2019-04-28 DIAGNOSIS — M1711 Unilateral primary osteoarthritis, right knee: Secondary | ICD-10-CM

## 2019-04-28 MED ORDER — LIDOCAINE HCL 1 % IJ SOLN
3.0000 mL | INTRAMUSCULAR | Status: AC | PRN
  Administered 2019-04-28: 3 mL

## 2019-04-28 MED ORDER — METHYLPREDNISOLONE ACETATE 40 MG/ML IJ SUSP
40.0000 mg | INTRAMUSCULAR | Status: AC | PRN
  Administered 2019-04-28: 40 mg via INTRA_ARTICULAR

## 2019-04-28 NOTE — Progress Notes (Signed)
Office Visit Note   Patient: Brandon Acosta           Date of Birth: 11/04/1940           MRN: [id number] Visit Date: 04/28/2019              Requested by: [name], MD [place],  [place] [address] PCP: [name], MD   Assessment & Plan: Visit Diagnoses:  1. Right knee pain, unspecified chronicity   2. Primary osteoarthritis of right knee     Plan: He will follow-up in 2 weeks to check his response to the injection.  He may benefit from a supplemental injection in the future.  Questions were encouraged and answered at length.  Follow-Up Instructions: No follow-ups on file.   Orders:  Orders Placed This Encounter  Procedures  . Large Joint Inj  . XR Knee 1-2 Views Right   No orders of the defined types were placed in this encounter.     Procedures: Large Joint Inj: R knee on 04/28/2019 4:26 PM Indications: pain Details: 22 G 1.5 in needle, anterolateral approach  Arthrogram: No  Medications: 3 mL lidocaine 1 %; 40 mg methylPREDNISolone acetate 40 MG/ML Outcome: tolerated well, no immediate complications Procedure, treatment alternatives, risks and benefits explained, specific risks discussed. Consent was given by the patient. Immediately prior to procedure a time out was called to verify the correct patient, procedure, equipment, support staff and site/side marked as required. Patient was prepped and draped in the usual sterile fashion.       Clinical Data: No additional findings.   Subjective: Chief Complaint  Patient presents with  . Right Knee - Pain    HPI Brandon Acosta comes in today with a new complaint of right knee pain for months.  He states he has had no actual injury.  He states he is to stretching his leg in the pool and had pain medial aspect of the knee.  He has had no mechanical symptoms.  He notes some swelling of the knee.  Has a history of partial meniscectomy involving the right knee some years ago.  Has had no prior  pain in the knee or mechanical symptoms.  He has been using a knee brace and taking ibuprofen 1 tablet morning 1 tablet in the evening.  Review of Systems Please see HPI otherwise negative or noncontributory  Objective: Vital Signs: There were no vitals taken for this visit.  Physical Exam Constitutional:      Appearance: He is not ill-appearing or diaphoretic.  Pulmonary:     Effort: Pulmonary effort is normal.  Neurological:     Mental Status: He is alert and oriented to person, place, and time.  Psychiatric:        Mood and Affect: Mood normal.        Behavior: Behavior normal.     Ortho Exam Bilateral knees excellent range of motion no instability valgus varus stressing.  Mild effusion right knee.  No abnormal warmth erythema of either knee.  Slight tenderness along the medial joint line of the right knee.  Specialty Comments:  No specialty comments available.  Imaging: Xr Knee 1-2 Views Right  Result Date: 04/28/2019 AP and lateral view right knee: Shows tricompartmental changes.  Moderately severe narrowing of the medial compartment.  Osteophytes off the lateral margins of the both knees on the AP view.  Moderate narrowing of the left knee medial joint line.  Moderate patellofemoral changes lateral compartment right  knee.    PMFS History: Patient Active Problem List   Diagnosis Date Noted  . Primary osteoarthritis of right knee 04/28/2019  . Acute lower UTI 09/04/2018  . 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 Hematuria 09/04/2018  . BPH (benign prostatic hyperplasia) 09/04/2018  . Sepsis secondary to UTI ([facility]) 09/04/2018  . UTI (urinary tract infection) 09/04/2018  . Abnormal ECG during exercise stress test    Past Medical History:  Diagnosis Date  . Arthritis   . Diverticulitis   . GERD (gastroesophageal reflux disease)   . Hyperlipemia   . Osteoporosis     No family history on file.  Past Surgical History:  Procedure Laterality Date  . APPENDECTOMY  as  child  . CARDIAC CATHETERIZATION N/A 10/16/2016   Procedure: Left Heart Cath and Coronary Angiography;  Surgeon: Jettie Booze, MD;  Location: [facility];  Service: Cardiovascular;  Laterality: N/A;  . COLONOSCOPY WITH PROPOFOL N/A 02/11/2014   Procedure: COLONOSCOPY WITH PROPOFOL;  Surgeon: Cleotis Nipper, MD;  Location: [facility];  Service: Endoscopy;  Laterality: N/A;  . KNEE ARTHROSCOPY Right 2013   torn meniscus repair   Social History   Occupational History  . Not on file  Tobacco Use  . Smoking status: Former Smoker    Packs/day: 0.50    Years: 20.00    Pack years: 10.00    Types: Cigarettes    Quit date: 09/24/1976    Years since quitting: 42.6  . Smokeless tobacco: Never Used  Substance and Sexual Activity  . Alcohol use: Yes    Comment: occassionally 1 glass wine per day  . Drug use: No  . Sexual activity: Not on file

## 2019-05-12 ENCOUNTER — Ambulatory Visit (INDEPENDENT_AMBULATORY_CARE_PROVIDER_SITE_OTHER): Payer: Medicare Other | Admitting: Orthopaedic Surgery

## 2019-05-12 ENCOUNTER — Encounter: Payer: Self-pay | Admitting: Orthopaedic Surgery

## 2019-05-12 DIAGNOSIS — M1711 Unilateral primary osteoarthritis, right knee: Secondary | ICD-10-CM

## 2019-05-12 DIAGNOSIS — M25561 Pain in right knee: Secondary | ICD-10-CM

## 2019-05-12 NOTE — Progress Notes (Signed)
HPI: Brandon Acosta returns today 2 weeks status post right knee injection.  He states the knee is progressively gotten better and is now 80 to 90% improved and still feels as if it is improving daily he is taking over-the-counter NSAID.  He has been out to play golf on couple of occasions since we saw him 2 weeks ago and states he is done well some minimal discomfort.  Physical exam: Right knee good range of motion without pain  Impression: Right knee osteoarthritis  Plan: At this point time we will have him follow-up with [place] on an as-needed basis.  He can call our office with her supplemental injection if he chooses especially with the cortisone injection is short-lived.  Questions were encouraged and answered.

## 2019-05-14 DIAGNOSIS — M5416 Radiculopathy, lumbar region: Secondary | ICD-10-CM | POA: Diagnosis not present

## 2019-05-14 DIAGNOSIS — E785 Hyperlipidemia, unspecified: Secondary | ICD-10-CM | POA: Diagnosis not present

## 2019-05-14 DIAGNOSIS — M899 Disorder of bone, unspecified: Secondary | ICD-10-CM | POA: Diagnosis not present

## 2019-05-14 DIAGNOSIS — Z79899 Other long term (current) drug therapy: Secondary | ICD-10-CM | POA: Diagnosis not present

## 2019-05-14 DIAGNOSIS — R7301 Impaired fasting glucose: Secondary | ICD-10-CM | POA: Diagnosis not present

## 2019-06-05 DIAGNOSIS — Z79899 Other long term (current) drug therapy: Secondary | ICD-10-CM | POA: Diagnosis not present

## 2019-06-05 DIAGNOSIS — Z1159 Encounter for screening for other viral diseases: Secondary | ICD-10-CM | POA: Diagnosis not present

## 2019-06-05 DIAGNOSIS — E785 Hyperlipidemia, unspecified: Secondary | ICD-10-CM | POA: Diagnosis not present

## 2019-06-05 DIAGNOSIS — M5416 Radiculopathy, lumbar region: Secondary | ICD-10-CM | POA: Diagnosis not present

## 2019-06-05 DIAGNOSIS — R7301 Impaired fasting glucose: Secondary | ICD-10-CM | POA: Diagnosis not present

## 2019-06-05 DIAGNOSIS — M899 Disorder of bone, unspecified: Secondary | ICD-10-CM | POA: Diagnosis not present

## 2019-06-11 DIAGNOSIS — N401 Enlarged prostate with lower urinary tract symptoms: Secondary | ICD-10-CM | POA: Diagnosis not present

## 2019-06-11 DIAGNOSIS — Z8739 Personal history of other diseases of the musculoskeletal system and connective tissue: Secondary | ICD-10-CM | POA: Diagnosis not present

## 2019-06-11 DIAGNOSIS — R7301 Impaired fasting glucose: Secondary | ICD-10-CM | POA: Diagnosis not present

## 2019-06-11 DIAGNOSIS — Z86010 Personal history of colonic polyps: Secondary | ICD-10-CM | POA: Diagnosis not present

## 2019-06-11 DIAGNOSIS — Z79899 Other long term (current) drug therapy: Secondary | ICD-10-CM | POA: Diagnosis not present

## 2019-06-11 DIAGNOSIS — E785 Hyperlipidemia, unspecified: Secondary | ICD-10-CM | POA: Diagnosis not present

## 2019-06-11 DIAGNOSIS — Z0000 Encounter for general adult medical examination without abnormal findings: Secondary | ICD-10-CM | POA: Diagnosis not present

## 2019-06-13 DIAGNOSIS — Z23 Encounter for immunization: Secondary | ICD-10-CM | POA: Diagnosis not present

## 2019-10-19 DIAGNOSIS — H2513 Age-related nuclear cataract, bilateral: Secondary | ICD-10-CM | POA: Diagnosis not present

## 2019-10-19 DIAGNOSIS — H524 Presbyopia: Secondary | ICD-10-CM | POA: Diagnosis not present

## 2020-03-07 DIAGNOSIS — H0011 Chalazion right upper eyelid: Secondary | ICD-10-CM | POA: Diagnosis not present

## 2020-03-11 DIAGNOSIS — H0011 Chalazion right upper eyelid: Secondary | ICD-10-CM | POA: Diagnosis not present

## 2020-03-18 DIAGNOSIS — E785 Hyperlipidemia, unspecified: Secondary | ICD-10-CM | POA: Diagnosis not present

## 2020-03-18 DIAGNOSIS — Z79899 Other long term (current) drug therapy: Secondary | ICD-10-CM | POA: Diagnosis not present

## 2020-06-02 DIAGNOSIS — Z23 Encounter for immunization: Secondary | ICD-10-CM | POA: Diagnosis not present

## 2020-06-17 ENCOUNTER — Ambulatory Visit: Payer: Medicare Other | Attending: Internal Medicine

## 2020-06-17 ENCOUNTER — Other Ambulatory Visit: Payer: Self-pay

## 2020-06-17 DIAGNOSIS — Z23 Encounter for immunization: Secondary | ICD-10-CM

## 2020-06-17 NOTE — Progress Notes (Signed)
   Covid-19 Vaccination Clinic  Name:  Brandon Acosta    MRN: [id number] DOB: 12/07/1940  06/17/2020  [name] was observed post Covid-19 immunization for 15 minutes without incident. He was provided with Vaccine Information Sheet and instruction to access the V-Safe system. Vaccinated by [facility].  [name] was instructed to call 911 with any severe reactions post vaccine: Marland Kitchen Difficulty breathing  . Swelling of face and throat  . A fast heartbeat  . A bad rash all over body  . Dizziness and weakness

## 2020-06-21 ENCOUNTER — Ambulatory Visit: Payer: Medicare Other

## 2020-06-24 DIAGNOSIS — E785 Hyperlipidemia, unspecified: Secondary | ICD-10-CM | POA: Diagnosis not present

## 2020-06-24 DIAGNOSIS — Z79899 Other long term (current) drug therapy: Secondary | ICD-10-CM | POA: Diagnosis not present

## 2020-06-30 DIAGNOSIS — Z86010 Personal history of colonic polyps: Secondary | ICD-10-CM | POA: Diagnosis not present

## 2020-06-30 DIAGNOSIS — Z8739 Personal history of other diseases of the musculoskeletal system and connective tissue: Secondary | ICD-10-CM | POA: Diagnosis not present

## 2020-06-30 DIAGNOSIS — N401 Enlarged prostate with lower urinary tract symptoms: Secondary | ICD-10-CM | POA: Diagnosis not present

## 2020-06-30 DIAGNOSIS — I2510 Atherosclerotic heart disease of native coronary artery without angina pectoris: Secondary | ICD-10-CM | POA: Diagnosis not present

## 2020-06-30 DIAGNOSIS — Z79899 Other long term (current) drug therapy: Secondary | ICD-10-CM | POA: Diagnosis not present

## 2020-06-30 DIAGNOSIS — R7309 Other abnormal glucose: Secondary | ICD-10-CM | POA: Diagnosis not present

## 2020-06-30 DIAGNOSIS — K219 Gastro-esophageal reflux disease without esophagitis: Secondary | ICD-10-CM | POA: Diagnosis not present

## 2020-06-30 DIAGNOSIS — Z0000 Encounter for general adult medical examination without abnormal findings: Secondary | ICD-10-CM | POA: Diagnosis not present

## 2020-06-30 DIAGNOSIS — E785 Hyperlipidemia, unspecified: Secondary | ICD-10-CM | POA: Diagnosis not present

## 2020-06-30 DIAGNOSIS — R7301 Impaired fasting glucose: Secondary | ICD-10-CM | POA: Diagnosis not present

## 2020-08-03 DIAGNOSIS — L570 Actinic keratosis: Secondary | ICD-10-CM | POA: Diagnosis not present

## 2020-08-03 DIAGNOSIS — Z85828 Personal history of other malignant neoplasm of skin: Secondary | ICD-10-CM | POA: Diagnosis not present

## 2020-08-03 DIAGNOSIS — D044 Carcinoma in situ of skin of scalp and neck: Secondary | ICD-10-CM | POA: Diagnosis not present

## 2020-08-03 DIAGNOSIS — D485 Neoplasm of uncertain behavior of skin: Secondary | ICD-10-CM | POA: Diagnosis not present

## 2020-08-05 DIAGNOSIS — M8589 Other specified disorders of bone density and structure, multiple sites: Secondary | ICD-10-CM | POA: Diagnosis not present

## 2020-08-24 DIAGNOSIS — H0015 Chalazion left lower eyelid: Secondary | ICD-10-CM | POA: Diagnosis not present

## 2020-09-24 HISTORY — PX: PROSTATE SURGERY: SHX751

## 2020-11-16 DIAGNOSIS — D2371 Other benign neoplasm of skin of right lower limb, including hip: Secondary | ICD-10-CM | POA: Diagnosis not present

## 2020-11-16 DIAGNOSIS — D2261 Melanocytic nevi of right upper limb, including shoulder: Secondary | ICD-10-CM | POA: Diagnosis not present

## 2020-11-16 DIAGNOSIS — D2271 Melanocytic nevi of right lower limb, including hip: Secondary | ICD-10-CM | POA: Diagnosis not present

## 2020-11-16 DIAGNOSIS — L570 Actinic keratosis: Secondary | ICD-10-CM | POA: Diagnosis not present

## 2020-11-16 DIAGNOSIS — Z85828 Personal history of other malignant neoplasm of skin: Secondary | ICD-10-CM | POA: Diagnosis not present

## 2020-11-16 DIAGNOSIS — L821 Other seborrheic keratosis: Secondary | ICD-10-CM | POA: Diagnosis not present

## 2020-11-16 DIAGNOSIS — L814 Other melanin hyperpigmentation: Secondary | ICD-10-CM | POA: Diagnosis not present

## 2020-11-16 DIAGNOSIS — D225 Melanocytic nevi of trunk: Secondary | ICD-10-CM | POA: Diagnosis not present

## 2020-11-16 DIAGNOSIS — D1801 Hemangioma of skin and subcutaneous tissue: Secondary | ICD-10-CM | POA: Diagnosis not present

## 2020-12-19 ENCOUNTER — Encounter: Payer: Self-pay | Admitting: Interventional Cardiology

## 2020-12-19 ENCOUNTER — Other Ambulatory Visit: Payer: Self-pay

## 2020-12-19 ENCOUNTER — Ambulatory Visit (INDEPENDENT_AMBULATORY_CARE_PROVIDER_SITE_OTHER): Payer: Medicare Other | Admitting: Interventional Cardiology

## 2020-12-19 VITALS — BP 116/60 | HR 57 | Ht 68.0 in | Wt 152.8 lb

## 2020-12-19 DIAGNOSIS — R0600 Dyspnea, unspecified: Secondary | ICD-10-CM

## 2020-12-19 DIAGNOSIS — E782 Mixed hyperlipidemia: Secondary | ICD-10-CM

## 2020-12-19 DIAGNOSIS — R0609 Other forms of dyspnea: Secondary | ICD-10-CM

## 2020-12-19 NOTE — Patient Instructions (Signed)
Medication Instructions:  Your physician recommends that you continue on your current medications as directed. Please refer to the Current Medication list given to you today.  *If you need a refill on your cardiac medications before your next appointment, please call your pharmacy*   Lab Work: none If you have labs (blood work) drawn today and your tests are completely normal, you will receive your results only by: . MyChart Message (if you have MyChart) OR . A paper copy in the mail If you have any lab test that is abnormal or we need to change your treatment, we will call you to review the results.   Testing/Procedures: none   Follow-Up: At [facility], you and your health needs are our priority.  As part of our continuing mission to provide you with exceptional heart care, we have created designated Provider Care Teams.  These Care Teams include your primary Cardiologist (physician) and Advanced Practice Providers (APPs -  Physician Assistants and Nurse Practitioners) who all work together to provide you with the care you need, when you need it.  We recommend signing up for the patient portal called "MyChart".  Sign up information is provided on this After Visit Summary.  MyChart is used to connect with patients for Virtual Visits (Telemedicine).  Patients are able to view lab/test results, encounter notes, upcoming appointments, etc.  Non-urgent messages can be sent to your provider as well.   To learn more about what you can do with MyChart, go to [email or web address]://[email or web address].com.    Your next appointment:   12 month(s)  The format for your next appointment:   In Person  Provider:   You may see Jayadeep Varanasi, MD or one of the following Advanced Practice Providers on your designated Care Team:    Dayna Dunn, PA-C  Michele Lenze, PA-C    Other Instructions  High-Fiber Eating Plan Fiber, also called dietary fiber, is a type of carbohydrate. It is found foods such as fruits,  vegetables, whole grains, and beans. A high-fiber diet can have many health benefits. Your health care provider may recommend a high-fiber diet to help:  Prevent constipation. Fiber can make your bowel movements more regular.  Lower your cholesterol.  Relieve the following conditions: ? Inflammation of veins in the anus (hemorrhoids). ? Inflammation of specific areas of the digestive tract (uncomplicated diverticulosis). ? A problem of the large intestine, also called the colon, that sometimes causes pain and diarrhea (irritable bowel syndrome, or IBS).  Prevent overeating as part of a weight-loss plan.  Prevent heart disease, type 2 diabetes, and certain cancers. What are tips for following this plan? Reading food labels  Check the nutrition facts label on food products for the amount of dietary fiber. Choose foods that have 5 grams of fiber or more per serving.  The goals for recommended daily fiber intake include: ? Men (age 50 or younger): 34-38 g. ? Men (over age 50): 28-34 g. ? Women (age 50 or younger): 25-28 g. ? Women (over age 50): 22-25 g. Your daily fiber goal is _____________ g.   Shopping  Choose whole fruits and vegetables instead of processed forms, such as apple juice or applesauce.  Choose a wide variety of high-fiber foods such as avocados, lentils, oats, and kidney beans.  Read the nutrition facts label of the foods you choose. Be aware of foods with added fiber. These foods often have high sugar and sodium amounts per serving. Cooking  Use whole-grain flour for baking and cooking.    Cook with brown rice instead of white rice. Meal planning  Start the day with a breakfast that is high in fiber, such as a cereal that contains 5 g of fiber or more per serving.  Eat breads and cereals that are made with whole-grain flour instead of refined flour or white flour.  Eat brown rice, bulgur wheat, or millet instead of white rice.  Use beans in place of meat in  soups, salads, and pasta dishes.  Be sure that half of the grains you eat each day are whole grains. General information  You can get the recommended daily intake of dietary fiber by: ? Eating a variety of fruits, vegetables, grains, nuts, and beans. ? Taking a fiber supplement if you are not able to take in enough fiber in your diet. It is better to get fiber through food than from a supplement.  Gradually increase how much fiber you consume. If you increase your intake of dietary fiber too quickly, you may have bloating, cramping, or gas.  Drink plenty of water to help you digest fiber.  Choose high-fiber snacks, such as berries, raw vegetables, nuts, and popcorn. What foods should I eat? Fruits Berries. Pears. Apples. Oranges. Avocado. Prunes and raisins. Dried figs. Vegetables Sweet potatoes. Spinach. Kale. Artichokes. Cabbage. Broccoli. Cauliflower. Green peas. Carrots. Squash. Grains Whole-grain breads. Multigrain cereal. Oats and oatmeal. Brown rice. Barley. Bulgur wheat. Millet. Quinoa. Bran muffins. Popcorn. Rye wafer crackers. Meats and other proteins Navy beans, kidney beans, and pinto beans. Soybeans. Split peas. Lentils. Nuts and seeds. Dairy Fiber-fortified yogurt. Beverages Fiber-fortified soy milk. Fiber-fortified orange juice. Other foods Fiber bars. The items listed above may not be a complete list of recommended foods and beverages. Contact a dietitian for more information. What foods should I avoid? Fruits Fruit juice. Cooked, strained fruit. Vegetables Fried potatoes. Canned vegetables. Well-cooked vegetables. Grains White bread. Pasta made with refined flour. White rice. Meats and other proteins Fatty cuts of meat. Fried chicken or fried fish. Dairy Milk. Yogurt. Cream cheese. Sour cream. Fats and oils Butters. Beverages Soft drinks. Other foods Cakes and pastries. The items listed above may not be a complete list of foods and beverages to avoid.  Talk with your dietitian about what choices are best for you. Summary  Fiber is a type of carbohydrate. It is found in foods such as fruits, vegetables, whole grains, and beans.  A high-fiber diet has many benefits. It can help to prevent constipation, lower blood cholesterol, aid weight loss, and reduce your risk of heart disease, diabetes, and certain cancers.  Increase your intake of fiber gradually. Increasing fiber too quickly may cause cramping, bloating, and gas. Drink plenty of water while you increase the amount of fiber you consume.  The best sources of fiber include whole fruits and vegetables, whole grains, nuts, seeds, and beans. This information is not intended to replace advice given to you by your health care provider. Make sure you discuss any questions you have with your health care provider. Document Revised: 01/14/2020 Document Reviewed: 01/14/2020 Elsevier Patient Education  2021 Elsevier Inc.   

## 2020-12-19 NOTE — Progress Notes (Signed)
Cardiology Office Note   Date:  12/19/2020   ID:  Brandon Acosta, DOB 08-25-1941, MRN [id number]  PCP:  [name], MD    No chief complaint on file.  DOE  [facility] Readings from Last 3 Encounters:  12/19/20 152 lb 12.8 oz (69.3 kg)  09/05/18 150 lb (68 kg)  05/01/18 152 lb (68.9 kg)       History of Present Illness: Brandon Acosta is a 80 y.o. male who is being seen today for the evaluation of  at the request of Little, Lennette Bihari, MD.  He is a friend of my patient, Dr. Eugenia Pancoast.  He was a patient of Dr. Thurman Coyer in the past.  He had chest pains in 2017 prompting a cath.  2017 ETT was abnormal at peak exercise which led to cath.    Cath in 2018 showed: "  The left ventricular systolic function is normal.  LV end diastolic pressure is normal.  The left ventricular ejection fraction is 55-65% by visual estimate.  There is no aortic valve stenosis.  No significant coronary artery disease.   Continue preventive therapy."  He was treated for esophageal spasm and started on Prilosec for persistent chest pain.  Echo was done which was ok per his report.  Diltiazem was started as well- but did not change anyhting.  He stopped this medicine fairly quickly.   Of late, he report some DOE and mild chest pain, worse when he goes up hill.  He wonders if he needs to retry the diltiazem.      Past Medical History:  Diagnosis Date  . Angina pectoris ([place])   . Arthritis   . Diverticulitis   . GERD (gastroesophageal reflux disease)   . Hyperlipemia   . Hyperlipidemia   . Osteoporosis   . Palpitations     Past Surgical History:  Procedure Laterality Date  . APPENDECTOMY  as child  . CARDIAC CATHETERIZATION N/A 10/16/2016   Procedure: Left Heart Cath and Coronary Angiography;  Surgeon: Jettie Booze, MD;  Location: [facility];  Service: Cardiovascular;  Laterality: N/A;  . COLONOSCOPY WITH PROPOFOL N/A 02/11/2014   Procedure: COLONOSCOPY WITH PROPOFOL;   Surgeon: Cleotis Nipper, MD;  Location: [facility];  Service: Endoscopy;  Laterality: N/A;  . KNEE ARTHROSCOPY Right 2013   torn meniscus repair     Current Outpatient Medications  Medication Sig Dispense Refill  . aspirin 81 MG tablet Take 81 mg by mouth every evening.     . cyclobenzaprine (FLEXERIL) 10 MG tablet Take 1 tablet (10 mg total) by mouth at bedtime. 20 tablet 1  . famotidine (PEPCID) 20 MG tablet Take 20 mg by mouth at bedtime.    Marland Kitchen ibuprofen (ADVIL,MOTRIN) 200 MG tablet Take 400 mg by mouth daily as needed for fever or moderate pain.     Marland Kitchen omeprazole (PRILOSEC) 20 MG capsule Take 20 mg by mouth daily.    Marland Kitchen oxymetazoline (AFRIN) 0.05 % nasal spray Place 1 spray into both nostrils 2 (two) times daily.    . pravastatin (PRAVACHOL) 10 MG tablet 1 tablet    . tamsulosin (FLOMAX) 0.4 MG CAPS capsule Take 0.4 mg by mouth every evening.     No current facility-administered medications for this visit.    Allergies:   Simvastatin    Social History:  The patient  reports that he quit smoking about 44 years ago. His smoking use included cigarettes. He has a 10.00 pack-year smoking history. He has  never used smokeless tobacco. He reports current alcohol use. He reports that he does not use drugs.   Family History:  The patient's family history includes Alcoholism in his mother; Breast cancer in his mother and sister; Cancer - Lung in his father; Cancer - Ovarian in his sister; Heart disease in his mother.    ROS:  Please see the history of present illness.   Otherwise, review of systems are positive for DOE.   All other systems are reviewed and negative.    PHYSICAL EXAM: VS:  BP 116/60   Pulse (!) 57   Ht 5\' 8"  (1.727 m)   Wt 152 lb 12.8 oz (69.3 kg)   SpO2 94%   BMI 23.23 kg/m  , BMI Body mass index is 23.23 kg/m. GEN: Well nourished, well developed, in no acute distress  HEENT: normal  Neck: no JVD, carotid bruits, or masses Cardiac: RRR; no murmurs, rubs, or  gallops,no edema  Respiratory:  clear to auscultation bilaterally, normal work of breathing GI: soft, nontender, nondistended, + BS MS: no deformity or atrophy  Skin: warm and dry, no rash Neuro:  Strength and sensation are intact Psych: euthymic mood, full affect   EKG:   The ekg ordered today demonstrates sinus bradycardia, no ST changes   Recent Labs: No results found for requested labs within last 8760 hours.   Lipid Panel No results found for: CHOL, TRIG, HDL, CHOLHDL, VLDL, LDLCALC, LDLDIRECT   Other studies Reviewed: Additional studies/ records that were reviewed today with results demonstrating: 2016 nuclearstress test- normal.  2017 ETT was abnormal at peak exercise which led to cath.    ASSESSMENT AND PLAN:  1. DOE/chest discomfort: We discussed options including stress testing, trying Imdur, or watchful waiting.  Since he does not feel much different compared to the past few years, will wait.  He remains active.  If something changes, he will let us know.  He really wanted to just get established with a cardiologist at this visit. 2. Hypercholesterolemia: Tolerating pravastatin.  Did not tolerate Vytorin, Crestor, simvastatin, due to leg cramps, but he thinks this may have been Flomax.   LDL 93 in 10/21 3. Decreased meat intake.  Increase plant intake,  lots of fiber rich foods, whole food,  plant-based diet.  Continue preventive therapy.  Monitor symptoms and let us know if there is any change.   Current medicines are reviewed at length with the patient today.  The patient concerns regarding his medicines were addressed.  The following changes have been made:  No change  Labs/ tests ordered today include:  No orders of the defined types were placed in this encounter.   Recommend 150 minutes/week of aerobic exercise Low fat, low carb, high fiber diet recommended  Disposition:   FU in 1 year   Signed, Larae Grooms, MD  12/19/2020 11:23 AM    [facility] [place], [place], [place]  [address] Phone: [phone]; Fax: [phone]

## 2020-12-29 DIAGNOSIS — H0011 Chalazion right upper eyelid: Secondary | ICD-10-CM | POA: Diagnosis not present

## 2020-12-29 DIAGNOSIS — H524 Presbyopia: Secondary | ICD-10-CM | POA: Diagnosis not present

## 2021-01-09 DIAGNOSIS — E7800 Pure hypercholesterolemia, unspecified: Secondary | ICD-10-CM | POA: Diagnosis not present

## 2021-01-09 DIAGNOSIS — R252 Cramp and spasm: Secondary | ICD-10-CM | POA: Diagnosis not present

## 2021-01-09 DIAGNOSIS — N401 Enlarged prostate with lower urinary tract symptoms: Secondary | ICD-10-CM | POA: Diagnosis not present

## 2021-01-13 ENCOUNTER — Ambulatory Visit: Payer: Medicare Other

## 2021-01-17 DIAGNOSIS — Z87440 Personal history of urinary (tract) infections: Secondary | ICD-10-CM | POA: Diagnosis not present

## 2021-01-17 DIAGNOSIS — N138 Other obstructive and reflux uropathy: Secondary | ICD-10-CM | POA: Diagnosis not present

## 2021-01-17 DIAGNOSIS — N401 Enlarged prostate with lower urinary tract symptoms: Secondary | ICD-10-CM | POA: Diagnosis not present

## 2021-01-19 DIAGNOSIS — N401 Enlarged prostate with lower urinary tract symptoms: Secondary | ICD-10-CM | POA: Diagnosis not present

## 2021-01-19 DIAGNOSIS — R252 Cramp and spasm: Secondary | ICD-10-CM | POA: Diagnosis not present

## 2021-01-19 DIAGNOSIS — E7800 Pure hypercholesterolemia, unspecified: Secondary | ICD-10-CM | POA: Diagnosis not present

## 2021-01-23 ENCOUNTER — Other Ambulatory Visit (HOSPITAL_BASED_OUTPATIENT_CLINIC_OR_DEPARTMENT_OTHER): Payer: Self-pay

## 2021-01-23 ENCOUNTER — Ambulatory Visit: Payer: Medicare Other | Attending: Internal Medicine

## 2021-01-23 DIAGNOSIS — Z23 Encounter for immunization: Secondary | ICD-10-CM

## 2021-01-23 MED ORDER — MODERNA COVID-19 VACCINE 100 MCG/0.5ML IM SUSP
INTRAMUSCULAR | 0 refills | Status: DC
  Filled 2021-01-23: qty 0.25, 1d supply, fill #0

## 2021-01-23 NOTE — Progress Notes (Signed)
   Covid-19 Vaccination Clinic  Name:  Brandon Acosta    MRN: [id number] DOB: Jan 28, 1941  01/23/2021  [name] was observed post Covid-19 immunization for 15 minutes without incident. He was provided with Vaccine Information Sheet and instruction to access the V-Safe system.   [name] was instructed to call 911 with any severe reactions post vaccine: Marland Kitchen Difficulty breathing  . Swelling of face and throat  . A fast heartbeat  . A bad rash all over body  . Dizziness and weakness   Immunizations Administered    Name Date Dose VIS Date Route   Moderna Covid-19 Booster Vaccine 01/23/2021  1:58 PM 0.25 mL 07/13/2020 Intramuscular   Manufacturer: Moderna   Lot: [id number]   [place]: [id number]

## 2021-01-24 ENCOUNTER — Other Ambulatory Visit (HOSPITAL_BASED_OUTPATIENT_CLINIC_OR_DEPARTMENT_OTHER): Payer: Self-pay

## 2021-01-31 DIAGNOSIS — N401 Enlarged prostate with lower urinary tract symptoms: Secondary | ICD-10-CM | POA: Diagnosis not present

## 2021-01-31 DIAGNOSIS — N138 Other obstructive and reflux uropathy: Secondary | ICD-10-CM | POA: Diagnosis not present

## 2021-02-07 DIAGNOSIS — I2510 Atherosclerotic heart disease of native coronary artery without angina pectoris: Secondary | ICD-10-CM | POA: Insufficient documentation

## 2021-02-07 DIAGNOSIS — N401 Enlarged prostate with lower urinary tract symptoms: Secondary | ICD-10-CM | POA: Diagnosis not present

## 2021-02-07 DIAGNOSIS — D696 Thrombocytopenia, unspecified: Secondary | ICD-10-CM | POA: Insufficient documentation

## 2021-02-07 DIAGNOSIS — M810 Age-related osteoporosis without current pathological fracture: Secondary | ICD-10-CM | POA: Insufficient documentation

## 2021-02-07 DIAGNOSIS — N138 Other obstructive and reflux uropathy: Secondary | ICD-10-CM | POA: Diagnosis not present

## 2021-02-09 ENCOUNTER — Ambulatory Visit (INDEPENDENT_AMBULATORY_CARE_PROVIDER_SITE_OTHER): Payer: Medicare Other

## 2021-02-09 ENCOUNTER — Ambulatory Visit: Payer: Self-pay

## 2021-02-09 ENCOUNTER — Ambulatory Visit (INDEPENDENT_AMBULATORY_CARE_PROVIDER_SITE_OTHER): Payer: Medicare Other | Admitting: Physician Assistant

## 2021-02-09 ENCOUNTER — Encounter: Payer: Self-pay | Admitting: Physician Assistant

## 2021-02-09 DIAGNOSIS — M1712 Unilateral primary osteoarthritis, left knee: Secondary | ICD-10-CM | POA: Diagnosis not present

## 2021-02-09 DIAGNOSIS — M1711 Unilateral primary osteoarthritis, right knee: Secondary | ICD-10-CM | POA: Diagnosis not present

## 2021-02-09 MED ORDER — METHYLPREDNISOLONE ACETATE 40 MG/ML IJ SUSP
40.0000 mg | INTRAMUSCULAR | Status: AC | PRN
  Administered 2021-02-09: 40 mg via INTRA_ARTICULAR

## 2021-02-09 MED ORDER — LIDOCAINE HCL 1 % IJ SOLN
0.5000 mL | INTRAMUSCULAR | Status: AC | PRN
  Administered 2021-02-09: .5 mL

## 2021-02-09 NOTE — Progress Notes (Signed)
Office Visit Note   Patient: Brandon Acosta           Date of Birth: [birth year]           MRN: [id number] Visit Date: 02/09/2021              Requested by: [name], MD [place],  [place] [address] PCP: [name], MD   Assessment & Plan: Visit Diagnoses:  1. Primary osteoarthritis of right knee   2. Primary osteoarthritis of left knee     Plan: Discussed with patient only quad strengthening exercises and knee friendly exercises.  He knows he needs to wait at least 3 months between cortisone injections both knees.  He follow-up with [place] as needed.  Follow-Up Instructions: Return if symptoms worsen or fail to improve.   Orders:  Orders Placed This Encounter  Procedures  . Large Joint Inj  . XR Knee 1-2 Views Left  . XR Knee 1-2 Views Right   No orders of the defined types were placed in this encounter.     Procedures: Large Joint Inj: bilateral knee on 02/09/2021 4:29 PM Indications: pain Details: 22 G 1.5 in needle, anterolateral approach  Arthrogram: No  Medications (Right): 0.5 mL lidocaine 1 %; 40 mg methylPREDNISolone acetate 40 MG/ML Medications (Left): 0.5 mL lidocaine 1 %; 40 mg methylPREDNISolone acetate 40 MG/ML Outcome: tolerated well, no immediate complications Procedure, treatment alternatives, risks and benefits explained, specific risks discussed. Consent was given by the patient. Immediately prior to procedure a time out was called to verify the correct patient, procedure, equipment, support staff and site/side marked as required. Patient was prepped and draped in the usual sterile fashion.       Clinical Data: No additional findings.   Subjective: Chief Complaint  Patient presents with  . Left Knee - Pain  . Right Knee - Pain    HPI Brandon Acosta 80 year old male well-known to Dr. Creta Levin service comes in today with bilateral knee pain.  He is wanting injections both knees.  Last seen for his right knee in 2020 and had  a cortisone injection in the knee at that time did well.  He has had no pain until the past 3 months that he is having pain in both knees.  He has been taking Advil for pain pain is worse after activity.  He is nondiabetic.  He denies any fevers chills.  Review of Systems  Constitutional: Negative for chills and fever.     Objective: Vital Signs: There were no vitals taken for this visit.  Physical Exam Constitutional:      Appearance: He is normal weight. He is not ill-appearing or diaphoretic.  Pulmonary:     Effort: Pulmonary effort is normal.  Neurological:     Mental Status: He is alert and oriented to person, place, and time.  Psychiatric:        Mood and Affect: Mood normal.     Ortho Exam Bilateral knees he has no abnormal warmth erythema or effusion.  No instability valgus varus stress in either knee.  He has patellofemoral crepitus of both knees with passive range of motion.  McMurray's is negative bilaterally. Specialty Comments:  No specialty comments available.  Imaging: XR Knee 1-2 Views Left  Result Date: 02/09/2021 Left knee 2 views: Mild to moderate patellofemoral changes.  Medial joint line with moderate to severe narrowing.  Lateral compartment overall well-maintained.  No acute fractures knee is well located.  XR Knee 1-2  Views Right  Result Date: 02/09/2021 Right knee 2 views: No acute fractures.  Knee is well located.  Moderate to moderately severe narrowing medial joint line.  Mild to moderate patellofemoral changes.    PMFS History: Patient Active Problem List   Diagnosis Date Noted  . Primary osteoarthritis of right knee 04/28/2019  . Acute lower UTI 09/04/2018  . 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 Hematuria 09/04/2018  . BPH (benign prostatic hyperplasia) 09/04/2018  . Sepsis secondary to UTI ([place]) 09/04/2018  . UTI (urinary tract infection) 09/04/2018  . Abnormal ECG during exercise stress test    Past Medical History:  Diagnosis Date  .  Angina pectoris ([place])   . Arthritis   . Diverticulitis   . GERD (gastroesophageal reflux disease)   . Hyperlipemia   . Hyperlipidemia   . Osteoporosis   . Palpitations     Family History  Problem Relation Age of Onset  . Alcoholism Mother   . Breast cancer Mother   . Heart disease Mother   . Cancer - Lung Father   . Cancer - Ovarian Sister   . Breast cancer Sister     Past Surgical History:  Procedure Laterality Date  . APPENDECTOMY  as child  . CARDIAC CATHETERIZATION N/A 10/16/2016   Procedure: Left Heart Cath and Coronary Angiography;  Surgeon: Jettie Booze, MD;  Location: [facility];  Service: Cardiovascular;  Laterality: N/A;  . COLONOSCOPY WITH PROPOFOL N/A 02/11/2014   Procedure: COLONOSCOPY WITH PROPOFOL;  Surgeon: Cleotis Nipper, MD;  [facility];  Service: Endoscopy;  Laterality: N/A;  . KNEE ARTHROSCOPY Right 2013   torn meniscus repair   Social History   Occupational History  . Not on file  Tobacco Use  . Smoking status: Former Smoker    Packs/day: 0.50    Years: 20.00    Pack years: 10.00    Types: Cigarettes    Quit date: 09/24/1976    Years since quitting: 44.4  . Smokeless tobacco: Never Used  Vaping Use  . Vaping Use: Never used  Substance and Sexual Activity  . Alcohol use: Yes    Comment: occassionally 1 glass wine per day  . Drug use: No  . Sexual activity: Not on file

## 2021-03-15 DIAGNOSIS — N401 Enlarged prostate with lower urinary tract symptoms: Secondary | ICD-10-CM | POA: Diagnosis not present

## 2021-03-15 DIAGNOSIS — N138 Other obstructive and reflux uropathy: Secondary | ICD-10-CM | POA: Diagnosis not present

## 2021-03-15 DIAGNOSIS — N390 Urinary tract infection, site not specified: Secondary | ICD-10-CM | POA: Diagnosis not present

## 2021-03-30 DIAGNOSIS — N400 Enlarged prostate without lower urinary tract symptoms: Secondary | ICD-10-CM | POA: Diagnosis not present

## 2021-03-30 DIAGNOSIS — R338 Other retention of urine: Secondary | ICD-10-CM | POA: Diagnosis not present

## 2021-03-30 DIAGNOSIS — N138 Other obstructive and reflux uropathy: Secondary | ICD-10-CM | POA: Diagnosis not present

## 2021-04-22 ENCOUNTER — Emergency Department (HOSPITAL_BASED_OUTPATIENT_CLINIC_OR_DEPARTMENT_OTHER)
Admission: EM | Admit: 2021-04-22 | Discharge: 2021-04-22 | Disposition: A | Discharge status: Home / Self Care | Payer: Medicare Other | Attending: Emergency Medicine | Admitting: Emergency Medicine

## 2021-04-22 ENCOUNTER — Other Ambulatory Visit: Payer: Self-pay

## 2021-04-22 ENCOUNTER — Encounter (HOSPITAL_BASED_OUTPATIENT_CLINIC_OR_DEPARTMENT_OTHER): Payer: Self-pay | Admitting: Emergency Medicine

## 2021-04-22 DIAGNOSIS — R310 Gross hematuria: Secondary | ICD-10-CM | POA: Diagnosis not present

## 2021-04-22 DIAGNOSIS — Z87891 Personal history of nicotine dependence: Secondary | ICD-10-CM | POA: Diagnosis not present

## 2021-04-22 DIAGNOSIS — Z7982 Long term (current) use of aspirin: Secondary | ICD-10-CM | POA: Insufficient documentation

## 2021-04-22 DIAGNOSIS — Z87440 Personal history of urinary (tract) infections: Secondary | ICD-10-CM | POA: Insufficient documentation

## 2021-04-22 DIAGNOSIS — I10 Essential (primary) hypertension: Secondary | ICD-10-CM | POA: Diagnosis not present

## 2021-04-22 DIAGNOSIS — R82998 Other abnormal findings in urine: Secondary | ICD-10-CM | POA: Diagnosis not present

## 2021-04-22 DIAGNOSIS — R319 Hematuria, unspecified: Secondary | ICD-10-CM | POA: Diagnosis present

## 2021-04-22 DIAGNOSIS — R3915 Urgency of urination: Secondary | ICD-10-CM | POA: Diagnosis not present

## 2021-04-22 LAB — URINALYSIS, ROUTINE W REFLEX MICROSCOPIC
Bilirubin Urine: NEGATIVE
Glucose, UA: NEGATIVE mg/dL
Ketones, ur: NEGATIVE mg/dL
Leukocytes,Ua: NEGATIVE
Nitrite: NEGATIVE
Protein, ur: NEGATIVE mg/dL
Specific Gravity, Urine: <1.005 — ABNORMAL LOW (ref 1.005–1.030)
pH: 6 (ref 5.0–8.0)

## 2021-04-22 LAB — URINALYSIS, MICROSCOPIC (REFLEX)

## 2021-04-22 NOTE — Discharge Instructions (Addendum)
Follow-up with Dr. Venia Minks on Monday to recheck your urine.  Return to emergency room if you have any worsening symptoms over the weekend.

## 2021-04-22 NOTE — ED Provider Notes (Signed)
[facility] EMERGENCY DEPARTMENT Provider Note   CSN: [id number] Arrival date & time: 04/22/21  1958     History Chief Complaint  Patient presents with   Hematuria    Brandon Acosta is a 80 y.o. male.  Patient is a 80 year old male with a history of hypertension hyperlipidemia who presents with hematuria.  He has had a prior history of UTIs and sepsis which in the past have started with hematuria.  He noticed a little bit of blood-tinged urine earlier today.  He has had a follow-up on that with some dark urine.  He said that since he has been here in the ED, his urine does look clear without any evidence of blood.  He denies any associate abdominal pain.  No back pain.  No fevers.  No urinary symptoms other than some urgency.  He has had some urgency in the past due to prostate issues.  No nausea or vomiting.  No chills.      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   Hematuria 09/04/2018   BPH (benign prostatic hyperplasia) 09/04/2018   Sepsis secondary to UTI ([place]) 09/04/2018   UTI (urinary tract infection) 09/04/2018   Abnormal ECG during exercise stress test     Past Surgical History:  Procedure Laterality Date   APPENDECTOMY  as child   CARDIAC CATHETERIZATION N/A 10/16/2016   Procedure: Left Heart Cath and Coronary Angiography;  Surgeon: Jettie Booze, MD;  Location: [facility];  Service: Cardiovascular;  Laterality: N/A;   COLONOSCOPY WITH PROPOFOL N/A 02/11/2014   Procedure: COLONOSCOPY WITH PROPOFOL;  Surgeon: Cleotis Nipper, MD;  Location: [facility];  Service: Endoscopy;  Laterality: N/A;   KNEE  ARTHROSCOPY Right 2013   torn meniscus repair       Family History  Problem Relation Age of Onset   Alcoholism Mother    Breast cancer Mother    Heart disease Mother    Cancer - Lung Father    Cancer - Ovarian Sister    Breast cancer Sister     Social History   Tobacco Use   Smoking status: Former    Packs/day: 0.50    Years: 20.00    Pack years: 10.00    Types: Cigarettes    Quit date: 09/24/1976    Years since quitting: 44.6   Smokeless tobacco: Never  Vaping Use   Vaping Use: Never used  Substance Use Topics   Alcohol use: Yes    Comment: occassionally 1 glass wine per day   Drug use: No    Home Medications Prior to Admission medications   Medication Sig Start Date End Date Taking? Authorizing Provider  aspirin 81 MG tablet Take 81 mg by mouth every evening.     [provider]  COVID-19 mRNA vaccine, Moderna, (MODERNA COVID-19 VACCINE) 100 MCG/0.5ML injection Inject into the muscle. 01/23/21   Carlyle Basques, MD  cyclobenzaprine (FLEXERIL) 10 MG tablet Take 1 tablet (10 mg total) by mouth at bedtime. 10/07/18   Pete Pelt, PA-C  famotidine (PEPCID) 20 MG tablet Take 20 mg by mouth  at bedtime.    [provider]  ibuprofen (ADVIL,MOTRIN) 200 MG tablet Take 400 mg by mouth daily as needed for fever or moderate pain.     [provider]  omeprazole (PRILOSEC) 20 MG capsule Take 20 mg by mouth daily.    [provider]  oxymetazoline (AFRIN) 0.05 % nasal spray Place 1 spray into both nostrils 2 (two) times daily.    [provider]  pravastatin (PRAVACHOL) 10 MG tablet 1 tablet 08/22/20   [provider]  tamsulosin (FLOMAX) 0.4 MG CAPS capsule Take 0.4 mg by mouth every evening. 08/24/16   [provider]    Allergies    Simvastatin  Review of Systems   Review of Systems  Constitutional:  Negative for chills, diaphoresis, fatigue and fever.  HENT:  Negative for congestion, rhinorrhea and sneezing.    Eyes: Negative.   Respiratory:  Negative for cough, chest tightness and shortness of breath.   Cardiovascular:  Negative for chest pain and leg swelling.  Gastrointestinal:  Negative for abdominal pain, blood in stool, diarrhea, nausea and vomiting.  Genitourinary:  Positive for hematuria and urgency. Negative for difficulty urinating, flank pain and frequency.  Musculoskeletal:  Negative for arthralgias and back pain.  Skin:  Negative for rash.  Neurological:  Negative for dizziness, speech difficulty, weakness, numbness and headaches.   Physical Exam Updated Vital Signs BP 110/81   Pulse 97   Temp 98 F (36.7 C) (Oral)   Resp 17   Ht '5\' 8"'$  (1.727 m)   Wt 63.5 kg   SpO2 98%   BMI 21.29 kg/m   Physical Exam Constitutional:      Appearance: He is well-developed.  HENT:     Head: Normocephalic and atraumatic.  Eyes:     Pupils: Pupils are equal, round, and reactive to light.  Cardiovascular:     Rate and Rhythm: Normal rate and regular rhythm.     Heart sounds: Normal heart sounds.  Pulmonary:     Effort: Pulmonary effort is normal. No respiratory distress.     Breath sounds: Normal breath sounds. No wheezing or rales.  Chest:     Chest wall: No tenderness.  Abdominal:     General: Bowel sounds are normal.     Palpations: Abdomen is soft.     Tenderness: There is no abdominal tenderness. There is no guarding or rebound.  Musculoskeletal:        General: Normal range of motion.     Cervical back: Normal range of motion and neck supple.  Lymphadenopathy:     Cervical: No cervical adenopathy.  Skin:    General: Skin is warm and dry.     Findings: No rash.  Neurological:     Mental Status: He is alert and oriented to person, place, and time.    ED Results / Procedures / Treatments   Labs (all labs ordered are listed, but only abnormal results are displayed) Labs Reviewed  URINALYSIS, ROUTINE W REFLEX MICROSCOPIC - Abnormal; Notable for the following components:       Result Value   Color, Urine STRAW (*)    Specific Gravity, Urine <1.005 (*)    Hgb urine dipstick LARGE (*)    All other components within normal limits  URINALYSIS, MICROSCOPIC (REFLEX) - Abnormal; Notable for the following components:   Bacteria, UA FEW (*)    All other components within normal limits    EKG None  Radiology No results found.  Procedures Procedures  Medications Ordered in ED Medications - No data to display  ED Course  I have reviewed the triage vital signs and the nursing notes.  Pertinent labs & imaging results that were available during my care of the patient were reviewed by me and considered in my medical decision making (see chart for details).    MDM Rules/Calculators/A&P                           Patient presents with hematuria.  His urine does not appear to be consistent with infection.  He has no abdominal pain, back pain or other symptoms that would be more concerning for kidney stones.  He is currently asymptomatic.  He was discharged home in good condition.  He was encouraged to follow-up on Monday with his urologist to have his urine rechecked.  Return precautions were given. Final Clinical Impression(s) / ED Diagnoses Final diagnoses:  Gross hematuria    Rx / DC Orders ED Discharge Orders     None        Malvin Johns, MD 04/22/21 2156

## 2021-04-22 NOTE — ED Notes (Signed)
ED Provider at bedside. 

## 2021-04-22 NOTE — ED Triage Notes (Addendum)
Pt reports hematuria that started today; hx of UTIs and sepsis

## 2021-04-27 ENCOUNTER — Other Ambulatory Visit: Payer: Self-pay

## 2021-04-27 ENCOUNTER — Encounter: Payer: Self-pay | Admitting: Physician Assistant

## 2021-04-27 ENCOUNTER — Ambulatory Visit (INDEPENDENT_AMBULATORY_CARE_PROVIDER_SITE_OTHER): Payer: Medicare Other | Admitting: Physician Assistant

## 2021-04-27 ENCOUNTER — Telehealth: Payer: Self-pay

## 2021-04-27 DIAGNOSIS — M1712 Unilateral primary osteoarthritis, left knee: Secondary | ICD-10-CM | POA: Diagnosis not present

## 2021-04-27 DIAGNOSIS — M1711 Unilateral primary osteoarthritis, right knee: Secondary | ICD-10-CM

## 2021-04-27 NOTE — Telephone Encounter (Signed)
Please get auth for bilateral knee gel injection-gil pt 

## 2021-04-27 NOTE — Progress Notes (Signed)
Office Visit Note   Patient: Brandon Acosta           Date of Birth: [birth year]           MRN: [id number] Visit Date: 04/27/2021              Requested by: [name], MD [place],  [place] [id number] PCP: [name], MD   Assessment & Plan: Visit Diagnoses:  1. Primary osteoarthritis of right knee   2. Primary osteoarthritis of left knee     Plan: Given the fact the patient is failed conservative treatment which is included exercise this time cortisone injections.  Recommend supplemental injection for both knees.  We will work on obtaining supplemental injections for his knees and have him back to back upon approval.  In the interim he will use Voltaren gel up to 4 g 4 times daily on both knees.  Also work on quad strengthening he has had atrophy of the bilateral VMO's.  Follow-Up Instructions: Return for Supplemental injection.   Orders:  No orders of the defined types were placed in this encounter.  No orders of the defined types were placed in this encounter.     Procedures: No procedures performed   Clinical Data: No additional findings.   Subjective: Chief Complaint  Patient presents with   Right Knee - Pain, Follow-up   Left Knee - Pain, Follow-up    HPI Brandon Acosta for is well-known to Dr. Creta Levin service has known significant tricompartmental arthritis with near bone-on-bone medial compartment bilaterally.  Moderate to severe patellofemoral changes.  He last underwent a cortisone injection both knees on 02/09/2021.  He is having increased pain in the left knee more so than the right.  No known injury.  Pain is worse after activity during the day describes it as a achy pain.  He is noted some swelling of both knees.  He has difficulty getting up from a sitting position.  Denies any numbness tingling down either leg. Review of Systems See HPI  Objective: Vital Signs: There were no vitals taken for this visit.  Physical  Exam Constitutional:      Appearance: He is not ill-appearing or diaphoretic.  Pulmonary:     Effort: Pulmonary effort is normal.  Neurological:     Mental Status: He is alert and oriented to person, place, and time.    Ortho Exam Bilateral knees good range of motion both knees.  Patellofemoral crepitus left greater than right.  No instability with valgus varus stressing of either knee.  Slight effusion left knee.  No abnormal warmth erythema of either knee.  Ambulates without any assistive device. Specialty Comments:  No specialty comments available.  Imaging: No results found.   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[facility])    Arthritis    Diverticulitis    GERD (gastroesophageal reflux disease)    Hyperlipemia    Hyperlipidemia    Osteoporosis    Palpitations     Family History  Problem Relation Age of Onset   Alcoholism Mother    Breast  cancer Mother    Heart disease Mother    Cancer - Lung Father    Cancer - Ovarian Sister    Breast cancer Sister     Past Surgical History:  Procedure Laterality Date   APPENDECTOMY  as child   CARDIAC CATHETERIZATION N/A 10/16/2016   Procedure: Left Heart Cath and Coronary Angiography;  Surgeon: Jettie Booze, MD;  Location: [facility];  Service: Cardiovascular;  Laterality: N/A;   COLONOSCOPY WITH PROPOFOL N/A 02/11/2014   Procedure: COLONOSCOPY WITH PROPOFOL;  Surgeon: Cleotis Nipper, MD;  Location: [facility];  Service: Endoscopy;  Laterality: N/A;   KNEE ARTHROSCOPY Right 2013   torn meniscus repair   Social  History   Occupational History   Not on file  Tobacco Use   Smoking status: Former    Packs/day: 0.50    Years: 20.00    Pack years: 10.00    Types: Cigarettes    Quit date: 09/24/1976    Years since quitting: 44.6   Smokeless tobacco: Never  Vaping Use   Vaping Use: Never used  Substance and Sexual Activity   Alcohol use: Yes    Comment: occassionally 1 glass wine per day   Drug use: No   Sexual activity: Not on file

## 2021-05-02 ENCOUNTER — Telehealth: Payer: Self-pay

## 2021-05-02 NOTE — Telephone Encounter (Signed)
VOB submitted for SynviscOne, bilateral knee. Pending BV. 

## 2021-05-02 NOTE — Telephone Encounter (Signed)
Noted  

## 2021-05-03 DIAGNOSIS — N401 Enlarged prostate with lower urinary tract symptoms: Secondary | ICD-10-CM | POA: Diagnosis not present

## 2021-05-03 DIAGNOSIS — R310 Gross hematuria: Secondary | ICD-10-CM | POA: Diagnosis not present

## 2021-05-03 DIAGNOSIS — N219 Calculus of lower urinary tract, unspecified: Secondary | ICD-10-CM | POA: Diagnosis not present

## 2021-05-03 DIAGNOSIS — N390 Urinary tract infection, site not specified: Secondary | ICD-10-CM | POA: Diagnosis not present

## 2021-05-03 DIAGNOSIS — N138 Other obstructive and reflux uropathy: Secondary | ICD-10-CM | POA: Diagnosis not present

## 2021-05-03 DIAGNOSIS — R319 Hematuria, unspecified: Secondary | ICD-10-CM | POA: Diagnosis not present

## 2021-05-04 ENCOUNTER — Telehealth: Payer: Self-pay

## 2021-05-04 NOTE — Telephone Encounter (Signed)
Called and left a VM for patient to CB to schedule with Dr. Ninfa Linden or Artis Delay for gel injection.  Approved for SynviscOne, bilateral knee. Buy & Bill Must meet deductible first Patient will be responsible for 20% OOP. No Co-pay No PA required.

## 2021-05-16 ENCOUNTER — Other Ambulatory Visit (HOSPITAL_BASED_OUTPATIENT_CLINIC_OR_DEPARTMENT_OTHER): Payer: Self-pay

## 2021-05-22 ENCOUNTER — Encounter: Payer: Self-pay | Admitting: Orthopaedic Surgery

## 2021-05-22 ENCOUNTER — Ambulatory Visit (INDEPENDENT_AMBULATORY_CARE_PROVIDER_SITE_OTHER): Payer: Medicare Other | Admitting: Orthopaedic Surgery

## 2021-05-22 DIAGNOSIS — M1712 Unilateral primary osteoarthritis, left knee: Secondary | ICD-10-CM

## 2021-05-22 DIAGNOSIS — M1711 Unilateral primary osteoarthritis, right knee: Secondary | ICD-10-CM | POA: Diagnosis not present

## 2021-05-22 MED ORDER — HYLAN G-F 20 48 MG/6ML IX SOSY
48.0000 mg | PREFILLED_SYRINGE | INTRA_ARTICULAR | Status: AC | PRN
  Administered 2021-05-22: 48 mg via INTRA_ARTICULAR

## 2021-05-22 NOTE — Progress Notes (Signed)
   Procedure Note  Patient: Parin Bonam             Date of Birth: [birth year]           MRN: [id number]             Visit Date: 05/22/2021  Procedures: Visit Diagnoses:  1. Primary osteoarthritis of right knee   2. Primary osteoarthritis of left knee     Large Joint Inj: R knee on 05/22/2021 2:53 PM Indications: pain and diagnostic evaluation Details: 22 G 1.5 in needle, superolateral approach  Arthrogram: No  Medications: 48 mg Hylan 48 MG/6ML Outcome: tolerated well, no immediate complications Procedure, treatment alternatives, risks and benefits explained, specific risks discussed. Consent was given by the patient. Immediately prior to procedure a time out was called to verify the correct patient, procedure, equipment, support staff and site/side marked as required. Patient was prepped and draped in the usual sterile fashion.    Large Joint Inj: L knee on 05/22/2021 2:54 PM Indications: pain and diagnostic evaluation Details: 22 G 1.5 in needle, superolateral approach  Arthrogram: No  Medications: 48 mg Hylan 48 MG/6ML Outcome: tolerated well, no immediate complications Procedure, treatment alternatives, risks and benefits explained, specific risks discussed. Consent was given by the patient. Immediately prior to procedure a time out was called to verify the correct patient, procedure, equipment, support staff and site/side marked as required. Patient was prepped and draped in the usual sterile fashion.   The patient comes in today for scheduled hyaluronic acid injections in both knees with Synvisc 1 to treat the pain from osteoarthritis.  He has tried and failed other forms of conservative treatment including steroid injections.  He has moderate arthritis of both his knees.  He has had no significant acute change in medical status other than some blood in his urine is being worked up with an upcoming CT scan and cystoscopy.  Examination of both knees shows no effusion.  I  did place Synvisc 1 in both knees without difficulty.  All questions and concerns were answered and addressed.  Follow-up is as needed.  He understands that we can always do this again in 6 months if needed but usually longer if he is having no issues.

## 2021-05-25 DIAGNOSIS — R310 Gross hematuria: Secondary | ICD-10-CM | POA: Diagnosis not present

## 2021-06-01 DIAGNOSIS — Z23 Encounter for immunization: Secondary | ICD-10-CM | POA: Diagnosis not present

## 2021-06-01 DIAGNOSIS — N401 Enlarged prostate with lower urinary tract symptoms: Secondary | ICD-10-CM | POA: Diagnosis not present

## 2021-06-01 DIAGNOSIS — N201 Calculus of ureter: Secondary | ICD-10-CM | POA: Diagnosis not present

## 2021-06-01 DIAGNOSIS — N202 Calculus of kidney with calculus of ureter: Secondary | ICD-10-CM | POA: Diagnosis not present

## 2021-06-01 DIAGNOSIS — R319 Hematuria, unspecified: Secondary | ICD-10-CM | POA: Diagnosis not present

## 2021-06-01 DIAGNOSIS — N138 Other obstructive and reflux uropathy: Secondary | ICD-10-CM | POA: Diagnosis not present

## 2021-06-01 DIAGNOSIS — R310 Gross hematuria: Secondary | ICD-10-CM | POA: Diagnosis not present

## 2021-06-01 DIAGNOSIS — K8020 Calculus of gallbladder without cholecystitis without obstruction: Secondary | ICD-10-CM | POA: Diagnosis not present

## 2021-06-01 DIAGNOSIS — N390 Urinary tract infection, site not specified: Secondary | ICD-10-CM | POA: Diagnosis not present

## 2021-06-01 DIAGNOSIS — N3289 Other specified disorders of bladder: Secondary | ICD-10-CM | POA: Diagnosis not present

## 2021-06-01 DIAGNOSIS — N200 Calculus of kidney: Secondary | ICD-10-CM | POA: Diagnosis not present

## 2021-06-05 DIAGNOSIS — K5730 Diverticulosis of large intestine without perforation or abscess without bleeding: Secondary | ICD-10-CM | POA: Diagnosis not present

## 2021-06-05 DIAGNOSIS — Z86010 Personal history of colonic polyps: Secondary | ICD-10-CM | POA: Diagnosis not present

## 2021-06-06 DIAGNOSIS — N3289 Other specified disorders of bladder: Secondary | ICD-10-CM | POA: Diagnosis not present

## 2021-06-06 DIAGNOSIS — N201 Calculus of ureter: Secondary | ICD-10-CM | POA: Diagnosis not present

## 2021-06-06 DIAGNOSIS — I491 Atrial premature depolarization: Secondary | ICD-10-CM | POA: Diagnosis not present

## 2021-06-06 DIAGNOSIS — N400 Enlarged prostate without lower urinary tract symptoms: Secondary | ICD-10-CM | POA: Diagnosis not present

## 2021-06-13 DIAGNOSIS — N401 Enlarged prostate with lower urinary tract symptoms: Secondary | ICD-10-CM | POA: Diagnosis not present

## 2021-06-13 DIAGNOSIS — N200 Calculus of kidney: Secondary | ICD-10-CM | POA: Diagnosis not present

## 2021-06-13 DIAGNOSIS — N138 Other obstructive and reflux uropathy: Secondary | ICD-10-CM | POA: Diagnosis not present

## 2021-06-19 DIAGNOSIS — N201 Calculus of ureter: Secondary | ICD-10-CM | POA: Diagnosis not present

## 2021-06-19 DIAGNOSIS — N200 Calculus of kidney: Secondary | ICD-10-CM | POA: Diagnosis not present

## 2021-06-28 DIAGNOSIS — Z23 Encounter for immunization: Secondary | ICD-10-CM | POA: Diagnosis not present

## 2021-07-31 DIAGNOSIS — N138 Other obstructive and reflux uropathy: Secondary | ICD-10-CM | POA: Diagnosis not present

## 2021-07-31 DIAGNOSIS — N200 Calculus of kidney: Secondary | ICD-10-CM | POA: Diagnosis not present

## 2021-07-31 DIAGNOSIS — N401 Enlarged prostate with lower urinary tract symptoms: Secondary | ICD-10-CM | POA: Diagnosis not present

## 2021-08-01 ENCOUNTER — Other Ambulatory Visit: Payer: Self-pay

## 2021-08-01 ENCOUNTER — Ambulatory Visit (INDEPENDENT_AMBULATORY_CARE_PROVIDER_SITE_OTHER): Payer: Medicare Other | Admitting: Family

## 2021-08-01 ENCOUNTER — Encounter: Payer: Self-pay | Admitting: Family

## 2021-08-01 ENCOUNTER — Other Ambulatory Visit: Payer: Self-pay | Admitting: Family

## 2021-08-01 VITALS — BP 108/60 | HR 58 | Temp 97.5°F | Ht 68.0 in | Wt 145.0 lb

## 2021-08-01 DIAGNOSIS — E785 Hyperlipidemia, unspecified: Secondary | ICD-10-CM

## 2021-08-01 DIAGNOSIS — Z789 Other specified health status: Secondary | ICD-10-CM

## 2021-08-01 DIAGNOSIS — M79606 Pain in leg, unspecified: Secondary | ICD-10-CM | POA: Diagnosis not present

## 2021-08-01 DIAGNOSIS — R7303 Prediabetes: Secondary | ICD-10-CM | POA: Diagnosis not present

## 2021-08-01 DIAGNOSIS — R200 Anesthesia of skin: Secondary | ICD-10-CM | POA: Diagnosis not present

## 2021-08-01 DIAGNOSIS — R202 Paresthesia of skin: Secondary | ICD-10-CM

## 2021-08-01 DIAGNOSIS — R9389 Abnormal findings on diagnostic imaging of other specified body structures: Secondary | ICD-10-CM

## 2021-08-01 DIAGNOSIS — R252 Cramp and spasm: Secondary | ICD-10-CM | POA: Diagnosis not present

## 2021-08-01 LAB — LIPID PANEL
Cholesterol: 198 mg/dL (ref 0–200)
HDL: 63 mg/dL (ref >39.00)
LDL Cholesterol: 126 mg/dL — ABNORMAL HIGH (ref 0–99)
NonHDL: 134.78
Total CHOL/HDL Ratio: 3
Triglycerides: 44 mg/dL (ref 0.0–149.0)
VLDL: 8.8 mg/dL (ref 0.0–40.0)

## 2021-08-01 LAB — VITAMIN B12: Vitamin B-12: 323 pg/mL (ref 211–911)

## 2021-08-01 LAB — MAGNESIUM: Magnesium: 1.9 mg/dL (ref 1.5–2.5)

## 2021-08-01 LAB — CK: Total CK: 85 U/L (ref 7–232)

## 2021-08-01 LAB — HEMOGLOBIN A1C: Hgb A1c MFr Bld: 5.8 % (ref 4.6–6.5)

## 2021-08-01 NOTE — Progress Notes (Signed)
Brandon Acosta is a 80 y.o. 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     ibuprofen (ADVIL,MOTRIN) 200 MG tablet Take 400 mg by mouth daily as needed for fever or moderate pain.      omeprazole (PRILOSEC) 20 MG capsule Take 20 mg by mouth daily.     oxymetazoline (AFRIN) 0.05 % nasal spray Place 1 spray into both nostrils 2 (two) times daily.     tamsulosin (FLOMAX) 0.4 MG CAPS capsule Take 0.4 mg by mouth every evening.     No current facility-administered medications for this visit.    Allergies: Simvastatin  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[place])    Arthritis    Diverticulitis    GERD (gastroesophageal reflux disease)    Hyperlipemia    Hyperlipidemia    Osteoporosis    Palpitations     Past Surgical History:  Procedure Laterality Date   APPENDECTOMY  as child   CARDIAC CATHETERIZATION N/A 10/16/2016   Procedure: Left Heart Cath and Coronary Angiography;  Surgeon: Jettie Booze, MD;  Location: [facility];  Service: Cardiovascular;  Laterality: N/A;   COLONOSCOPY WITH PROPOFOL N/A 02/11/2014   Procedure: COLONOSCOPY WITH PROPOFOL;  Surgeon: Cleotis Nipper, MD;  Location: [facility];  Service: Endoscopy;  Laterality: N/A;   KNEE ARTHROSCOPY Right 2013   torn meniscus repair    Family History  Problem Relation Age of Onset   Alcoholism Mother    Breast cancer Mother    Heart disease Mother    Cancer - Lung Father     Cancer - Ovarian Sister    Breast cancer Sister     Social History   Tobacco Use   Smoking status: Former    Packs/day: 0.50    Years: 20.00    Pack years: 10.00    Types: Cigarettes    Quit date: 09/24/1976    Years since quitting: 44.8   Smokeless tobacco: Never  Substance Use Topics   Alcohol use: Yes    Comment: occassionally 1 glass wine per day    Subjective:   Presents today as a new patient today; would like to discuss statins;  History of statin intolerance; has discussed with cardiology earlier in the year; would like to get updated fasting labs today; is actually planning to follow up with cardiology again to discuss atypical chest pain that has been present for past 5 years;   cardiac cath done in 2018; echo also done in 2018; was essentially normal; 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  Head: Normocephalic and atraumatic  Lungs: Respirations unlabored; clear to auscultation bilaterally without wheeze, rales, rhonchi  CVS exam: normal rate and regular rhythm.  Neurologic: Alert and oriented; speech intact; face symmetrical; moves all extremities well; CNII-XII intact without focal deficit   Assessment:  1. Hyperlipidemia, unspecified hyperlipidemia type   2. Pre-diabetes   3. Muscle cramps   4. Pain of lower extremity, unspecified laterality   5. Numbness and tingling of both lower extremities   6. Abnormal CT scan     Plan:  Update lipid panel today; to consider lipid clinic consult; ? If patient would benefit from Zetia and/or PCSK 9; he also plan to  schedule follow up with his cardiologist; follow up to be determined; Check Hgba1c today; Check CK- symptoms have improve considerably since stopping statin; & 5. Recent CBC, CMP done with urology were unremarkable; check magnesium/ B12 level today; 6.   Reviewed CT done by urology in September; radiologist recommending repeat CT to evaluate area in lungs in 3 months; will plan to get this scheduled in December 2022; follow up to be determined;  Congratulated patient on his commitment to health;   This visit occurred during the SARS-CoV-2 public health emergency.  Safety protocols were in place, including screening questions prior to the visit, additional usage of staff PPE, and extensive cleaning of exam room while observing appropriate contact time as indicated for disinfecting solutions.    No follow-ups on file.  Orders Placed This Encounter  Procedures   Lipid panel   Hemoglobin A1c   CK (Creatine Kinase)   Magnesium   B12    Requested Prescriptions    No prescriptions requested or ordered in this encounter

## 2021-08-01 NOTE — Patient Instructions (Signed)
Please call your cardiologist as we discussed; I will be in touch to get the chest CT scheduled in December as we discussed.

## 2021-08-03 ENCOUNTER — Other Ambulatory Visit: Payer: Self-pay | Admitting: Family

## 2021-08-03 ENCOUNTER — Encounter: Payer: Self-pay | Admitting: Family

## 2021-08-03 DIAGNOSIS — R0782 Intercostal pain: Secondary | ICD-10-CM

## 2021-08-15 DIAGNOSIS — N200 Calculus of kidney: Secondary | ICD-10-CM | POA: Diagnosis not present

## 2021-08-22 DIAGNOSIS — N138 Other obstructive and reflux uropathy: Secondary | ICD-10-CM | POA: Diagnosis not present

## 2021-08-22 DIAGNOSIS — N200 Calculus of kidney: Secondary | ICD-10-CM | POA: Diagnosis not present

## 2021-08-22 DIAGNOSIS — N401 Enlarged prostate with lower urinary tract symptoms: Secondary | ICD-10-CM | POA: Diagnosis not present

## 2021-08-22 DIAGNOSIS — N390 Urinary tract infection, site not specified: Secondary | ICD-10-CM | POA: Diagnosis not present

## 2021-08-24 DIAGNOSIS — N138 Other obstructive and reflux uropathy: Secondary | ICD-10-CM | POA: Diagnosis not present

## 2021-08-24 DIAGNOSIS — N401 Enlarged prostate with lower urinary tract symptoms: Secondary | ICD-10-CM | POA: Diagnosis not present

## 2021-08-25 ENCOUNTER — Ambulatory Visit
Admission: RE | Admit: 2021-08-25 | Discharge: 2021-08-25 | Disposition: A | Discharge status: Home / Self Care | Payer: Medicare Other | Source: Ambulatory Visit | Attending: Family | Admitting: Family

## 2021-08-25 ENCOUNTER — Other Ambulatory Visit: Payer: Self-pay

## 2021-08-25 DIAGNOSIS — I700 Atherosclerosis of aorta: Secondary | ICD-10-CM | POA: Diagnosis not present

## 2021-08-25 DIAGNOSIS — R0782 Intercostal pain: Secondary | ICD-10-CM

## 2021-08-25 DIAGNOSIS — R079 Chest pain, unspecified: Secondary | ICD-10-CM | POA: Diagnosis not present

## 2021-08-28 ENCOUNTER — Encounter: Payer: Self-pay | Admitting: Family

## 2021-08-29 ENCOUNTER — Other Ambulatory Visit: Payer: Self-pay | Admitting: Family

## 2021-08-29 DIAGNOSIS — L570 Actinic keratosis: Secondary | ICD-10-CM | POA: Diagnosis not present

## 2021-08-29 DIAGNOSIS — R918 Other nonspecific abnormal finding of lung field: Secondary | ICD-10-CM

## 2021-08-29 DIAGNOSIS — Z85828 Personal history of other malignant neoplasm of skin: Secondary | ICD-10-CM | POA: Diagnosis not present

## 2021-09-01 ENCOUNTER — Other Ambulatory Visit: Payer: Self-pay

## 2021-09-01 ENCOUNTER — Encounter: Payer: Self-pay | Admitting: Pulmonary Disease

## 2021-09-01 ENCOUNTER — Ambulatory Visit (INDEPENDENT_AMBULATORY_CARE_PROVIDER_SITE_OTHER): Payer: Medicare Other | Admitting: Pulmonary Disease

## 2021-09-01 VITALS — BP 116/68 | HR 56 | Ht 68.0 in | Wt 145.6 lb

## 2021-09-01 DIAGNOSIS — R9389 Abnormal findings on diagnostic imaging of other specified body structures: Secondary | ICD-10-CM

## 2021-09-01 NOTE — Progress Notes (Signed)
Synopsis: Referred in December 2022 for Abnormal CT Lung by Marvis Repress, FNP  Subjective:   PATIENT ID: Brandon Acosta GENDER: male DOB: 1941/07/26, MRN: [id number]  HPI  Chief Complaint  Patient presents with   Consult    Referred by PCP for abnormal CT last week. Denies any breathing issues.    Brandon Acosta is a 81 year old male, former smoker with GERD and BPH who is referred to pulmonary clinic for abnormal CT Chest scan.  Patient reported having hematuria last month in which a CT abdomen pelvis was performed noting renal stones.  The CT abdomen was also notable for areas of clustered micro nodularity of the right middle lobe in which a CT chest scan was ordered by his primary care team for further evaluation of these abnormalities.  The CT chest scan again showed mild clustered micro nodularity in the right middle lobe and inferior right upper lobe similar to recent abdominal CT imaging.  There is also a small granuloma in the right upper lobe.  Patient reports hematuria has resolved and there were no abnormal findings on cystoscopy except for nephrolithiasis.  Patient denies any respiratory symptoms of cough, wheezing, shortness of breath, sputum production or chest discomfort.  He denies any fevers, chills, night sweats loss of appetite or weight loss.  Patient is a former smoker and quit in 1978.  He is currently retired denies any harmful dust or chemical exposures throughout his occupational history.  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HCC)    Arthritis    Diverticulitis    GERD (gastroesophageal reflux disease)    Hyperlipemia    Hyperlipidemia    Osteoporosis    Palpitations      Family History  Problem Relation Age of Onset   Alcoholism Mother    Breast cancer Mother    Heart disease Mother    Cancer - Lung Father    Cancer - Ovarian Sister    Breast cancer Sister      Social History   Socioeconomic History   Marital  status: Married    Spouse name: Not on file   Number of children: Not on file   Years of education: Not on file   Highest education level: Not on file  Occupational History   Not on file  Tobacco Use   Smoking status: Former    Packs/day: 0.50    Years: 20.00    Pack years: 10.00    Types: Cigarettes    Quit date: 09/24/1976    Years since quitting: 44.9   Smokeless tobacco: Never  Vaping Use   Vaping Use: Never used  Substance and Sexual Activity   Alcohol use: Yes    Comment: occassionally 1 glass wine per day   Drug use: No   Sexual activity: Not on file  Other Topics Concern   Not on file  Social History Narrative   Lives with wife   Caffeine use: Tea daily   Right handed    Social Determinants of Health   Financial Resource Strain: Not on file  Food Insecurity: Not on file  Transportation Needs: Not on file  Physical Activity: Not on file  Stress: Not on file  Social Connections: Not on file  Intimate Partner Violence: Not on file     Allergies  Allergen Reactions   Simvastatin Other (See Comments)     Outpatient Medications Prior to Visit  Medication Sig Dispense Refill   Cyanocobalamin (VITAMIN B  12 PO) Take by mouth daily.     finasteride (PROSCAR) 5 MG tablet Take 1 tablet by mouth daily.     ibuprofen (ADVIL,MOTRIN) 200 MG tablet Take 400 mg by mouth daily as needed for fever or moderate pain.      omeprazole (PRILOSEC) 20 MG capsule Take 20 mg by mouth daily.     oxymetazoline (AFRIN) 0.05 % nasal spray Place 1 spray into both nostrils 2 (two) times daily.     tamsulosin (FLOMAX) 0.4 MG CAPS capsule Take 0.4 mg by mouth every evening.     No facility-administered medications prior to visit.    Review of Systems  Constitutional:  Negative for chills, fever, malaise/fatigue and weight loss.  HENT:  Negative for congestion, sinus pain and sore throat.   Eyes: Negative.   Respiratory:  Negative for cough, hemoptysis, sputum production, shortness of  breath and wheezing.   Cardiovascular:  Negative for chest pain, palpitations, orthopnea, claudication and leg swelling.  Gastrointestinal:  Negative for abdominal pain, heartburn, nausea and vomiting.  Genitourinary: Negative.   Musculoskeletal:  Negative for joint pain and myalgias.  Skin:  Negative for rash.  Neurological:  Negative for weakness.  Endo/Heme/Allergies: Negative.   Psychiatric/Behavioral: Negative.       Objective:   Vitals:   09/01/21 1355  BP: 116/68  Pulse: (!) 56  SpO2: 97%  Weight: 145 lb 9.6 oz (66 kg)  Height: 5\' 8"  (1.727 m)     Physical Exam Constitutional:      General: He is not in acute distress. HENT:     Head: Normocephalic and atraumatic.  Eyes:     Extraocular Movements: Extraocular movements intact.     Conjunctiva/sclera: Conjunctivae normal.     Pupils: Pupils are equal, round, and reactive to light.  Cardiovascular:     Rate and Rhythm: Normal rate and regular rhythm.     Pulses: Normal pulses.     Heart sounds: Normal heart sounds. No murmur heard. Pulmonary:     Effort: Pulmonary effort is normal.     Breath sounds: Normal breath sounds.  Abdominal:     General: Bowel sounds are normal.     Palpations: Abdomen is soft.  Musculoskeletal:     Right lower leg: No edema.     Left lower leg: No edema.  Lymphadenopathy:     Cervical: No cervical adenopathy.  Skin:    General: Skin is warm and dry.  Neurological:     General: No focal deficit present.     Mental Status: He is alert.  Psychiatric:        Mood and Affect: Mood normal.        Behavior: Behavior normal.        Thought Content: Thought content normal.        Judgment: Judgment normal.   CBC    Component Value Date/Time   WBC 4.6 09/07/2018 0758   RBC 3.12 (L) 09/07/2018 0758   HGB 10.5 (L) 09/07/2018 0758   HCT 31.5 (L) 09/07/2018 0758   PLT 123 (L) 09/07/2018 0758   MCV 101.0 (H) 09/07/2018 0758   MCH 33.7 09/07/2018 0758   MCHC 33.3 09/07/2018 0758    RDW 13.6 09/07/2018 0758   LYMPHSABS 0.2 (L) 09/04/2018 1543   MONOABS 0.4 09/04/2018 1543   EOSABS 0.0 09/04/2018 1543   BASOSABS 0.0 09/04/2018 1543   BMP Latest Ref Rng & Units 09/07/2018 09/06/2018 09/05/2018  Glucose 70 - 99 mg/dL 100(H) 101(H) 120(H)  BUN 8 - 23 mg/dL 12 18 24(H)  Creatinine 0.61 - 1.24 mg/dL 0.79 0.87 0.83  Sodium 135 - 145 mmol/L 136 135 136  Potassium 3.5 - 5.1 mmol/L 3.4(L) 3.8 4.7  Chloride 98 - 111 mmol/L 107 108 112(H)  CO2 22 - 32 mmol/L 22 19(L) 18(L)  Calcium 8.9 - 10.3 mg/dL 7.8(L) 8.0(L) 7.9(L)   Chest imaging: CT Chest Scan 08/25/21 No acute upper abdominal findings.   Mild clustered micro nodularity in the RIGHT middle lobe and inferior RIGHT upper lobe, similar to recent abdominal CT imaging. Findings are nonspecific but can be seen in the setting of atypical infection such as MAI.   Cholelithiasis.   Nephrolithiasis with a 3-4 mm calculus in the upper pole the LEFT kidney.   Aortic atherosclerosis.   Aortic Atherosclerosis (ICD10-I70.0).   PFT: No flowsheet data found.  Labs:  Path:  Echo:  Heart Catheterization:  Assessment & Plan:   Abnormal CT of the chest - Plan: CT Chest Wo Contrast  Discussion: Brandon Acosta is a 80 year old male, former smoker with GERD and BPH who is referred to pulmonary clinic for abnormal CT Chest scan.  Patient has very mild areas of scattered micro nodularity of the right lung which may represent indolent mycobacterial infection or other atypical infection.  Patient is asymptomatic at this time and does not warrant further work-up.  He is not considered infectious and does not need to be concerned about being around other people.  I discussed with the patient that if he notices increasing dry cough or cough with sputum production, shortness of breath, wheezing, chest discomfort, weight loss, fever, chills, night sweats to notify our office for further evaluation.  Otherwise we will plan to  see the patient in 1 year with follow-up CT chest scan.  Freda Jackson, MD [facility] Office: [phone]   Current Outpatient Medications:    Cyanocobalamin (VITAMIN B 12 PO), Take by mouth daily., Disp: , Rfl:    finasteride (PROSCAR) 5 MG tablet, Take 1 tablet by mouth daily., Disp: , Rfl:    ibuprofen (ADVIL,MOTRIN) 200 MG tablet, Take 400 mg by mouth daily as needed for fever or moderate pain. , Disp: , Rfl:    omeprazole (PRILOSEC) 20 MG capsule, Take 20 mg by mouth daily., Disp: , Rfl:    oxymetazoline (AFRIN) 0.05 % nasal spray, Place 1 spray into both nostrils 2 (two) times daily., Disp: , Rfl:    tamsulosin (FLOMAX) 0.4 MG CAPS capsule, Take 0.4 mg by mouth every evening., Disp: , Rfl:

## 2021-09-01 NOTE — Patient Instructions (Addendum)
These infiltrates could be related to mycobacterium avium complex. This is a slow growing organism that may or may not lead to further inflammation of the airways.   We will follow up a CT Chest scan in 1 year to follow up on the areas of mild infiltrates.   Please contact the clinic if you develop dry cough, mucous production, fevers, chills or weight loss.

## 2021-09-02 ENCOUNTER — Encounter: Payer: Self-pay | Admitting: Pulmonary Disease

## 2021-09-12 ENCOUNTER — Telehealth: Payer: Self-pay | Admitting: Interventional Cardiology

## 2021-09-12 DIAGNOSIS — Z01812 Encounter for preprocedural laboratory examination: Secondary | ICD-10-CM | POA: Diagnosis not present

## 2021-09-12 DIAGNOSIS — N401 Enlarged prostate with lower urinary tract symptoms: Secondary | ICD-10-CM | POA: Diagnosis not present

## 2021-09-12 DIAGNOSIS — I4891 Unspecified atrial fibrillation: Secondary | ICD-10-CM | POA: Diagnosis not present

## 2021-09-12 DIAGNOSIS — Z01810 Encounter for preprocedural cardiovascular examination: Secondary | ICD-10-CM | POA: Diagnosis not present

## 2021-09-12 DIAGNOSIS — N138 Other obstructive and reflux uropathy: Secondary | ICD-10-CM | POA: Diagnosis not present

## 2021-09-12 NOTE — Telephone Encounter (Signed)
° °  Pre-operative Risk Assessment    Patient Name: Brandon Acosta  DOB: [birth year] MRN: [id number]      Request for Surgical Clearance    Procedure:  Urolift  Date of Surgery:  09-14-21 Clearance                                  Surgeon:  Dr  Rosario Adie Surgeon's Group or Practice Name:   Phone number:  [phone] Fax number:  [phone]   Type of Clearance Requested:   Medical   Type of Anesthesia:  Local   Additional requests/questions:    Signed, Glyn Ade   09/12/2021, 2:36 PM

## 2021-09-12 NOTE — Telephone Encounter (Signed)
Spoke with patient and offered multiple appointments. Patient declined and stated he only wanted to see Dr Irish Lack. I advised patient that if he is having chest pain he should be seen sooner. He declined and stated his pain was not that bad.   Follow up appt schedule with Dr Irish Lack on 09/28/21 at 11:30 am. Patient agreeable and voiced understanding.

## 2021-09-12 NOTE — Telephone Encounter (Signed)
Pt with cardiac symptoms of chest tightness and palpitations during pre-op phone screening. Pt needs office visit as soon as possible (requests Dr. Irish Lack) to discuss/further evaluate symptoms. Could consider double booking if schedule if full to expedite appointment.   Lenna Sciara, NP

## 2021-09-13 ENCOUNTER — Other Ambulatory Visit: Payer: Self-pay | Admitting: Family

## 2021-09-13 DIAGNOSIS — I4891 Unspecified atrial fibrillation: Secondary | ICD-10-CM

## 2021-09-13 NOTE — Telephone Encounter (Signed)
Preoperative cardiac team, please add this patient to the callback list.  Please offer him a sooner appointment if available for preoperative cardiac evaluation.  Thank you for your help.  Patient has an appointment scheduled with Dr.Varanasi 09/28/2021.  Jossie Ng. Sherine Cortese NP-C    09/13/2021, 3:33 PM [address] Office [phone] Fax [phone]

## 2021-09-13 NOTE — Telephone Encounter (Signed)
Spoke with Gwen from [place] [facility] who states the patients pcp want to the patient to be seen tomorrow (09/14/21) or Friday (09/15/21) due to his chest pain.   She states the patients pcp called the patient and informed him that is urgent that he be seen. She states the patient is agreeable with a sooner appt.   Appointment booked with Dr Marlou Porch' for Friday 09/15/21 at 4:30.   I contacted patient and made him aware; he is agreeable and voiced understanding.

## 2021-09-13 NOTE — Telephone Encounter (Addendum)
Patient's PCP office called. The PCP office was hoping the patient could get seen for Surgical Clearance ASAP. The PCP office feels that the appt 09/28/21 was not soon enough. Call was transferred to [facility]

## 2021-09-14 ENCOUNTER — Encounter (HOSPITAL_BASED_OUTPATIENT_CLINIC_OR_DEPARTMENT_OTHER): Payer: Self-pay

## 2021-09-14 ENCOUNTER — Emergency Department (HOSPITAL_BASED_OUTPATIENT_CLINIC_OR_DEPARTMENT_OTHER): Payer: Medicare Other

## 2021-09-14 ENCOUNTER — Emergency Department (HOSPITAL_BASED_OUTPATIENT_CLINIC_OR_DEPARTMENT_OTHER)
Admission: EM | Admit: 2021-09-14 | Discharge: 2021-09-14 | Disposition: A | Discharge status: Home / Self Care | Payer: Medicare Other | Attending: Emergency Medicine | Admitting: Emergency Medicine

## 2021-09-14 ENCOUNTER — Telehealth: Payer: Self-pay | Admitting: Family

## 2021-09-14 DIAGNOSIS — R0789 Other chest pain: Secondary | ICD-10-CM | POA: Diagnosis not present

## 2021-09-14 DIAGNOSIS — Z7982 Long term (current) use of aspirin: Secondary | ICD-10-CM | POA: Diagnosis not present

## 2021-09-14 DIAGNOSIS — Z87891 Personal history of nicotine dependence: Secondary | ICD-10-CM | POA: Insufficient documentation

## 2021-09-14 DIAGNOSIS — R079 Chest pain, unspecified: Secondary | ICD-10-CM | POA: Diagnosis not present

## 2021-09-14 DIAGNOSIS — Z79899 Other long term (current) drug therapy: Secondary | ICD-10-CM | POA: Diagnosis not present

## 2021-09-14 DIAGNOSIS — N200 Calculus of kidney: Secondary | ICD-10-CM | POA: Diagnosis not present

## 2021-09-14 LAB — CBC WITH DIFFERENTIAL/PLATELET
Abs Immature Granulocytes: 0.02 10*3/uL (ref 0.00–0.07)
Basophils Absolute: 0 10*3/uL (ref 0.0–0.1)
Basophils Relative: 0 %
Eosinophils Absolute: 0 10*3/uL (ref 0.0–0.5)
Eosinophils Relative: 0 %
HCT: 40.8 % (ref 39.0–52.0)
Hemoglobin: 13.9 g/dL (ref 13.0–17.0)
Immature Granulocytes: 0 %
Lymphocytes Relative: 15 %
Lymphs Abs: 1.1 10*3/uL (ref 0.7–4.0)
MCH: 33.6 pg (ref 26.0–34.0)
MCHC: 34.1 g/dL (ref 30.0–36.0)
MCV: 98.6 fL (ref 80.0–100.0)
Monocytes Absolute: 0.6 10*3/uL (ref 0.1–1.0)
Monocytes Relative: 9 %
Neutro Abs: 5.3 10*3/uL (ref 1.7–7.7)
Neutrophils Relative %: 76 %
Platelets: 243 10*3/uL (ref 150–400)
RBC: 4.14 MIL/uL — ABNORMAL LOW (ref 4.22–5.81)
RDW: 13 % (ref 11.5–15.5)
WBC: 7 10*3/uL (ref 4.0–10.5)
nRBC: 0 % (ref 0.0–0.2)

## 2021-09-14 LAB — TSH: TSH: 1.305 u[IU]/mL (ref 0.350–4.500)

## 2021-09-14 LAB — TROPONIN I (HIGH SENSITIVITY)
Troponin I (High Sensitivity): 3 ng/L (ref <18)
Troponin I (High Sensitivity): 3 ng/L (ref <18)

## 2021-09-14 LAB — BASIC METABOLIC PANEL
Anion gap: 6 (ref 5–15)
BUN: 18 mg/dL (ref 8–23)
CO2: 28 mmol/L (ref 22–32)
Calcium: 8.9 mg/dL (ref 8.9–10.3)
Chloride: 102 mmol/L (ref 98–111)
Creatinine, Ser: 0.75 mg/dL (ref 0.61–1.24)
GFR, Estimated: >60 mL/min (ref >60)
Glucose, Bld: 125 mg/dL — ABNORMAL HIGH (ref 70–99)
Potassium: 3.8 mmol/L (ref 3.5–5.1)
Sodium: 136 mmol/L (ref 135–145)

## 2021-09-14 LAB — MAGNESIUM: Magnesium: 1.8 mg/dL (ref 1.7–2.4)

## 2021-09-14 LAB — D-DIMER, QUANTITATIVE: D-Dimer, Quant: 0.65 ug/mL-FEU — ABNORMAL HIGH (ref 0.00–0.50)

## 2021-09-14 MED ORDER — IOHEXOL 350 MG/ML SOLN
75.0000 mL | Freq: Once | INTRAVENOUS | Status: AC | PRN
  Administered 2021-09-14: 14:00:00 75 mL via INTRAVENOUS

## 2021-09-14 NOTE — Telephone Encounter (Signed)
I have the pt and he is at the ED and getting a work up. He feels like he is at the right place and he will give [place] a update later on.

## 2021-09-14 NOTE — Discharge Instructions (Addendum)
Please follow-up with your cardiologist tomorrow as scheduled.

## 2021-09-14 NOTE — ED Triage Notes (Signed)
Pt c/o intermittent chest pain for the past month. Pt states states the pain was more severe this morning and feels like his heart rhythm is irregular. Pt was told on Tuesday that he has Afib which was found accidentally during a preop this past Tuesday. Wife gave asa, xanax, and 1 dose of nitro pta. Pt has an appointment with the cardiologist next week.

## 2021-09-14 NOTE — Telephone Encounter (Signed)
Pt currently in ER for evaluation/treatment.   [facility] [place] RECORDAccessNurse Patient Name:Brandon Acosta [facility] Initial Comment Caller states her husband was supposed to have a procedure today, but he had an irregular EKG, which was afib. He is having tightness of chest and 93 pulseox. He isn't SOB and his BP is fine, but he doesn't feel right. Translation No Nurse Assessment Nurse: Hassell Done, RN, Melanie Date/Time Eilene Ghazi Time): 09/14/2021 12:02:16 PM Confirm and document reason for call. If symptomatic, describe symptoms. ---Caller states her H was supposed to have uro lift today and had an abnormal EKG so was told he was in a fib. Had Tightness in his chest and was tight in his throat and was given an expired nitro. Does the patient have any new or worsening symptoms? ---Yes Will a triage be completed? ---Yes Related visit to physician within the last 2 weeks? ---No Does the PT have any chronic conditions? (i.e. diabetes, asthma, this includes High risk factors for pregnancy, etc.) ---No Is this a behavioral health or substance abuse call? ---No Guidelines Guideline Title Affirmed Question Affirmed Notes Nurse Date/Time Eilene Ghazi Time) Chest Pain [1] Chest pain lasts > 5 minutes AND [2] age > 70 Hassell Done, RN, Threasa Beards 09/14/2021 12:05:16 PM Disp. Time Eilene Ghazi Time) Disposition Final User 09/14/2021 11:56:28 AM Send to Urgent Queue JOHNS, MATHEW PLEASE NOTE: All timestamps contained within this report are represented as [place] Standard Time. CONFIDENTIALTY NOTICE: This fax transmission is intended only for the addressee. It contains information that is legally privileged, confidential or otherwise protected from use or disclosure. If you are not the intended recipient, you are strictly prohibited from reviewing, disclosing, copying using or disseminating any of this information or taking any action in reliance on or regarding this  information. If you have received this fax in error, please notify us immediately by telephone so that we can arrange for its return to [place]. Phone: [phone], Toll-Free: [phone], Fax: [phone] Page: 2 of 2 Call Id: [id number] [place]. Time Eilene Ghazi Time) Disposition Final User 09/14/2021 12:08:05 PM 911 Outcome Documentation Hassell Done, RN, Threasa Beards Reason: Caller said she was going to take her H to the ER 09/14/2021 12:07:28 PM Call EMS 911 Now Yes Hassell Done, RN, Threasa Beards Caller Disagree/Comply Comply Caller Understands Yes PreDisposition Go to ED Care Advice Given Per Guideline CALL EMS 911 NOW: * Immediate medical attention is needed. You need to hang up and call 911 (or an ambulance). NOTE TO TRIAGER - IF CALLER ASKS ABOUT ASPIRIN: CARE ADVICE given per Chest Pain (Adult) guideline. Referrals [facility] - ED

## 2021-09-14 NOTE — Telephone Encounter (Signed)
Pt's wife stated he had a procedure that was cancelled today due to an ekg showing he was afib. His pulse is irregular and oxygen level is 93, is not short of breath but does have tightness in his chest. She stated bp was okay. He took a baby asprin and was lying down currently. She was transferred to triage, please advise.

## 2021-09-14 NOTE — ED Provider Notes (Signed)
[facility] EMERGENCY DEPARTMENT Provider Note   CSN: [id number] Arrival date & time: 09/14/21  1230     History Chief Complaint  Patient presents with   Chest Pain    Brandon Acosta is a 80 y.o. male presenting to the emergency department with complaint of chest pain.  Patient reports that he has had persistent episodes of chest pain, on and off, for the past several years.  He has had several work-ups done including with cardiology as an outpatient for this, no clear cause of his symptoms identified.  This is included a LHC in Jan 2018 with Dr Irish Lack which showed "no significant coronary disease."  He presents today in the company of his wife and daughter with concern for a persistent episode of chest pain that began this morning, with chest tightness across his mid chest.  His wife gave him a nitroglycerin tablet, which was about 4 years expired, and he also took a baby aspirin and his Xanax.  He thinks that collectively his medications helped ease up his symptoms.  He was concerned because he typically has low blood pressure, and did not want to take more nitroglycerin.  They report that he does have a cardiology appointment tomorrow.  He was being seen for preoperative clearance at [facility] 2 days ago for a urological procedure, and was told that he had "new A. fib" on his EKG and that they delayed therefore his operation.  He is not aware of any prior history of atrial fibrillation.  He denies any known cardiac history himself.  He is not a smoker, he does have high cholesterol.  He does not have diabetes or high blood pressure.  He is typically extremely active.   He was also seen by pulmonology for incidental nodules noted on the CT scan.  He had a repeat CT scan without contrast performed in December, which showed some clustered micronodules in the right middle lobe.  He was seen by the pulmonologist office who recommended a repeat CT scan in approximately 1 year,  otherwise did not recommend any intervention.   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   Hematuria 09/04/2018   BPH (benign prostatic hyperplasia) 09/04/2018   Sepsis secondary to UTI ([place]) 09/04/2018   UTI (urinary tract infection) 09/04/2018   Abnormal ECG during exercise stress test     Past Surgical History:  Procedure Laterality Date   APPENDECTOMY  as child   CARDIAC CATHETERIZATION N/A 10/16/2016   Procedure: Left Heart Cath and Coronary Angiography;  Surgeon: Jettie Booze, MD;  Location: [facility];  Service: Cardiovascular;  Laterality: N/A;   COLONOSCOPY WITH PROPOFOL N/A 02/11/2014   Procedure: COLONOSCOPY WITH PROPOFOL;  Surgeon: Cleotis Nipper, MD;  Location: [facility];  Service: Endoscopy;  Laterality: N/A;   KNEE ARTHROSCOPY Right 2013   torn meniscus repair       Family History  Problem Relation Age of Onset   Alcoholism Mother    Breast cancer Mother    Heart disease Mother    Cancer - Lung Father  Cancer - Ovarian Sister    Breast cancer Sister     Social History   Tobacco Use   Smoking status: Former    Packs/day: 0.50    Years: 20.00    Pack years: 10.00    Types: Cigarettes    Quit date: 09/24/1976    Years since quitting: 45.0   Smokeless tobacco: Never  Vaping Use   Vaping Use: Never used  Substance Use Topics   Alcohol use: Yes    Comment: occassionally 1 glass wine per day   Drug use: No    Home Medications Prior to Admission medications   Medication Sig Start Date End Date Taking? Authorizing Provider  Cyanocobalamin (VITAMIN B 12 PO) Take by mouth daily.     [provider]  finasteride (PROSCAR) 5 MG tablet Take 1 tablet by mouth daily. 03/30/21 12/25/21  [provider]  ibuprofen (ADVIL,MOTRIN) 200 MG tablet Take 400 mg by mouth daily as needed for fever or moderate pain.     [provider]  omeprazole (PRILOSEC) 20 MG capsule Take 20 mg by mouth daily.    [provider]  oxymetazoline (AFRIN) 0.05 % nasal spray Place 1 spray into both nostrils 2 (two) times daily.    [provider]  tamsulosin (FLOMAX) 0.4 MG CAPS capsule Take 0.4 mg by mouth every evening. 08/24/16   [provider]    Allergies    Simvastatin  Review of Systems   Review of Systems  Constitutional:  Negative for chills and fever.  HENT:  Negative for ear pain and sore throat.   Eyes:  Negative for pain and visual disturbance.  Respiratory:  Positive for chest tightness and shortness of breath.   Cardiovascular:  Positive for chest pain. Negative for palpitations.  Gastrointestinal:  Negative for abdominal pain and vomiting.  Genitourinary:  Negative for dysuria and hematuria.  Musculoskeletal:  Negative for arthralgias and back pain.  Skin:  Negative for color change and rash.  Neurological:  Negative for syncope, light-headedness and headaches.  All other systems reviewed and are negative.  Physical Exam Updated Vital Signs BP 115/76    Pulse (!) 56    Temp 97.6 F (36.4 C)    Resp 14    Ht 5\' 8"  (1.727 m)    Wt 66 kg    SpO2 97%    BMI 22.12 kg/m   Physical Exam Constitutional:      General: He is not in acute distress. HENT:     Head: Normocephalic and atraumatic.  Eyes:     Conjunctiva/sclera: Conjunctivae normal.     Pupils: Pupils are equal, round, and reactive to light.  Cardiovascular:     Rate and Rhythm: Normal rate and regular rhythm.  Pulmonary:     Effort: Pulmonary effort is normal. No respiratory distress.  Chest:     Comments: HR 60's and regular, occasional PAC on the tele  monitor Abdominal:     General: There is no distension.     Tenderness: There is no abdominal tenderness.  Skin:    General: Skin is warm and dry.  Neurological:     General: No focal deficit present.     Mental Status: He is alert. Mental status is at baseline.  Psychiatric:        Mood and Affect: Mood normal.        Behavior: Behavior normal.    ED Results / Procedures / Treatments   Labs (all labs ordered are  listed, but only abnormal results are displayed) Labs Reviewed  BASIC METABOLIC PANEL - Abnormal; Notable for the following components:      Result Value   Glucose, Bld 125 (*)    All other components within normal limits  CBC WITH DIFFERENTIAL/PLATELET - Abnormal; Notable for the following components:   RBC 4.14 (*)    All other components within normal limits  D-DIMER, QUANTITATIVE - Abnormal; Notable for the following components:   D-Dimer, Quant 0.65 (*)    All other components within normal limits  MAGNESIUM  TSH  TROPONIN I (HIGH SENSITIVITY)  TROPONIN I (HIGH SENSITIVITY)    EKG EKG Interpretation  Date/Time:  Thursday September 14 2021 12:37:26 EST Ventricular Rate:  66 PR Interval:  197 QRS Duration: 90 QT Interval:  393 QTC Calculation: 412 R Axis:   69 Text Interpretation: Sinus arrhythmia Confirmed by Octaviano Glow [id number]) on 09/14/2021 12:38:53 PM  Radiology CT Angio Chest PE W and/or Wo Contrast  Result Date: 09/14/2021 CLINICAL DATA:  Pulmonary embolism (PE) suspected, positive D-dimer EXAM: CT ANGIOGRAPHY CHEST WITH CONTRAST TECHNIQUE: Multidetector CT imaging of the chest was performed using the standard protocol during bolus administration of intravenous contrast. Multiplanar CT image reconstructions and MIPs were obtained to evaluate the vascular anatomy. CONTRAST:  23mL OMNIPAQUE IOHEXOL 350 MG/ML SOLN COMPARISON:  08/25/2021 FINDINGS: Cardiovascular: Satisfactory opacification of the pulmonary arteries to the segmental level. No  evidence of pulmonary embolism. Normal heart size. No pericardial effusion. Thoracic aorta is normal in caliber with minor plaque. Mediastinum/Nodes: No enlarged nodes. Esophagus and thyroid unremarkable. Lungs/Pleura: Clusters of nodularity again identified right middle lobe. No pleural effusion or pneumothorax. Upper Abdomen: Cholelithiasis. Small nonobstructing left renal calculus. No acute abnormality. Musculoskeletal: No acute osseous abnormality. Review of the MIP images confirms the above findings. IMPRESSION: No evidence of acute pulmonary embolism. 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 Electronically Signed   By: Macy Mis M.D.   On: 09/14/2021 14:16   DG Chest Port 1 View  Result Date: 09/14/2021 CLINICAL DATA:  Chest pain EXAM: PORTABLE CHEST 1 VIEW COMPARISON:  09/04/2018 FINDINGS: The lungs are clear without focal pneumonia, edema, pneumothorax or pleural effusion. The cardiopericardial silhouette is within normal limits for size. The visualized bony structures of the thorax show no acute abnormality. Telemetry leads overlie the chest. IMPRESSION: No active disease. Electronically Signed   By: Misty Stanley M.D.   On: 09/14/2021 12:56    Procedures Procedures   Medications Ordered in ED Medications  iohexol (OMNIPAQUE) 350 MG/ML injection 75 mL (75 mLs Intravenous Contrast Given 09/14/21 1401)    ED Course  I have reviewed the triage vital signs and the nursing notes.  Pertinent labs & imaging results that were available during my care of the patient were reviewed by me and considered in my medical decision making (see chart for details).  This patient presents to the Emergency Department with complaint of chest pain. This involves an extensive number of treatment options, and is a complaint that carries with it a high risk of complications and morbidity.  The differential diagnosis includes ACS vs  Pneumothorax vs Reflux/Gastritis vs MSK pain vs Pneumonia vs PE vs other.   I ordered, reviewed, and interpreted labs, including BMP and CBC.  There were no immediate, life-threatening emergencies found in this labwork.  The patient's troponin level was 3>3. I ordered imaging studies which included dg chest, CT PE I independently  visualized and interpreted imaging which showed no focal infiltrates or evidence of pneumonia, no evidence of PE Additional history was obtained from patient's wife and daughter at bedside Previous records obtained and reviewed showing outpatient cardiac work-up and left heart catheterization 2018, as well as recent CT scan. I personally reviewed the patients ECG which showed sinus rhythm with no acute ischemic findings, no evidence of significant heart block.  His telemetry monitor per my review showed a sinus rhythm, borderline bradycardia with heart rate in the 50s to 60s, occasional PACs.  After the interventions stated above, I reevaluated the patient and found that they remained clinically stable.  Based on the patient's clinical exam, vital signs, risk factors, and ED testing, I felt that the patient's overall risk of life-threatening emergency such as ACS, PE, sepsis, or infection was low.  At this time, I felt the patient's presentation was most clinically consistent with nonspecific chest pain.  Clinical Course as of 09/14/21 1505  Thu Sep 14, 2021  1326 Troponin I (High Sensitivity): 3 [MT]  1400 D-Dimer, Quant(!): 0.65 [MT]  1501 Delta troponins are flat.  CT scan does not show any evidence of acute pulmonary embolism.  Patient is otherwise stable, has minimal complaints, and wanting to go home with family.  They have a cardiology appointment scheduled for tomorrow, which I think is excellent follow-up.  He remains in a sinus rhythm in the ED.  Okay for discharge [MT]    Clinical Course User Index [MT] Renette Hsu, Carola Rhine, MD   Final Clinical Impression(s) /  ED Diagnoses Final diagnoses:  Chest tightness    Rx / DC Orders ED Discharge Orders     None        Langston Masker Carola Rhine, MD 09/14/21 1505

## 2021-09-15 ENCOUNTER — Other Ambulatory Visit: Payer: Self-pay

## 2021-09-15 ENCOUNTER — Ambulatory Visit (INDEPENDENT_AMBULATORY_CARE_PROVIDER_SITE_OTHER): Payer: Medicare Other | Admitting: Cardiology

## 2021-09-15 ENCOUNTER — Encounter: Payer: Self-pay | Admitting: Cardiology

## 2021-09-15 ENCOUNTER — Ambulatory Visit: Payer: Medicare Other | Admitting: Adult Health

## 2021-09-15 VITALS — BP 110/64 | HR 83 | Ht 67.0 in | Wt 143.2 lb

## 2021-09-15 DIAGNOSIS — R072 Precordial pain: Secondary | ICD-10-CM

## 2021-09-15 DIAGNOSIS — R002 Palpitations: Secondary | ICD-10-CM | POA: Diagnosis not present

## 2021-09-15 DIAGNOSIS — E7800 Pure hypercholesterolemia, unspecified: Secondary | ICD-10-CM | POA: Diagnosis not present

## 2021-09-15 DIAGNOSIS — I471 Supraventricular tachycardia: Secondary | ICD-10-CM

## 2021-09-15 DIAGNOSIS — R079 Chest pain, unspecified: Secondary | ICD-10-CM | POA: Diagnosis not present

## 2021-09-15 DIAGNOSIS — R0609 Other forms of dyspnea: Secondary | ICD-10-CM | POA: Diagnosis not present

## 2021-09-15 MED ORDER — METOPROLOL SUCCINATE ER 25 MG PO TB24: 25.0000 mg | ORAL_TABLET | Freq: Every day | ORAL | 3 refills | Status: DC

## 2021-09-15 NOTE — Progress Notes (Signed)
Cardiology Office Note:    Date:  09/15/2021   ID:  Brandon Acosta, DOB September 04, 1941, MRN [id number]  PCP:  Marrian Salvage, [place] [facility] Cardiologist:  Larae Grooms, MD     Referring MD: Marrian Salvage,*    History of Present Illness:    Brandon Acosta is a 80 y.o. male here for the evaluation of possible atrial fibrillation, palpitations, chest pain.  Recent ER visit.  Has previously seen Dr. Irish Lack.  Saw Dr. Wynonia Lawman in the past.  Had chest pain in 2017 that prompted a heart catheterization.  Exercise treadmill test at that time led to cath.  Cardiac catheterization in 2018 showed no significant CAD.  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 Diltiazem was prescribed but he did not take.    Was supposed to have UroLift but went for preop and ECG done and was told had AFIB. Occasional tightness at rest, to throat. 2 hours in morning tightness.  These seem to be actually associated with occasional arrhythmia.  He feels it in his neck at times.  Upon close inspection of the supplied EKGs from the surgical center on December 20-there appears to be sinus rhythm for 4 beats, artifact, then a slightly irregular faster rhythm which clearly have P waves underlying the QRS complexes at an interval compatible with atrial tachycardia.  He then goes back to sinus rhythm at the end of the EKG.  I do not believe that this was in fact atrial fibrillation.  He also had another EKG from his ER visit which shows sinus rhythm with PACs heart rate of 66 bpm.  No ST changes.  Today he is here with his daughter and wife.  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[place])    Arthritis    Diverticulitis    GERD (gastroesophageal reflux disease)    Hyperlipemia    Hyperlipidemia    Osteoporosis    Palpitations     Past Surgical History:  Procedure Laterality Date   APPENDECTOMY  as child   CARDIAC CATHETERIZATION N/A 10/16/2016    Procedure: Left Heart Cath and Coronary Angiography;  Surgeon: Jettie Booze, MD;  Location: [facility];  Service: Cardiovascular;  Laterality: N/A;   COLONOSCOPY WITH PROPOFOL N/A 02/11/2014   Procedure: COLONOSCOPY WITH PROPOFOL;  Surgeon: Cleotis Nipper, MD;  Location: [facility];  Service: Endoscopy;  Laterality: N/A;   KNEE ARTHROSCOPY Right 2013   torn meniscus repair    Current Medications: Current Meds  Medication Sig   Cyanocobalamin (VITAMIN B 12 PO) Take by mouth daily.   finasteride (PROSCAR) 5 MG tablet Take 1 tablet by mouth daily.   ibuprofen (ADVIL,MOTRIN) 200 MG tablet Take 400 mg by mouth daily as needed for fever or moderate pain.    metoprolol succinate (TOPROL-XL) 25 MG 24 hr tablet Take 1 tablet (25 mg total) by mouth daily. Take with or immediately following a meal.   omeprazole (PRILOSEC) 20 MG capsule Take 20 mg by mouth daily.   oxymetazoline (AFRIN) 0.05 % nasal spray Place 1 spray into both nostrils 2 (two) times daily.   tamsulosin (FLOMAX) 0.4 MG CAPS capsule Take 0.4 mg by mouth every evening.     Allergies:   Simvastatin   Social History   Socioeconomic History   Marital status: Married    Spouse name: Not on file   Number of children: Not on file   Years of education: Not on file  Highest education level: Not on file  Occupational History   Not on file  Tobacco Use   Smoking status: Former    Packs/day: 0.50    Years: 20.00    Pack years: 10.00    Types: Cigarettes    Quit date: 09/24/1976    Years since quitting: 45.0   Smokeless tobacco: Never  Vaping Use   Vaping Use: Never used  Substance and Sexual Activity   Alcohol use: Yes    Comment: occassionally 1 glass wine per day   Drug use: No   Sexual activity: Not on file  Other Topics Concern   Not on file  Social History Narrative   Lives with wife   Caffeine use: Tea daily   Right handed    Social Determinants of Health   Financial Resource Strain: Not on file   Food Insecurity: Not on file  Transportation Needs: Not on file  Physical Activity: Not on file  Stress: Not on file  Social Connections: Not on file     Family History: The patient's family history includes Alcoholism in his mother; Breast cancer in his mother and sister; Cancer - Lung in his father; Cancer - Ovarian in his sister; Heart disease in his mother.  ROS:   Please see the history of present illness.    No syncope no fevers no chills no bleeding all other systems reviewed and are negative.  EKGs/Labs/Other Studies Reviewed:    The following studies were reviewed today: Normal heart catheterization 2018, normal echocardiogram 2018  EKG: As described above  Recent Labs: 09/14/2021: BUN 18; Creatinine, Ser 0.75; Hemoglobin 13.9; Magnesium 1.8; Platelets 243; Potassium 3.8; Sodium 136; TSH 1.305  Recent Lipid Panel    Component Value Date/Time   CHOL 198 08/01/2021 1010   TRIG 44.0 08/01/2021 1010   HDL 63.00 08/01/2021 1010   CHOLHDL 3 08/01/2021 1010   VLDL 8.8 08/01/2021 1010   LDLCALC 126 (H) 08/01/2021 1010     Risk Assessment/Calculations:              Physical Exam:    VS:  BP 110/64    Pulse 83    Ht 5\' 7"  (1.702 m)    Wt 143 lb 3.2 oz (65 kg)    SpO2 96%    BMI 22.43 kg/m     Wt Readings from Last 3 Encounters:  09/15/21 143 lb 3.2 oz (65 kg)  09/14/21 145 lb 8.1 oz (66 kg)  09/01/21 145 lb 9.6 oz (66 kg)     GEN:  Well nourished, well developed in no acute distress HEENT: Normal NECK: No JVD; No carotid bruits LYMPHATICS: No lymphadenopathy CARDIAC: RRR, no murmurs, no rubs, gallops RESPIRATORY:  Clear to auscultation without rales, wheezing or rhonchi  ABDOMEN: Soft, non-tender, non-distended MUSCULOSKELETAL:  No edema; No deformity  SKIN: Warm and dry NEUROLOGIC:  Alert and oriented x 3 PSYCHIATRIC:  Normal affect   ASSESSMENT:    1. Palpitations   2. DOE (dyspnea on exertion)   3. Chest pain of uncertain etiology   4.  Precordial pain   5. Atrial tachycardia (HCC)   6. Pure hypercholesterolemia    PLAN:    In order of problems listed above:  Atrial tachycardia ([place]) EKG from surgical center in fact looks like a short burst of atrial tachycardia with P waves noted, not atrial fibrillation.  Concomitant EKGs from the ER looks similar with sinus rhythm and PACs.  He does seem to be symptomatic with  these palpitations.  We will go ahead and place a Zio patch monitor for 14 days.  I want to ensure that he does not have atrial fibrillation.  After the monitor is over, he can take Toprol-XL 25 mg once a day to help suppress some of these beats.  If it is in fact just PACs and paroxysmal atrial tachycardia or PAT, this is benign and will not harm him.  Explained this at length.  Try to avoid any stimulants.  After monitor is performed, and resulted, he will be able to proceed with UroLift surgery barring any severe adverse arrhythmias.  We will also check an echocardiogram given his underlying chest sensation, frequent atrial tachycardia/possible atriopathy.   Pure hypercholesterolemia He is concerned about taking statin.  Last LDL was 126.  We will check a coronary calcium score.  This will help decide potential statin use.    43 minutes spent with patient, review of medical records, EKGs data labs and constructing of note.  Family was helpful for providing additional historical facts.     Medication Adjustments/Labs and Tests Ordered: Current medicines are reviewed at length with the patient today.  Concerns regarding medicines are outlined above.  Orders Placed This Encounter  Procedures   CT CARDIAC SCORING (SELF PAY ONLY)   LONG TERM MONITOR (3-14 DAYS)   ECHOCARDIOGRAM COMPLETE   Meds ordered this encounter  Medications   metoprolol succinate (TOPROL-XL) 25 MG 24 hr tablet    Sig: Take 1 tablet (25 mg total) by mouth daily. Take with or immediately following a meal.    Dispense:  90 tablet     Refill:  3    Patient Instructions  Medication Instructions:  Start Metoprolol Succinate 25 mg a day. Continue all other medications as listed.  *If you need a refill on your cardiac medications before your next appointment, please call your pharmacy*  Testing/Procedures: Your physician has requested that you have an echocardiogram. Echocardiography is a painless test that uses sound waves to create images of your heart. It provides your doctor with information about the size and shape of your heart and how well your hearts chambers and valves are working. This procedure takes approximately one hour. There are no restrictions for this procedure.  Your physician has requested that you have Coronary Calcium score, which is completed by CT scan.  CT is  a painless test that uses an x-ray machine to take clear, detailed pictures of your heart. There are no instructions for this test.  ZIO XT- Long Term Monitor Instructions  Your physician has requested you wear a ZIO patch monitor for 14 days.  This is a single patch monitor. [employer] supplies one patch monitor per enrollment. Additional stickers are not available. Please do not apply patch if you will be having a Nuclear Stress Test,  Echocardiogram, Cardiac CT, MRI, or Chest Xray during the period you would be wearing the  monitor. The patch cannot be worn during these tests. You cannot remove and re-apply the  ZIO XT patch monitor.  Your ZIO patch monitor will be mailed 3 day USPS to your address on file. It may take 3-5 days  to receive your monitor after you have been enrolled.  Once you have received your monitor, please review the enclosed instructions. Your monitor  has already been registered assigning a specific monitor serial # to you.  Billing and Patient Assistance Program Information  We have supplied [employer] with any of your insurance information on file for  billing purposes. [employer] offers a sliding scale Patient  Assistance Program for patients that do not have  insurance, or whose insurance does not completely cover the cost of the ZIO monitor.  You must apply for the Patient Assistance Program to qualify for this discounted rate.  To apply, please call [employer] at [phone], select option 4, select option 2, ask to apply for  Patient Assistance Program. Theodore Demark will ask your household income, and how many people  are in your household. They will quote your out-of-pocket cost based on that information.  [employer] will also be able to set up a 40-month, interest-free payment plan if needed.  Applying the monitor   Shave hair from upper left chest.  Hold abrader disc by orange tab. Rub abrader in 40 strokes over the upper left chest as  indicated in your monitor instructions.  Clean area with 4 enclosed alcohol pads. Let dry.  Apply patch as indicated in monitor instructions. Patch will be placed under collarbone on left  side of chest with arrow pointing upward.  Rub patch adhesive wings for 2 minutes. Remove white label marked "1". Remove the white  label marked "2". Rub patch adhesive wings for 2 additional minutes.  While looking in a mirror, press and release button in center of patch. A small green light will  flash 3-4 times. This will be your only indicator that the monitor has been turned on.  Do not shower for the first 24 hours. You may shower after the first 24 hours.  Press the button if you feel a symptom. You will hear a small click. Record Date, Time and  Symptom in the Patient Logbook.  When you are ready to remove the patch, follow instructions on the last 2 pages of Patient  Logbook. Stick patch monitor onto the last page of Patient Logbook.  Place Patient Logbook in the blue and white box. Use locking tab on box and tape box closed  securely. The blue and white box has prepaid postage on it. Please place it in the mailbox as  soon as possible. Your physician should have your test  results approximately 7 days after the  monitor has been mailed back to [facility].  Call [facility] at [phone] if you have questions regarding  your ZIO XT patch monitor. Call them immediately if you see an orange light blinking on your  monitor.  If your monitor falls off in less than 4 days, contact our Monitor department at [phone].  If your monitor becomes loose or falls off after 4 days call [employer] at [phone] for  suggestions on securing your monitor  Follow-Up: At [facility], you and your health needs are our priority.  As part of our continuing mission to provide you with exceptional heart care, we have created designated Provider Care Teams.  These Care Teams include your primary Cardiologist (physician) and Advanced Practice Providers (APPs -  Physician Assistants and Nurse Practitioners) who all work together to provide you with the care you need, when you need it.  We recommend signing up for the patient portal called "MyChart".  Sign up information is provided on this After Visit Summary.  MyChart is used to connect with patients for Virtual Visits (Telemedicine).  Patients are able to view lab/test results, encounter notes, upcoming appointments, etc.  Non-urgent messages can be sent to your provider as well.   To learn more about what you can do with MyChart, go to [email or web address].    Your  next appointment:   Follow up will be determined after the above testing.}   Thank you for choosing [facility]!!      Signed, Candee Furbish, MD  09/15/2021 5:25 PM    [facility]

## 2021-09-15 NOTE — Patient Instructions (Signed)
Medication Instructions:  Start Metoprolol Succinate 25 mg a day. Continue all other medications as listed.  *If you need a refill on your cardiac medications before your next appointment, please call your pharmacy*  Testing/Procedures: Your physician has requested that you have an echocardiogram. Echocardiography is a painless test that uses sound waves to create images of your heart. It provides your doctor with information about the size and shape of your heart and how well your hearts chambers and valves are working. This procedure takes approximately one hour. There are no restrictions for this procedure.  Your physician has requested that you have Coronary Calcium score, which is completed by CT scan.  CT is  a painless test that uses an x-ray machine to take clear, detailed pictures of your heart. There are no instructions for this test.  ZIO XT- Long Term Monitor Instructions  Your physician has requested you wear a ZIO patch monitor for 14 days.  This is a single patch monitor. [employer] supplies one patch monitor per enrollment. Additional stickers are not available. Please do not apply patch if you will be having a Nuclear Stress Test,  Echocardiogram, Cardiac CT, MRI, or Chest Xray during the period you would be wearing the  monitor. The patch cannot be worn during these tests. You cannot remove and re-apply the  ZIO XT patch monitor.  Your ZIO patch monitor will be mailed 3 day USPS to your address on file. It may take 3-5 days  to receive your monitor after you have been enrolled.  Once you have received your monitor, please review the enclosed instructions. Your monitor  has already been registered assigning a specific monitor serial # to you.  Billing and Patient Assistance Program Information  We have supplied [employer] with any of your insurance information on file for billing purposes. [employer] offers a sliding scale Patient Assistance Program for patients that do not have   insurance, or whose insurance does not completely cover the cost of the ZIO monitor.  You must apply for the Patient Assistance Program to qualify for this discounted rate.  To apply, please call [employer] at [phone], select option 4, select option 2, ask to apply for  Patient Assistance Program. Theodore Demark will ask your household income, and how many people  are in your household. They will quote your out-of-pocket cost based on that information.  [employer] will also be able to set up a 5-month, interest-free payment plan if needed.  Applying the monitor   Shave hair from upper left chest.  Hold abrader disc by orange tab. Rub abrader in 40 strokes over the upper left chest as  indicated in your monitor instructions.  Clean area with 4 enclosed alcohol pads. Let dry.  Apply patch as indicated in monitor instructions. Patch will be placed under collarbone on left  side of chest with arrow pointing upward.  Rub patch adhesive wings for 2 minutes. Remove white label marked "1". Remove the white  label marked "2". Rub patch adhesive wings for 2 additional minutes.  While looking in a mirror, press and release button in center of patch. A small green light will  flash 3-4 times. This will be your only indicator that the monitor has been turned on.  Do not shower for the first 24 hours. You may shower after the first 24 hours.  Press the button if you feel a symptom. You will hear a small click. Record Date, Time and  Symptom in the Patient Logbook.  When you  are ready to remove the patch, follow instructions on the last 2 pages of Patient  Logbook. Stick patch monitor onto the last page of Patient Logbook.  Place Patient Logbook in the blue and white box. Use locking tab on box and tape box closed  securely. The blue and white box has prepaid postage on it. Please place it in the mailbox as  soon as possible. Your physician should have your test results approximately 7 days after the  monitor  has been mailed back to [facility].  Call [facility] at [phone] if you have questions regarding  your ZIO XT patch monitor. Call them immediately if you see an orange light blinking on your  monitor.  If your monitor falls off in less than 4 days, contact our Monitor department at [phone].  If your monitor becomes loose or falls off after 4 days call [employer] at [phone] for  suggestions on securing your monitor  Follow-Up: At [facility], you and your health needs are our priority.  As part of our continuing mission to provide you with exceptional heart care, we have created designated Provider Care Teams.  These Care Teams include your primary Cardiologist (physician) and Advanced Practice Providers (APPs -  Physician Assistants and Nurse Practitioners) who all work together to provide you with the care you need, when you need it.  We recommend signing up for the patient portal called "MyChart".  Sign up information is provided on this After Visit Summary.  MyChart is used to connect with patients for Virtual Visits (Telemedicine).  Patients are able to view lab/test results, encounter notes, upcoming appointments, etc.  Non-urgent messages can be sent to your provider as well.   To learn more about what you can do with MyChart, go to [email or web address].    Your next appointment:   Follow up will be determined after the above testing.}   Thank you for choosing [facility]!!

## 2021-09-15 NOTE — Assessment & Plan Note (Signed)
EKG from surgical center in fact looks like a short burst of atrial tachycardia with P waves noted, not atrial fibrillation.  Concomitant EKGs from the ER looks similar with sinus rhythm and PACs.  He does seem to be symptomatic with these palpitations.  We will go ahead and place a Zio patch monitor for 14 days.  I want to ensure that he does not have atrial fibrillation.  After the monitor is over, he can take Toprol-XL 25 mg once a day to help suppress some of these beats.  If it is in fact just PACs and paroxysmal atrial tachycardia or PAT, this is benign and will not harm him.  Explained this at length.  Try to avoid any stimulants.  After monitor is performed, and resulted, he will be able to proceed with UroLift surgery barring any severe adverse arrhythmias.  We will also check an echocardiogram given his underlying chest sensation, frequent atrial tachycardia/possible atriopathy.

## 2021-09-15 NOTE — Assessment & Plan Note (Signed)
He is concerned about taking statin.  Last LDL was 126.  We will check a coronary calcium score.  This will help decide potential statin use.

## 2021-09-19 ENCOUNTER — Ambulatory Visit (INDEPENDENT_AMBULATORY_CARE_PROVIDER_SITE_OTHER): Payer: Medicare Other

## 2021-09-19 DIAGNOSIS — R002 Palpitations: Secondary | ICD-10-CM

## 2021-09-19 DIAGNOSIS — R0609 Other forms of dyspnea: Secondary | ICD-10-CM

## 2021-09-19 NOTE — Progress Notes (Unsigned)
Enrolled for [employer] to mail a ZIO XT long term holter monitor to the patients address on file.  

## 2021-09-22 DIAGNOSIS — R002 Palpitations: Secondary | ICD-10-CM

## 2021-09-22 DIAGNOSIS — R0609 Other forms of dyspnea: Secondary | ICD-10-CM

## 2021-09-27 ENCOUNTER — Ambulatory Visit (HOSPITAL_COMMUNITY)
Admission: RE | Admit: 2021-09-27 | Discharge: 2021-09-27 | Disposition: A | Discharge status: Home / Self Care | Payer: Medicare Other | Source: Ambulatory Visit | Attending: Cardiology | Admitting: Cardiology

## 2021-09-27 ENCOUNTER — Other Ambulatory Visit: Payer: Self-pay

## 2021-09-27 DIAGNOSIS — R079 Chest pain, unspecified: Secondary | ICD-10-CM | POA: Insufficient documentation

## 2021-09-27 DIAGNOSIS — E785 Hyperlipidemia, unspecified: Secondary | ICD-10-CM | POA: Diagnosis not present

## 2021-09-27 DIAGNOSIS — R0609 Other forms of dyspnea: Secondary | ICD-10-CM | POA: Insufficient documentation

## 2021-09-27 DIAGNOSIS — I080 Rheumatic disorders of both mitral and aortic valves: Secondary | ICD-10-CM | POA: Diagnosis not present

## 2021-09-27 LAB — ECHOCARDIOGRAM COMPLETE
Area-P 1/2: 3.6 cm2
S' Lateral: 3.3 cm

## 2021-09-28 ENCOUNTER — Ambulatory Visit: Payer: Medicare Other | Admitting: Interventional Cardiology

## 2021-09-29 ENCOUNTER — Encounter: Payer: Self-pay | Admitting: Cardiology

## 2021-10-10 DIAGNOSIS — L853 Xerosis cutis: Secondary | ICD-10-CM | POA: Diagnosis not present

## 2021-10-10 DIAGNOSIS — R0609 Other forms of dyspnea: Secondary | ICD-10-CM | POA: Diagnosis not present

## 2021-10-10 DIAGNOSIS — L821 Other seborrheic keratosis: Secondary | ICD-10-CM | POA: Diagnosis not present

## 2021-10-10 DIAGNOSIS — D1801 Hemangioma of skin and subcutaneous tissue: Secondary | ICD-10-CM | POA: Diagnosis not present

## 2021-10-10 DIAGNOSIS — D225 Melanocytic nevi of trunk: Secondary | ICD-10-CM | POA: Diagnosis not present

## 2021-10-10 DIAGNOSIS — Z85828 Personal history of other malignant neoplasm of skin: Secondary | ICD-10-CM | POA: Diagnosis not present

## 2021-10-10 DIAGNOSIS — L570 Actinic keratosis: Secondary | ICD-10-CM | POA: Diagnosis not present

## 2021-10-10 DIAGNOSIS — D2272 Melanocytic nevi of left lower limb, including hip: Secondary | ICD-10-CM | POA: Diagnosis not present

## 2021-10-10 DIAGNOSIS — L720 Epidermal cyst: Secondary | ICD-10-CM | POA: Diagnosis not present

## 2021-10-10 DIAGNOSIS — R002 Palpitations: Secondary | ICD-10-CM | POA: Diagnosis not present

## 2021-10-12 ENCOUNTER — Ambulatory Visit (INDEPENDENT_AMBULATORY_CARE_PROVIDER_SITE_OTHER)
Admission: RE | Admit: 2021-10-12 | Discharge: 2021-10-12 | Disposition: A | Discharge status: Home / Self Care | Payer: Self-pay | Source: Ambulatory Visit | Attending: Cardiology | Admitting: Cardiology

## 2021-10-12 ENCOUNTER — Other Ambulatory Visit: Payer: Self-pay

## 2021-10-12 DIAGNOSIS — R072 Precordial pain: Secondary | ICD-10-CM

## 2021-10-12 DIAGNOSIS — R079 Chest pain, unspecified: Secondary | ICD-10-CM

## 2021-10-13 ENCOUNTER — Encounter: Payer: Self-pay | Admitting: Cardiology

## 2021-10-13 ENCOUNTER — Telehealth: Payer: Self-pay | Admitting: Pulmonary Disease

## 2021-10-13 NOTE — Telephone Encounter (Signed)
Called and spoke with patient and Brandon Acosta states that Brandon Acosta just had a new CT scan done yesterday and that it states the nodules in lungs have grown since last November. And Brandon Acosta wants Dr Erin Fulling to look at at the areas and to get his opinion on the areas. Patient states it no rush Brandon Acosta just wants to get another opinion.   Please Advise Dr Erin Fulling

## 2021-10-15 NOTE — Telephone Encounter (Signed)
Please schedule patient for virtual visit to review scans.   Thanks, JD

## 2021-10-16 NOTE — Telephone Encounter (Signed)
Called and spoke with pt letting him know that JD wants [place] to schedule appt to further discuss recent scans and he verbalized understanding. Mychart visit has been scheduled for pt. Nothing further needed.

## 2021-10-17 ENCOUNTER — Encounter: Payer: Self-pay | Admitting: Cardiology

## 2021-10-17 ENCOUNTER — Other Ambulatory Visit: Payer: Self-pay | Admitting: *Deleted

## 2021-10-17 NOTE — Telephone Encounter (Signed)
Yes.  May proceed with urologic surgery from cardiac perspective.  Also would be happy to discuss findings further with you in clinic.  Appreciate your flexibility in appointment timing.   Candee Furbish, MD

## 2021-10-17 NOTE — Progress Notes (Signed)
°  Per Dr Marlou Porch:  Yes.  May proceed with urologic surgery from cardiac perspective.      Pre-operative Risk Assessment    Patient Name: Brandon Acosta  DOB: [birth year] MRN: [id number]        Request for Surgical Clearance     Procedure:  Urolift   Date of Surgery:  TBD Clearance                                  Surgeon:  Dr  Rosario Adie Surgeon's Group or Practice Name:   Phone number:  [phone] Fax number:  [phone]   Type of Clearance Requested:   Medical   Type of Anesthesia:  Local

## 2021-10-18 ENCOUNTER — Telehealth (INDEPENDENT_AMBULATORY_CARE_PROVIDER_SITE_OTHER): Payer: Medicare Other | Admitting: Pulmonary Disease

## 2021-10-18 ENCOUNTER — Other Ambulatory Visit: Payer: Self-pay

## 2021-10-18 DIAGNOSIS — R1310 Dysphagia, unspecified: Secondary | ICD-10-CM

## 2021-10-18 NOTE — Progress Notes (Signed)
Virtual Visit via Telephone Note  I connected with Brandon Acosta on 10/18/21 at  4:00 PM EST by telephone and verified that I am speaking with the correct person using two identifiers.  Location: Patient: home Provider: clinic   I discussed the limitations, risks, security and privacy concerns of performing an evaluation and management service by telephone and the availability of in person appointments. I also discussed with the patient that there may be a patient responsible charge related to this service. The patient expressed understanding and agreed to proceed.   History of Present Illness: Brandon Acosta is a 81 year old male, former smoker with GERD and BPH who retrurns to pulmonary clinic for abnormal CT Chest scan.  Patient was previously seen on 09/01/2021 for mild areas of scattered micro nodularity of the right lung possibly related to an indolent mycobacterial infection or other atypical infection.  At that time he was asymptomatic and the plan was to follow-up in 1 year with a repeat CT chest scan.  He went to the ER on 09/14/2021 for symptoms of chest pain in which a CTA chest was performed which showed unchanged clusters of small nodules in the right middle lobe.  He then had CT cardiac scan on 10/12/2021 mentioning of changes in size of the nodularity of the right lung.  I reviewed the imaging with the patient and his wife via shared screen.  The patient does report concerns for dysphagia and coughing with meals.  Observations/Objective: Elderly male, no acute distress Breathing comfortably and talking in full sentences No coughing during interview Sclera anicteric  Assessment and Plan: Patient's CT scans with changing infiltrates and a short period of time is concerning for aspiration.  He does not have history of aspiration pneumonias or frequent pneumonias.  We will check a modified barium swallow for further evaluation.  Patient expressed understanding and wishes  to have this done after his upcoming surgery.  There are no concerns at this time in regards to his respiratory status prior to his upcoming surgery.  Follow Up Instructions: Follow-up in 2 months after modified barium swallow.   I discussed the assessment and treatment plan with the patient. The patient was provided an opportunity to ask questions and all were answered. The patient agreed with the plan and demonstrated an understanding of the instructions.   The patient was advised to call back or seek an in-person evaluation if the symptoms worsen or if the condition fails to improve as anticipated.  I provided 20 minutes of non-face-to-face time during this encounter.   Freddi Starr, MD

## 2021-10-18 NOTE — Patient Instructions (Addendum)
We will schedule you for a modified barium swallow in March 2023 to evaluate for aspiration risks  No pulmonary concerns prior to your schedule surgery in February.   We will schedule you for video visit after you barium swallow

## 2021-10-19 ENCOUNTER — Other Ambulatory Visit (HOSPITAL_COMMUNITY): Payer: Self-pay

## 2021-10-19 ENCOUNTER — Telehealth (HOSPITAL_COMMUNITY): Payer: Self-pay

## 2021-10-19 DIAGNOSIS — R059 Cough, unspecified: Secondary | ICD-10-CM

## 2021-10-19 DIAGNOSIS — R1310 Dysphagia, unspecified: Secondary | ICD-10-CM

## 2021-10-19 NOTE — Telephone Encounter (Signed)
Attempted to contact patient to schedule OP MBS - left voicemail. ?

## 2021-10-20 ENCOUNTER — Telehealth: Payer: Self-pay | Admitting: Interventional Cardiology

## 2021-10-20 NOTE — Telephone Encounter (Signed)
Patient is requesting to switch providers from Dr. Irish Lack to Dr. Marlou Porch.   Please advise when able. Thanks

## 2021-10-26 ENCOUNTER — Ambulatory Visit (INDEPENDENT_AMBULATORY_CARE_PROVIDER_SITE_OTHER): Payer: Medicare Other | Admitting: Family

## 2021-10-26 VITALS — BP 120/60 | HR 57 | Temp 97.7°F | Resp 18 | Ht 68.0 in | Wt 146.4 lb

## 2021-10-26 DIAGNOSIS — R251 Tremor, unspecified: Secondary | ICD-10-CM

## 2021-10-26 DIAGNOSIS — E538 Deficiency of other specified B group vitamins: Secondary | ICD-10-CM | POA: Diagnosis not present

## 2021-10-26 DIAGNOSIS — R252 Cramp and spasm: Secondary | ICD-10-CM

## 2021-10-26 DIAGNOSIS — E785 Hyperlipidemia, unspecified: Secondary | ICD-10-CM

## 2021-10-26 NOTE — Progress Notes (Signed)
Brandon Acosta is a 81 y.o. male with the following history as recorded in Epic[employer]:  Patient Active Problem List   Diagnosis Date Noted   Atrial tachycardia ([place]) 09/15/2021   Pure hypercholesterolemia 09/15/2021   Primary osteoarthritis of right knee 04/28/2019   Acute lower UTI 09/04/2018   Prostatitis syndrome 09/04/2018   Hematuria 09/04/2018   BPH (benign prostatic hyperplasia) 09/04/2018   Sepsis secondary to UTI ([place]) 09/04/2018   UTI (urinary tract infection) 09/04/2018   Abnormal ECG during exercise stress test     Current Outpatient Medications  Medication Sig Dispense Refill   Cyanocobalamin (VITAMIN B 12 PO) Take by mouth daily.     finasteride (PROSCAR) 5 MG tablet Take 1 tablet by mouth daily.     ibuprofen (ADVIL,MOTRIN) 200 MG tablet Take 400 mg by mouth daily as needed for fever or moderate pain.      omeprazole (PRILOSEC) 20 MG capsule Take 20 mg by mouth daily.     oxymetazoline (AFRIN) 0.05 % nasal spray Place 1 spray into both nostrils 2 (two) times daily.     tamsulosin (FLOMAX) 0.4 MG CAPS capsule Take 0.4 mg by mouth every evening.     metoprolol succinate (TOPROL-XL) 25 MG 24 hr tablet Take 1 tablet (25 mg total) by mouth daily. Take with or immediately following a meal. 90 tablet 3   No current facility-administered medications for this visit.    Allergies: Simvastatin  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[place])    Arthritis    Diverticulitis    GERD (gastroesophageal reflux disease)    Hyperlipemia    Hyperlipidemia    Osteoporosis    Palpitations     Past Surgical History:  Procedure Laterality Date   APPENDECTOMY  as child   CARDIAC CATHETERIZATION N/A 10/16/2016   Procedure: Left Heart Cath and Coronary Angiography;  Surgeon: Jettie Booze, MD;  Location: [facility];  Service: Cardiovascular;  Laterality: N/A;   COLONOSCOPY WITH PROPOFOL N/A 02/11/2014   Procedure: COLONOSCOPY WITH PROPOFOL;  Surgeon:  Cleotis Nipper, MD;  Location: [facility];  Service: Endoscopy;  Laterality: N/A;   KNEE ARTHROSCOPY Right 2013   torn meniscus repair    Family History  Problem Relation Age of Onset   Alcoholism Mother    Breast cancer Mother    Heart disease Mother    Cancer - Lung Father    Cancer - Ovarian Sister    Breast cancer Sister     Social History   Tobacco Use   Smoking status: Former    Packs/day: 0.50    Years: 20.00    Pack years: 10.00    Types: Cigarettes    Quit date: 09/24/1976    Years since quitting: 45.1   Smokeless tobacco: Never  Substance Use Topics   Alcohol use: Yes    Comment: occassionally 1 glass wine per day    Subjective:   Would like to discuss worsening tremors in both hands; symptoms present for the past year; most noticeable in right hand but has noticed left recently; most problematic with eating and when his hands are at 90 degree angles;   Would like to return to get fasting labs updated- has not been on statin x 6+ months; unable to see lipid specialist until May 2023;   Also still struggling with some cramps at night;     Objective:  Vitals:   10/26/21 1442  BP: 120/60  Pulse: (!) 57  Resp: 18  Temp: 97.7 F (36.5 C)  TempSrc: Oral  SpO2: 97%  Weight: 146 lb 6.4 oz (66.4 kg)  Height: 5\' 8"  (1.727 m)    General: Well developed, well nourished, in no acute distress  Skin : Warm and dry.  Head: Normocephalic and atraumatic  Lungs: Respirations unlabored;  Neurologic: Alert and oriented; speech intact; face symmetrical; moves all extremities well; CNII-XII intact without focal deficit   Assessment:  1. Tremor of both hands   2. Hyperlipidemia, unspecified hyperlipidemia type   3. Leg cramps   4. Low vitamin B12 level     Plan:  Refer to neurology; Will update fasting labs at patient's convenience; to consider statin 2-3 x per week;  Check magnesium; discuss with neurology as well; Check B12 level;   Time spent 30 minutes;    This visit occurred during the SARS-CoV-2 public health emergency.  Safety protocols were in place, including screening questions prior to the visit, additional usage of staff PPE, and extensive cleaning of exam room while observing appropriate contact time as indicated for disinfecting solutions.    No follow-ups on file.  Orders Placed This Encounter  Procedures   Lipid panel    Standing Status:   Future    Standing Expiration Date:   10/26/2022   Magnesium    Standing Status:   Future    Standing Expiration Date:   10/26/2022   B12    Standing Status:   Future    Standing Expiration Date:   10/26/2022   Ambulatory referral to Neurology    Referral Priority:   Routine    Referral Type:   Consultation    Referral Reason:   Specialty Services Required    Requested Specialty:   Neurology    Number of Visits Requested:   1    Requested Prescriptions    No prescriptions requested or ordered in this encounter

## 2021-10-27 ENCOUNTER — Other Ambulatory Visit (INDEPENDENT_AMBULATORY_CARE_PROVIDER_SITE_OTHER): Payer: Medicare Other

## 2021-10-27 DIAGNOSIS — R252 Cramp and spasm: Secondary | ICD-10-CM

## 2021-10-27 DIAGNOSIS — E538 Deficiency of other specified B group vitamins: Secondary | ICD-10-CM

## 2021-10-27 DIAGNOSIS — E785 Hyperlipidemia, unspecified: Secondary | ICD-10-CM

## 2021-10-27 LAB — LIPID PANEL
Cholesterol: 190 mg/dL (ref 0–200)
HDL: 57.1 mg/dL (ref >39.00)
LDL Cholesterol: 123 mg/dL — ABNORMAL HIGH (ref 0–99)
NonHDL: 133.31
Total CHOL/HDL Ratio: 3
Triglycerides: 51 mg/dL (ref 0.0–149.0)
VLDL: 10.2 mg/dL (ref 0.0–40.0)

## 2021-10-27 LAB — VITAMIN B12: Vitamin B-12: 1074 pg/mL — ABNORMAL HIGH (ref 211–911)

## 2021-10-27 LAB — MAGNESIUM: Magnesium: 1.9 mg/dL (ref 1.5–2.5)

## 2021-10-28 ENCOUNTER — Encounter: Payer: Self-pay | Admitting: Family

## 2021-10-29 ENCOUNTER — Encounter: Payer: Self-pay | Admitting: Pulmonary Disease

## 2021-11-16 DIAGNOSIS — I2510 Atherosclerotic heart disease of native coronary artery without angina pectoris: Secondary | ICD-10-CM | POA: Diagnosis not present

## 2021-11-16 DIAGNOSIS — N138 Other obstructive and reflux uropathy: Secondary | ICD-10-CM | POA: Diagnosis not present

## 2021-11-16 DIAGNOSIS — I4891 Unspecified atrial fibrillation: Secondary | ICD-10-CM | POA: Diagnosis not present

## 2021-11-16 DIAGNOSIS — Z87891 Personal history of nicotine dependence: Secondary | ICD-10-CM | POA: Diagnosis not present

## 2021-11-16 DIAGNOSIS — N401 Enlarged prostate with lower urinary tract symptoms: Secondary | ICD-10-CM | POA: Diagnosis not present

## 2021-11-20 ENCOUNTER — Telehealth: Payer: Self-pay | Admitting: Orthopaedic Surgery

## 2021-11-20 NOTE — Telephone Encounter (Signed)
Patient called needing to get the Synvisc One injections again in both knees. The number to contact patient is [phone] or [phone]

## 2021-11-21 NOTE — Telephone Encounter (Signed)
Noted  

## 2021-11-23 DIAGNOSIS — N401 Enlarged prostate with lower urinary tract symptoms: Secondary | ICD-10-CM | POA: Diagnosis not present

## 2021-11-23 DIAGNOSIS — N138 Other obstructive and reflux uropathy: Secondary | ICD-10-CM | POA: Diagnosis not present

## 2021-11-28 ENCOUNTER — Ambulatory Visit (HOSPITAL_COMMUNITY)
Admission: RE | Admit: 2021-11-28 | Discharge: 2021-11-28 | Disposition: A | Discharge status: Home / Self Care | Payer: Medicare Other | Source: Ambulatory Visit | Attending: Pulmonary Disease | Admitting: Pulmonary Disease

## 2021-11-28 ENCOUNTER — Other Ambulatory Visit: Payer: Self-pay

## 2021-11-28 DIAGNOSIS — R059 Cough, unspecified: Secondary | ICD-10-CM | POA: Diagnosis not present

## 2021-11-28 DIAGNOSIS — R1310 Dysphagia, unspecified: Secondary | ICD-10-CM | POA: Insufficient documentation

## 2021-11-28 DIAGNOSIS — R070 Pain in throat: Secondary | ICD-10-CM | POA: Diagnosis not present

## 2021-12-01 ENCOUNTER — Telehealth: Payer: Self-pay | Admitting: Orthopaedic Surgery

## 2021-12-01 NOTE — Telephone Encounter (Signed)
Patient called. He would like to know the status of the gel injections. His call back number is [phone] ?

## 2021-12-05 NOTE — Telephone Encounter (Signed)
Talked with patient and appointment has been scheduled for gel injection.  

## 2021-12-07 ENCOUNTER — Telehealth: Payer: Self-pay

## 2021-12-07 NOTE — Telephone Encounter (Signed)
Approved for SynviscOne, bilateral knee. ?[place] ?Must meet deductible ?Covered at 100% through [facility] after deductible ?No Co-pay ?No PA required ? ?Appt.12/11/2021 with Dr. Ninfa Linden ?

## 2021-12-11 ENCOUNTER — Encounter: Payer: Self-pay | Admitting: Physician Assistant

## 2021-12-11 ENCOUNTER — Ambulatory Visit (INDEPENDENT_AMBULATORY_CARE_PROVIDER_SITE_OTHER): Payer: Medicare Other | Admitting: Physician Assistant

## 2021-12-11 DIAGNOSIS — M1711 Unilateral primary osteoarthritis, right knee: Secondary | ICD-10-CM | POA: Diagnosis not present

## 2021-12-11 DIAGNOSIS — M1712 Unilateral primary osteoarthritis, left knee: Secondary | ICD-10-CM | POA: Diagnosis not present

## 2021-12-11 MED ORDER — HYLAN G-F 20 48 MG/6ML IX SOSY
48.0000 mg | PREFILLED_SYRINGE | INTRA_ARTICULAR | Status: AC | PRN
  Administered 2021-12-11: 48 mg via INTRA_ARTICULAR

## 2021-12-11 MED ORDER — LIDOCAINE HCL 1 % IJ SOLN
3.0000 mL | INTRAMUSCULAR | Status: AC | PRN
  Administered 2021-12-11: 3 mL

## 2021-12-11 NOTE — Progress Notes (Signed)
? ?  Procedure Note ? ?Patient: Brandon Acosta             ?Date of Birth: May 25, 1941           ?MRN: [id number]             ?Visit Date: 12/11/2021 ?HPI: Mr. Hawker comes in today for scheduled Synvisc 1 injections both knees.  He last had injections a of 2022.  He states he worked well until about a month ago.  He has had increased pain and has had to decrease his activity such as walking for exercise.  He states he has weakness with stairs and pushing off due to knee pain.  No new injuries.  He is tried and failed other conservative treatment including steroid injections which have given him minimal relief.  He has no scheduled knee surgery in the next 6 months. ? ?Physical exam: Bilateral knees good range of motion.  Left knee slight effusion no abnormal warmth erythema of either knee. ? ?Procedures: ?Visit Diagnoses:  ?1. Primary osteoarthritis of right knee   ?2. Primary osteoarthritis of left knee   ? ? ?Large Joint Inj: bilateral knee on 12/11/2021 10:11 AM ?Indications: pain ?Details: 22 G 1.5 in needle, anterolateral approach ? ?Arthrogram: No ? ?Medications (Right): 48 mg Hylan 48 MG/6ML; 3 mL lidocaine 1 % ?Medications (Left): 48 mg Hylan 48 MG/6ML; 3 mL lidocaine 1 % ?Aspirate (Left): 11 mL yellow and blood-tinged ?Outcome: tolerated well, no immediate complications ?Procedure, treatment alternatives, risks and benefits explained, specific risks discussed. Consent was given by the patient. Immediately prior to procedure a time out was called to verify the correct patient, procedure, equipment, support staff and site/side marked as required. Patient was prepped and draped in the usual sterile fashion.  ? ? ?Plan: Both knees were wrapped with Ace bandages he will remove these before going to bed this evening.  He understands to wait least 6 months between injections.  Questions were encouraged and answered. ? ? ?

## 2021-12-14 ENCOUNTER — Encounter: Payer: Self-pay | Admitting: Neurology

## 2021-12-14 ENCOUNTER — Ambulatory Visit (INDEPENDENT_AMBULATORY_CARE_PROVIDER_SITE_OTHER): Payer: Medicare Other | Admitting: Neurology

## 2021-12-14 VITALS — BP 106/52 | HR 60 | Ht 68.0 in | Wt 148.8 lb

## 2021-12-14 DIAGNOSIS — G250 Essential tremor: Secondary | ICD-10-CM

## 2021-12-14 NOTE — Patient Instructions (Signed)
You have a rather mild and intermittent tremor of both hands, more noticeable on the right. 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 I do not see any signs or symptoms of parkinson's like disease or what we call parkinsonism.  ? ?For your tremor, I would not recommend any new medication for fear of side effects (especially sleepiness, balance issues, low heart rate or low BP), especially in light of your BP and pulse already on the low normal side, without taking a beta blocker. We do not have to make a scheduled follow up appointment, I would be happy to see you as needed.  ? ?Please remember, that any kind of tremor may be exacerbated by anxiety, anger, nervousness, excitement, dehydration, thyroid disease, sleep deprivation, by caffeine, and low blood sugar values or blood sugar fluctuations. Some medications can exacerbate tremors, such as antidepressant medications.  ? ?

## 2021-12-14 NOTE — Progress Notes (Signed)
Subjective:  ?  ?Patient ID: Deavin Forst is a 81 y.o. male. ? ?HPI ? ? ? ?Star Age, MD, PhD ?[facility] ?[address], [address] ?[phone] ?[place],  [address] ? ?Dear Mickel Baas, ? ?I saw your patient, Reilly Blades, upon your kind request in my neurologic clinic today for initial consultation of his bilateral hand tremor.  The patient is accompanied by his wife today.  As you know, Mr. Heggie is a 81 year old right-handed gentleman with an underlying medical history of reflux disease, hypertension, hyperlipidemia, osteoporosis, palpitations, arthritis, diverticulitis, and angina, who reports an approximately 1+ year history of bilateral hand tremors particularly on the right side.  The hand tremor is not at rest, typically not all the time, fluctuates, primarily noticeable when he holds a utensil to feed himself, spoon or fork may shake at a certain angle and by the time it reaches his mouth, the tremor is a little worse.  He is not particularly bothered by a left-sided tremor.  He has noticed a little bit of trembling with his handwriting.  Overall, he is not keen on trying any new medication for this, in fact, he is not keen on taking much in the way of medication in general.  Of note, he is not actually taking the beta-blocker that he was prescribed.  This was to be used as needed per cardiology.  He has a follow-up appointment pending with Dr. Marlou Porch.  He had an echocardiogram and years ago he had a cardiac catheter even.  He has had some palpitations.  A Holter monitor was done in January 2023 and I reviewed the results, he had a couple of episodes of brief SVT, no evidence of A-fib.  I reviewed your office note from 10/26/2021.  ? ?His blood pressure and pulse are on the lower end of normal typically.  ?He has had mild bradycardia at times.   ? ?He has had recent difficulty with swallowing, he had a swallow study done for this, he feels that his voice is not as strong  as it used to be.  He has not fallen, balance is not as good as it used to be no major issues, no lightheadedness, no vertigo, has arthritis for which he sees orthopedics, he recently got gel injections into both knees.  He has had a prior course of Synvisc injections.   ? ?He does not use a walking aid.  He is very active.  He is a retired English as a second language teacher, lives with his wife, they have 3 grown children, none of the children have tremors.  His mom had a hand tremor and his sister has a hand tremor.  Mom lived to be 25.  He drinks no caffeine on a day-to-day basis.  He stopped drinking coffee because of exacerbation of his reflux.  He tries to hydrate well with water, drinks alcohol in the form of wine, nearly daily, about 5 ounces.  He quit smoking about 35 years ago.  ?For the past several years he has had intermittent cramping in his legs particularly at night, almost nightly in the calf areas bilaterally, and also sometimes in the thigh areas.  Using cider vinegar helps a little bit.  He has had statin intolerance, has tried multiple different medications and is eventually going to see a provider in the lipid management clinic. ? ?He had blood work through your office on 10/27/2021 and I reviewed the results: Vitamin B12 was 1074, magnesium 1.9, lipid panel with a total cholesterol of  190, LDL 123, triglycerides 51.  TSH was normal on 09/14/2021.  He had seen Dr. Jannifer Franklin several years ago for headache.  He had a brain MRI without contrast on 05/05/2018 and I reviewed the results: ?  ?IMPRESSION: This MRI of the brain without contrast shows the following: ?1.    There are no acute findings. ?2.    Mild generalized cortical atrophy that has progressed compared to the 2011 MRI ?3.    Brain parenchyma appears normal for age. ? ?His Past Medical History Is Significant For: ?Past Medical History:  ?Diagnosis Date  ? Angina pectoris ([place])   ? Arthritis   ? Diverticulitis   ? GERD (gastroesophageal reflux disease)   ? Hyperlipemia    ? Hyperlipidemia   ? Osteoporosis   ? Palpitations   ? ? ?His Past Surgical History Is Significant For: ?Past Surgical History:  ?Procedure Laterality Date  ? APPENDECTOMY  as child  ? CARDIAC CATHETERIZATION N/A 10/16/2016  ? Procedure: Left Heart Cath and Coronary Angiography;  Surgeon: Jettie Booze, MD;  Location: [facility];  Service: Cardiovascular;  Laterality: N/A;  ? COLONOSCOPY WITH PROPOFOL N/A 02/11/2014  ? Procedure: COLONOSCOPY WITH PROPOFOL;  Surgeon: Cleotis Nipper, MD;  Location: [facility];  Service: Endoscopy;  Laterality: N/A;  ? KNEE ARTHROSCOPY Right 2013  ? torn meniscus repair  ? ? ?His Family History Is Significant For: ?Family History  ?Problem Relation Age of Onset  ? Alcoholism Mother   ? Breast cancer Mother   ? Heart disease Mother   ? Cancer - Lung Father   ? Breast cancer Sister   ? Cancer - Ovarian Sister   ? Breast cancer Sister   ? Tremor Sister   ? ? ?His Social History Is Significant For: ?Social History  ? ?Socioeconomic History  ? Marital status: Married  ?  Spouse name: Not on file  ? Number of children: Not on file  ? Years of education: Not on file  ? Highest education level: Not on file  ?Occupational History  ? Not on file  ?Tobacco Use  ? Smoking status: Former  ?  Packs/day: 0.50  ?  Years: 20.00  ?  Pack years: 10.00  ?  Types: Cigarettes  ?  Quit date: 09/24/1976  ?  Years since quitting: 45.2  ? Smokeless tobacco: Never  ?Vaping Use  ? Vaping Use: Never used  ?Substance and Sexual Activity  ? Alcohol use: Yes  ?  Comment: occassionally 1 glass wine per day  ? Drug use: No  ? Sexual activity: Not on file  ?Other Topics Concern  ? Not on file  ?Social History Narrative  ? Lives with wife  ? Caffeine use: Tea daily  ? Right handed   ? ?Social Determinants of Health  ? ?Financial Resource Strain: Not on file  ?Food Insecurity: Not on file  ?Transportation Needs: Not on file  ?Physical Activity: Not on file  ?Stress: Not on file  ?Social Connections: Not on  file  ? ? ?His Allergies Are:  ?Allergies  ?Allergen Reactions  ? Simvastatin Other (See Comments)  ?:  ? ?His Current Medications Are:  ?Outpatient Encounter Medications as of 12/14/2021  ?Medication Sig  ? finasteride (PROSCAR) 5 MG tablet Take 1 tablet by mouth daily.  ? ibuprofen (ADVIL,MOTRIN) 200 MG tablet Take 400 mg by mouth daily as needed for fever or moderate pain.   ? omeprazole (PRILOSEC) 20 MG capsule Take 20 mg by mouth  daily.  ? oxymetazoline (AFRIN) 0.05 % nasal spray Place 1 spray into both nostrils 2 (two) times daily.  ? tamsulosin (FLOMAX) 0.4 MG CAPS capsule Take 0.4 mg by mouth every evening.  ? [DISCONTINUED] Cyanocobalamin (VITAMIN B 12 PO) Take by mouth daily.  ? [DISCONTINUED] metoprolol succinate (TOPROL-XL) 25 MG 24 hr tablet Take 1 tablet (25 mg total) by mouth daily. Take with or immediately following a meal.  ? ?No facility-administered encounter medications on file as of 12/14/2021.  ?: ? ? ?Review of Systems:  ?Out of a complete 14 point review of systems, all are reviewed and negative with the exception of these symptoms as listed below: ? ?Review of Systems  ?Neurological:   ?     Bilateral hand tremors, R > L.  Swallowing study done ([facility]).   ? ?Objective:  ?Neurological Exam ? ?Physical Exam ?Physical Examination:  ? ?Vitals:  ? 12/14/21 0803  ?BP: (!) 106/52  ?Pulse: 60  ? ? ?General Examination: The patient is a very pleasant 81 y.o. male in no acute distress. He appears well-developed and well-nourished and well groomed.  ? ?HEENT: Normocephalic, atraumatic, pupils are equal, round and reactive to light, extraocular tracking is good without limitation to gaze excursion or nystagmus noted.  Corrective eyeglasses in place.  Hearing is grossly intact. Face is symmetric with normal facial animation. Speech is clear with no dysarthria noted. There is no obvious hypophonia. There is no lip, neck/head, jaw or voice tremor. Neck is supple with full range of passive and active motion.  There are no carotid bruits on auscultation. Oropharynx exam reveals: mild mouth dryness, adequate dental hygiene. Tongue protrudes centrally and palate elevates symmetrically.  ? ?Chest: Clear to auscu

## 2022-01-03 ENCOUNTER — Telehealth: Payer: Self-pay | Admitting: Family

## 2022-01-03 NOTE — Telephone Encounter (Signed)
Left message for patient to call back and schedule Medicare Annual Wellness Visit (AWV). Please offer to do virtually or by telephone.  Left office number and my jabber #[phone]. ? ?Due for AWVI ? ?Please schedule at anytime with Nurse Health Advisor. ?  ?

## 2022-01-15 ENCOUNTER — Other Ambulatory Visit: Payer: Self-pay | Admitting: Family

## 2022-01-15 ENCOUNTER — Ambulatory Visit (INDEPENDENT_AMBULATORY_CARE_PROVIDER_SITE_OTHER): Payer: Medicare Other

## 2022-01-15 ENCOUNTER — Encounter: Payer: Self-pay | Admitting: Family

## 2022-01-15 VITALS — Ht 68.0 in | Wt 148.0 lb

## 2022-01-15 DIAGNOSIS — E785 Hyperlipidemia, unspecified: Secondary | ICD-10-CM

## 2022-01-15 DIAGNOSIS — Z0000 Encounter for general adult medical examination without abnormal findings: Secondary | ICD-10-CM

## 2022-01-15 NOTE — Progress Notes (Addendum)
? ?Subjective:  ? Jefferson Fullam is a 81 y.o. male who presents for a subsequent Medicare Annual Wellness Visit. ? ?I connected with Loman today by telephone and verified that I am speaking with the correct person using two identifiers. ?Location patient: home ?Location provider: work ?Persons participating in the virtual visit: patient, nurse.  ?  ?I discussed the limitations, risks, security and privacy concerns of performing an evaluation and management service by telephone and the availability of in person appointments. I also discussed with the patient that there may be a patient responsible charge related to this service. The patient expressed understanding and verbally consented to this telephonic visit.  ?  ?Interactive audio and video telecommunications were attempted between this provider and patient, however failed, due to patient having technical difficulties OR patient did not have access to video capability.  We continued and completed visit with audio only. ? ?Some vital signs may be absent or patient reported.  ? ?Time Spent with patient on telephone encounter: 20 minutes ? ? ?Review of Systems    ? ?Cardiac Risk Factors include: dyslipidemia;male gender;advanced age (>30mn, >>33women) ? ?   ?Objective:  ?  ?Today's Vitals  ? 01/15/22 1057 01/15/22 1059  ?Weight: 148 lb (67.1 kg)   ?Height: '5\' 8"'$  (1.727 m)   ?PainSc:  2   ? ?Body mass index is 22.5 kg/m?. ? ? ?  01/15/2022  ? 11:05 AM 09/14/2021  ? 12:41 PM 04/22/2021  ?  8:09 PM 10/15/2018  ? 10:08 AM 09/04/2018  ?  4:29 PM 10/16/2016  ?  8:23 AM 11/09/2014  ? 10:18 AM  ?Advanced Directives  ?Does Patient Have a Medical Advance Directive? Yes Yes No Yes Yes Yes No  ?Type of AParamedicof ABolivarLiving will   HCentralLiving will [place]Living will [place]Living will   ?Does patient want to make changes to medical advance directive?    No - Patient declined No  - Patient declined No - Patient declined   ?Copy of [place] Chart? No - copy requested   No - copy requested No - copy requested No - copy requested   ?Would patient like information on creating a medical advance directive?       No - patient declined information  ? ? ?Current Medications (verified) ?Outpatient Encounter Medications as of 01/15/2022  ?Medication Sig  ? finasteride (PROPECIA) 1 MG tablet Take 1 mg by mouth daily.  ? ibuprofen (ADVIL,MOTRIN) 200 MG tablet Take 400 mg by mouth daily as needed for fever or moderate pain.   ? omeprazole (PRILOSEC) 20 MG capsule Take 20 mg by mouth daily.  ? oxymetazoline (AFRIN) 0.05 % nasal spray Place 1 spray into both nostrils 2 (two) times daily.  ? tamsulosin (FLOMAX) 0.4 MG CAPS capsule Take 0.4 mg by mouth every evening. (Patient not taking: Reported on 01/15/2022)  ? ?No facility-administered encounter medications on file as of 01/15/2022.  ? ? ?Allergies (verified) ?Simvastatin  ? ?History: ?Past Medical History:  ?Diagnosis Date  ? [place]is [place]   ? Arthritis   ? Diverticulitis   ? GERD (gastroesophageal reflux disease)   ? Hyperlipemia   ? Hyperlipidemia   ? Osteoporosis   ? Palpitations   ? ?Past Surgical History:  ?Procedure Laterality Date  ? APPENDECTOMY  as child  ? CARDIAC CATHETERIZATION N/A 10/16/2016  ? Procedure: Left Heart Cath and Coronary Angiography;  Surgeon: JCharlann Lange  Irish Lack, MD;  Location: [facility];  Service: Cardiovascular;  Laterality: N/A;  ? COLONOSCOPY WITH PROPOFOL N/A 02/11/2014  ? Procedure: COLONOSCOPY WITH PROPOFOL;  Surgeon: Cleotis Nipper, MD;  Location: [facility];  Service: Endoscopy;  Laterality: N/A;  ? KNEE ARTHROSCOPY Right 2013  ? torn meniscus repair  ? ?Family History  ?Problem Relation Age of Onset  ? Alcoholism Mother   ? Breast cancer Mother   ? Heart disease Mother   ? Cancer - Lung Father   ? Breast cancer Sister   ? Cancer - Ovarian Sister   ? Breast cancer Sister   ?  Tremor Sister   ? ?Social History  ? ?Socioeconomic History  ? Marital status: Married  ?  Spouse name: Not on file  ? Number of children: Not on file  ? Years of education: Not on file  ? Highest education level: Not on file  ?Occupational History  ? Not on file  ?Tobacco Use  ? Smoking status: Former  ?  Packs/day: 0.50  ?  Years: 20.00  ?  Pack years: 10.00  ?  Types: Cigarettes  ?  Quit date: 09/24/1976  ?  Years since quitting: 45.3  ? Smokeless tobacco: Never  ?Vaping Use  ? Vaping Use: Never used  ?Substance and Sexual Activity  ? Alcohol use: Yes  ?  Comment: occassionally 1 glass wine per day  ? Drug use: No  ? Sexual activity: Not on file  ?Other Topics Concern  ? Not on file  ?Social History Narrative  ? Lives with wife  ? Caffeine use: Tea daily  ? Right handed   ? ?Social Determinants of Health  ? ?Financial Resource Strain: Low Risk   ? Difficulty of Paying Living Expenses: Not hard at all  ?Food Insecurity: No Food Insecurity  ? Worried About Charity fundraiser in the Last Year: Never true  ? Ran Out of Food in the Last Year: Never true  ?Transportation Needs: No Transportation Needs  ? Lack of Transportation (Medical): No  ? Lack of Transportation (Non-Medical): No  ?Physical Activity: Sufficiently Active  ? Days of Exercise per Week: 7 days  ? Minutes of Exercise per Session: 30 min  ?Stress: No Stress Concern Present  ? Feeling of Stress : Not at all  ?Social Connections: Socially Integrated  ? Frequency of Communication with Friends and Family: More than three times a week  ? Frequency of Social Gatherings with Friends and Family: More than three times a week  ? Attends Religious Services: More than 4 times per year  ? Active Member of Clubs or Organizations: Yes  ? Attends Archivist Meetings: More than 4 times per year  ? Marital Status: Married  ? ? ?Tobacco Counseling ?Counseling given: Not Answered ? ? ?Clinical Intake: ? ?Pre-visit preparation completed: Yes ? ?Pain : 0-10 ?Pain  Score: 2  ?Pain Type: Chronic pain ?Pain Location: Knee ?Pain Orientation: Left, Right ?Pain Onset: More than a month ago ?Pain Frequency: Intermittent ? ?  ? ?BMI - recorded: 22.5 ?Nutritional Status: BMI of 19-24  Normal ?Nutritional Risks: None ?Diabetes: No ? ?How often do you need to have someone help you when you read instructions, pamphlets, or other written materials from your doctor or pharmacy?: 1 - Never ? ?Diabetic?No ? ?Interpreter Needed?: No ? ?Information entered by :: Caroleen Hamman LPN ? ? ?Activities of Daily Living ? ?  01/15/2022  ? 11:08 AM  ?In your present  state of health, do you have any difficulty performing the following activities:  ?Hearing? 0  ?Vision? 0  ?Difficulty concentrating or making decisions? 0  ?Walking or climbing stairs? 0  ?Dressing or bathing? 0  ?Doing errands, shopping? 0  ?Preparing Food and eating ? N  ?Using the Toilet? N  ?In the past six months, have you accidently leaked urine? Y  ?Do you have problems with loss of bowel control? N  ?Managing your Medications? N  ?Managing your Finances? N  ?Housekeeping or managing your Housekeeping? N  ? ? ?Patient Care Team: ?Marrian Salvage, FNP as PCP - General (Internal Medicine) ?Jettie Booze, MD as PCP - Cardiology (Cardiology) ?Drinkard, Collie Siad, NP as Nurse Practitioner (Family Medicine) ?Jacolyn Reedy, MD as Consulting Physician (Cardiology) ? ?Indicate any recent Medical Services you may have received from other than [facility] providers in the past year (date may be approximate). ? ?   ?Assessment:  ? This is a routine wellness examination for Khyrie. ? ?Hearing/Vision screen ?Hearing Screening - Comments:: No issues ?Vision Screening - Comments:: Last eye exam-10/2020-has an upcoming appt-Dr. Valetta Close ? ?Dietary issues and exercise activities discussed: ?Current Exercise Habits: Home exercise routine, Type of exercise: stretching;strength training/weights, Time (Minutes): 30, Intensity: Mild, Exercise limited by:  orthopedic condition(s) ? ? Goals Addressed   ? ?  ?  ?  ?  ? This Visit's Progress  ?  Patient Stated     ?  Eat more fruit ?  ? ?  ? ?Depression Screen ? ?  01/15/2022  ? 11:08 AM  ?PHQ 2/9 Scores  ?PHQ -

## 2022-01-15 NOTE — Patient Instructions (Signed)
Mr. Sole , ?Thank you for taking time to complete your Medicare Wellness Visit. I appreciate your ongoing commitment to your health goals. Please review the following plan we discussed and let me know if I can assist you in the future.  ? ?Screening recommendations/referrals: ?Colonoscopy: No longer required ?Recommended yearly ophthalmology/optometry visit for glaucoma screening and checkup ?Recommended yearly dental visit for hygiene and checkup ? ?Vaccinations: ?Influenza vaccine: Up to date ?Pneumococcal vaccine: up to date ?Tdap vaccine: Up to date ?Shingles vaccine: Completed vacccines   ?Covid-19: Up to date ? ?Advanced directives: Please bring a copy of Living Will and/or Healthcare Power of Attorney for your chart. ? ? ?Conditions/risks identified: See problem list ? ?Next appointment: Follow up in one year for your annual wellness visit.  ? ?Preventive Care 12 Years and Older, Male ?Preventive care refers to lifestyle choices and visits with your health care provider that can promote health and wellness. ?What does preventive care include? ?A yearly physical exam. This is also called an annual well check. ?Dental exams once or twice a year. ?Routine eye exams. Ask your health care provider how often you should have your eyes checked. ?Personal lifestyle choices, including: ?Daily care of your teeth and gums. ?Regular physical activity. ?Eating a healthy diet. ?Avoiding tobacco and drug use. ?Limiting alcohol use. ?Practicing safe sex. ?Taking low doses of aspirin every day. ?Taking vitamin and mineral supplements as recommended by your health care provider. ?What happens during an annual well check? ?The services and screenings done by your health care provider during your annual well check will depend on your age, overall health, lifestyle risk factors, and family history of disease. ?Counseling  ?Your health care provider may ask you questions about your: ?Alcohol use. ?Tobacco use. ?Drug  use. ?Emotional well-being. ?Home and relationship well-being. ?Sexual activity. ?Eating habits. ?History of falls. ?Memory and ability to understand (cognition). ?Work and work Statistician. ?Screening  ?You may have the following tests or measurements: ?Height, weight, and BMI. ?Blood pressure. ?Lipid and cholesterol levels. These may be checked every 5 years, or more frequently if you are over 72 years old. ?Skin check. ?Lung cancer screening. You may have this screening every year starting at age 41 if you have a 30-pack-year history of smoking and currently smoke or have quit within the past 15 years. ?Fecal occult blood test (FOBT) of the stool. You may have this test every year starting at age 24. ?Flexible sigmoidoscopy or colonoscopy. You may have a sigmoidoscopy every 5 years or a colonoscopy every 10 years starting at age 43. ?Prostate cancer screening. Recommendations will vary depending on your family history and other risks. ?Hepatitis C blood test. ?Hepatitis B blood test. ?Sexually transmitted disease (STD) testing. ?Diabetes screening. This is done by checking your blood sugar (glucose) after you have not eaten for a while (fasting). You may have this done every 1-3 years. ?Abdominal aortic aneurysm (AAA) screening. You may need this if you are a current or former smoker. ?Osteoporosis. You may be screened starting at age 70 if you are at high risk. ?Talk with your health care provider about your test results, treatment options, and if necessary, the need for more tests. ?Vaccines  ?Your health care provider may recommend certain vaccines, such as: ?Influenza vaccine. This is recommended every year. ?Tetanus, diphtheria, and acellular pertussis (Tdap, Td) vaccine. You may need a Td booster every 10 years. ?Zoster vaccine. You may need this after age 35. ?Pneumococcal 13-valent conjugate (PCV13) vaccine. One dose is  recommended after age 92. ?Pneumococcal polysaccharide (PPSV23) vaccine. One dose is  recommended after age 27. ?Talk to your health care provider about which screenings and vaccines you need and how often you need them. ?This information is not intended to replace advice given to you by your health care provider. Make sure you discuss any questions you have with your health care provider. ?Document Released: 10/07/2015 Document Revised: 05/30/2016 Document Reviewed: 07/12/2015 ?Elsevier Interactive Patient Education ? 2017 [place]. ? ?Fall Prevention in the Home ?Falls can cause injuries. They can happen to people of all ages. There are many things you can do to make your home safe and to help prevent falls. ?What can I do on the outside of my home? ?Regularly fix the edges of walkways and driveways and fix any cracks. ?Remove anything that might make you trip as you walk through a door, such as a raised step or threshold. ?Trim any bushes or trees on the path to your home. ?Use bright outdoor lighting. ?Clear any walking paths of anything that might make someone trip, such as rocks or tools. ?Regularly check to see if handrails are loose or broken. Make sure that both sides of any steps have handrails. ?Any raised decks and porches should have guardrails on the edges. ?Have any leaves, snow, or ice cleared regularly. ?Use sand or salt on walking paths during winter. ?Clean up any spills in your garage right away. This includes oil or grease spills. ?What can I do in the bathroom? ?Use night lights. ?Install grab bars by the toilet and in the tub and shower. Do not use towel bars as grab bars. ?Use non-skid mats or decals in the tub or shower. ?If you need to sit down in the shower, use a plastic, non-slip stool. ?Keep the floor dry. Clean up any water that spills on the floor as soon as it happens. ?Remove soap buildup in the tub or shower regularly. ?Attach bath mats securely with double-sided non-slip rug tape. ?Do not have throw rugs and other things on the floor that can make you  trip. ?What can I do in the bedroom? ?Use night lights. ?Make sure that you have a light by your bed that is easy to reach. ?Do not use any sheets or blankets that are too big for your bed. They should not hang down onto the floor. ?Have a firm chair that has side arms. You can use this for support while you get dressed. ?Do not have throw rugs and other things on the floor that can make you trip. ?What can I do in the kitchen? ?Clean up any spills right away. ?Avoid walking on wet floors. ?Keep items that you use a lot in easy-to-reach places. ?If you need to reach something above you, use a strong step stool that has a grab bar. ?Keep electrical cords out of the way. ?Do not use floor polish or wax that makes floors slippery. If you must use wax, use non-skid floor wax. ?Do not have throw rugs and other things on the floor that can make you trip. ?What can I do with my stairs? ?Do not leave any items on the stairs. ?Make sure that there are handrails on both sides of the stairs and use them. Fix handrails that are broken or loose. Make sure that handrails are as long as the stairways. ?Check any carpeting to make sure that it is firmly attached to the stairs. Fix any carpet that is loose or worn. ?Avoid having  throw rugs at the top or bottom of the stairs. If you do have throw rugs, attach them to the floor with carpet tape. ?Make sure that you have a light switch at the top of the stairs and the bottom of the stairs. If you do not have them, ask someone to add them for you. ?What else can I do to help prevent falls? ?Wear shoes that: ?Do not have high heels. ?Have rubber bottoms. ?Are comfortable and fit you well. ?Are closed at the toe. Do not wear sandals. ?If you use a stepladder: ?Make sure that it is fully opened. Do not climb a closed stepladder. ?Make sure that both sides of the stepladder are locked into place. ?Ask someone to hold it for you, if possible. ?Clearly mark and make sure that you can  see: ?Any grab bars or handrails. ?First and last steps. ?Where the edge of each step is. ?Use tools that help you move around (mobility aids) if they are needed. These include: ?Canes. ?Walkers. ?Scooters. ?Crutches. ?Turn on t

## 2022-01-18 ENCOUNTER — Other Ambulatory Visit (INDEPENDENT_AMBULATORY_CARE_PROVIDER_SITE_OTHER): Payer: Medicare Other

## 2022-01-18 DIAGNOSIS — E785 Hyperlipidemia, unspecified: Secondary | ICD-10-CM | POA: Diagnosis not present

## 2022-01-18 LAB — LIPID PANEL
Cholesterol: 186 mg/dL (ref 0–200)
HDL: 54.7 mg/dL (ref >39.00)
LDL Cholesterol: 119 mg/dL — ABNORMAL HIGH (ref 0–99)
NonHDL: 131.42
Total CHOL/HDL Ratio: 3
Triglycerides: 61 mg/dL (ref 0.0–149.0)
VLDL: 12.2 mg/dL (ref 0.0–40.0)

## 2022-01-23 ENCOUNTER — Ambulatory Visit: Payer: Medicare Other | Admitting: Interventional Cardiology

## 2022-01-23 DIAGNOSIS — Z23 Encounter for immunization: Secondary | ICD-10-CM | POA: Diagnosis not present

## 2022-01-24 ENCOUNTER — Encounter: Payer: Self-pay | Admitting: Orthopaedic Surgery

## 2022-01-24 ENCOUNTER — Other Ambulatory Visit: Payer: Self-pay

## 2022-01-24 ENCOUNTER — Ambulatory Visit (INDEPENDENT_AMBULATORY_CARE_PROVIDER_SITE_OTHER): Payer: Medicare Other | Admitting: Orthopaedic Surgery

## 2022-01-24 DIAGNOSIS — G8929 Other chronic pain: Secondary | ICD-10-CM | POA: Diagnosis not present

## 2022-01-24 DIAGNOSIS — M1711 Unilateral primary osteoarthritis, right knee: Secondary | ICD-10-CM | POA: Diagnosis not present

## 2022-01-24 DIAGNOSIS — M1712 Unilateral primary osteoarthritis, left knee: Secondary | ICD-10-CM

## 2022-01-24 DIAGNOSIS — M25561 Pain in right knee: Secondary | ICD-10-CM

## 2022-01-24 DIAGNOSIS — M25562 Pain in left knee: Secondary | ICD-10-CM | POA: Diagnosis not present

## 2022-01-24 MED ORDER — METHYLPREDNISOLONE ACETATE 40 MG/ML IJ SUSP
40.0000 mg | INTRAMUSCULAR | Status: AC | PRN
  Administered 2022-01-24: 40 mg via INTRA_ARTICULAR

## 2022-01-24 MED ORDER — LIDOCAINE HCL 1 % IJ SOLN
3.0000 mL | INTRAMUSCULAR | Status: AC | PRN
  Administered 2022-01-24: 3 mL

## 2022-01-24 NOTE — Progress Notes (Signed)
? ?Office Visit Note ?  ?Patient: Brandon Acosta           ?Date of Birth: [birth year]           ?MRN: [id number] ?Visit Date: 01/24/2022 ?             ?Requested by: Marrian Salvage, [place] ?[address] ?[address] ?[place],  [place] [address] ?PCP: Marrian Salvage, [place] ? ? ?Assessment & Plan: ?Visit Diagnoses:  ?1. Primary osteoarthritis of right knee   ?2. Primary osteoarthritis of left knee   ?3. Chronic pain of left knee   ?4. Chronic pain of right knee   ? ? ?Plan: I was able to aspirate almost 30 cc of clear fluid off of the left knee and then place a steroid injection of left knee that gave him relief.  He is a perfect candidate for physical therapy to strengthen the muscles around his knees and to help with his balance and coordination getting around in general.  Any modalities per the therapist discretion.  We will work on getting this scheduled at [facility]. ? ?Follow-Up Instructions: Return if symptoms worsen or fail to improve.  ? ?Orders:  ?Orders Placed This Encounter  ?Procedures  ? Large Joint Inj  ? ?No orders of the defined types were placed in this encounter. ? ? ? ? Procedures: ?Large Joint Inj: L knee on 01/24/2022 3:40 PM ?Indications: diagnostic evaluation and pain ?Details: 22 G 1.5 in needle, superolateral approach ? ?Arthrogram: No ? ?Medications: 3 mL lidocaine 1 %; 40 mg methylPREDNISolone acetate 40 MG/ML ?Outcome: tolerated well, no immediate complications ?Procedure, treatment alternatives, risks and benefits explained, specific risks discussed. Consent was given by the patient. Immediately prior to procedure a time out was called to verify the correct patient, procedure, equipment, support staff and site/side marked as required. Patient was prepped and draped in the usual sterile fashion.  ? ? ? ? ?Clinical Data: ?No additional findings. ? ? ?Subjective: ?Chief Complaint  ?Patient presents with  ? Left Knee - Follow-up  ? Right Knee - Follow-up  ?The patient  comes in today about 6 weeks after having hyaluronic acid injections in both knees.  He is 81 years old and is active and playing golf.  He says the right knee is doing very well but the left knee is having problems again.  He says is a little more swollen and still slightly warm.  It really hurt quite a bit this last weekend after playing golf and walking more.  He does report significant more stiffness in the left knee and decreased motion as well. ? ?HPI ? ?Review of Systems ?There is no fever, chills, nausea, vomiting ? ?Objective: ?Vital Signs: There were no vitals taken for this visit. ? ?Physical Exam ?He is alert and oriented x3 and in no acute distress ?Ortho Exam ?Examination of the left knee does show moderate effusion today.  He is varus malalignment and range of motion that is somewhat limited in terms of lacking full flexion secondary to his effusion.  The knee is ligamentously stable ?Specialty Comments:  ?No specialty comments available. ? ?Imaging: ?No results found. ? ? ?PMFS History: ?Patient Active Problem List  ? Diagnosis Date Noted  ? Atrial tachycardia ([place]) 09/15/2021  ? Pure hypercholesterolemia 09/15/2021  ? Primary osteoarthritis of right knee 04/28/2019  ? Acute lower UTI 09/04/2018  ? 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 Hematuria 09/04/2018  ? BPH (benign prostatic hyperplasia) 09/04/2018  ?  Sepsis secondary to UTI ([place]) 09/04/2018  ? UTI (urinary tract infection) 09/04/2018  ? Abnormal ECG during exercise stress test   ? ?Past Medical History:  ?Diagnosis Date  ? Angina pectoris ([place])   ? Arthritis   ? Diverticulitis   ? GERD (gastroesophageal reflux disease)   ? Hyperlipemia   ? Hyperlipidemia   ? Osteoporosis   ? Palpitations   ?  ?Family History  ?Problem Relation Age of Onset  ? Alcoholism Mother   ? Breast cancer Mother   ? Heart disease Mother   ? Cancer - Lung Father   ? Breast cancer Sister   ? Cancer - Ovarian Sister   ? Breast cancer Sister   ? Tremor Sister   ?  ?Past  Surgical History:  ?Procedure Laterality Date  ? APPENDECTOMY  as child  ? CARDIAC CATHETERIZATION N/A 10/16/2016  ? Procedure: Left Heart Cath and Coronary Angiography;  Surgeon: Jettie Booze, MD;  Location: [facility];  Service: Cardiovascular;  Laterality: N/A;  ? COLONOSCOPY WITH PROPOFOL N/A 02/11/2014  ? Procedure: COLONOSCOPY WITH PROPOFOL;  Surgeon: Cleotis Nipper, MD;  Location: [facility];  Service: Endoscopy;  Laterality: N/A;  ? KNEE ARTHROSCOPY Right 2013  ? torn meniscus repair  ? ?Social History  ? ?Occupational History  ? Not on file  ?Tobacco Use  ? Smoking status: Former  ?  Packs/day: 0.50  ?  Years: 20.00  ?  Pack years: 10.00  ?  Types: Cigarettes  ?  Quit date: 09/24/1976  ?  Years since quitting: 45.3  ? Smokeless tobacco: Never  ?Vaping Use  ? Vaping Use: Never used  ?Substance and Sexual Activity  ? Alcohol use: Yes  ?  Comment: occassionally 1 glass wine per day  ? Drug use: No  ? Sexual activity: Not on file  ? ? ? ? ? ? ?

## 2022-01-25 ENCOUNTER — Encounter: Payer: Self-pay | Admitting: Cardiology

## 2022-01-25 ENCOUNTER — Ambulatory Visit (INDEPENDENT_AMBULATORY_CARE_PROVIDER_SITE_OTHER): Payer: Medicare Other | Admitting: Cardiology

## 2022-01-25 DIAGNOSIS — I471 Supraventricular tachycardia: Secondary | ICD-10-CM | POA: Diagnosis not present

## 2022-01-25 DIAGNOSIS — I340 Nonrheumatic mitral (valve) insufficiency: Secondary | ICD-10-CM

## 2022-01-25 DIAGNOSIS — E7800 Pure hypercholesterolemia, unspecified: Secondary | ICD-10-CM

## 2022-01-25 NOTE — Assessment & Plan Note (Signed)
Previously has used Pravachol in the past.  Was on Vytorin 10/40 for about 10 years.  He started to have thigh cramps ended up stopping the statins.  Upon further review, he thinks that it may have been from the Flomax or tamsulosin.  Taking vinegar helps with his cramps.  He is going to talk with Dr. Debara Pickett in our lipid clinic.  He does have an elevated coronary calcium score of 50 which is 17th percent.  Mild increase in risk. ?

## 2022-01-25 NOTE — Patient Instructions (Signed)
Medication Instructions:  The current medical regimen is effective;  continue present plan and medications.  *If you need a refill on your cardiac medications before your next appointment, please call your pharmacy*  Follow-Up: At [facility], you and your health needs are our priority.  As part of our continuing mission to provide you with exceptional heart care, we have created designated Provider Care Teams.  These Care Teams include your primary Cardiologist (physician) and Advanced Practice Providers (APPs -  Physician Assistants and Nurse Practitioners) who all work together to provide you with the care you need, when you need it.  We recommend signing up for the patient portal called "MyChart".  Sign up information is provided on this After Visit Summary.  MyChart is used to connect with patients for Virtual Visits (Telemedicine).  Patients are able to view lab/test results, encounter notes, upcoming appointments, etc.  Non-urgent messages can be sent to your provider as well.   To learn more about what you can do with MyChart, go to [email or web address].    Your next appointment:   1 year(s)  The format for your next appointment:   In Person  Provider:   Dr Mark Skains   Important Information About Sugar       

## 2022-01-25 NOTE — Assessment & Plan Note (Signed)
Noted on prior ZIO monitor.  Brief episodes.  Had originally discussed potential use of Toprol.  Does not need.  Doing well without medication. ?

## 2022-01-25 NOTE — Assessment & Plan Note (Signed)
Mild to moderate seen on most recent echocardiogram.  Continue to monitor clinically.  Watch for any signs of symptoms. ?

## 2022-01-25 NOTE — Progress Notes (Signed)
?Cardiology Office Note:   ? ?Date:  01/25/2022  ? ?ID:  Brandon Acosta, DOB [birth year], MRN [id number] ? ?PCP:  Marrian Salvage, [place] ?  ?[facility] Providers ?Cardiologist:  Candee Furbish, MD    ? ?Referring MD: Marrian Salvage,*  ? ? ?History of Present Illness:   ? ?Brandon Acosta is a 81 y.o. male  ? ?Prior visit: ?Has previously seen Dr. Irish Lack.  Saw Dr. Wynonia Lawman in the past.  Had chest pain in 2017 that prompted a heart catheterization.  Exercise treadmill test at that time led to cath. ?  ?Cardiac catheterization in 2018 showed no significant CAD. ?  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 Diltiazem was prescribed but he did not take.   ?  ?Was supposed to have UroLift but went for preop and ECG done and was told had AFIB. Occasional tightness at rest, to throat. 2 hours in morning tightness.  These seem to be actually associated with occasional arrhythmia.  He feels it in his neck at times. ? ?Upon close inspection of the supplied EKGs from the surgical center on December 20-there appears to be sinus rhythm for 4 beats, artifact, then a slightly irregular faster rhythm which clearly have P waves underlying the QRS complexes at an interval compatible with atrial tachycardia.  He then goes back to sinus rhythm at the end of the EKG.  I do not believe that this was in fact atrial fibrillation. ? ?He also had another EKG from his ER visit which shows sinus rhythm with PACs heart rate of 66 bpm.  No ST changes. ? ?[place] 3 bd open Apt (4 year wait) ?Essential tremor.  ? ?Rare feeling of heart beat.  ?  ? ? ?Past Medical History:  ?Diagnosis Date  ? Angina pectoris ([place])   ? Arthritis   ? Diverticulitis   ? GERD (gastroesophageal reflux disease)   ? Hyperlipemia   ? Hyperlipidemia   ? Osteoporosis   ? Palpitations   ? ? ?Past Surgical History:  ?Procedure Laterality Date  ? APPENDECTOMY  as child  ? CARDIAC CATHETERIZATION N/A 10/16/2016  ? Procedure: Left Heart Cath and Coronary  Angiography;  Surgeon: Jettie Booze, MD;  Location: [facility];  Service: Cardiovascular;  Laterality: N/A;  ? COLONOSCOPY WITH PROPOFOL N/A 02/11/2014  ? Procedure: COLONOSCOPY WITH PROPOFOL;  Surgeon: Cleotis Nipper, MD;  Location: [facility];  Service: Endoscopy;  Laterality: N/A;  ? KNEE ARTHROSCOPY Right 2013  ? torn meniscus repair  ? ? ?Current Medications: ?Current Meds  ?Medication Sig  ? finasteride (PROPECIA) 1 MG tablet Take 1 mg by mouth daily.  ? ibuprofen (ADVIL,MOTRIN) 200 MG tablet Take 400 mg by mouth daily as needed for fever or moderate pain.   ? omeprazole (PRILOSEC) 20 MG capsule Take 20 mg by mouth daily.  ? oxymetazoline (AFRIN) 0.05 % nasal spray Place 1 spray into both nostrils 2 (two) times daily.  ?  ? ?Allergies:   Simvastatin  ? ?Social History  ? ?Socioeconomic History  ? Marital status: Married  ?  Spouse name: Not on file  ? Number of children: Not on file  ? Years of education: Not on file  ? Highest education level: Not on file  ?Occupational History  ? Not on file  ?Tobacco Use  ? Smoking status: Former  ?  Packs/day: 0.50  ?  Years: 20.00  ?  Pack years: 10.00  ?  Types: Cigarettes  ?  Quit date: 09/24/1976  ?  Years since quitting: 45.3  ? Smokeless tobacco: Never  ?Vaping Use  ? Vaping Use: Never used  ?Substance and Sexual Activity  ? Alcohol use: Yes  ?  Comment: occassionally 1 glass wine per day  ? Drug use: No  ? Sexual activity: Not on file  ?Other Topics Concern  ? Not on file  ?Social History Narrative  ? Lives with wife  ? Caffeine use: Tea daily  ? Right handed   ? ?Social Determinants of Health  ? ?Financial Resource Strain: Low Risk   ? Difficulty of Paying Living Expenses: Not hard at all  ?Food Insecurity: No Food Insecurity  ? Worried About Charity fundraiser in the Last Year: Never true  ? Ran Out of Food in the Last Year: Never true  ?Transportation Needs: No Transportation Needs  ? Lack of Transportation (Medical): No  ? Lack of Transportation  (Non-Medical): No  ?Physical Activity: Sufficiently Active  ? Days of Exercise per Week: 7 days  ? Minutes of Exercise per Session: 30 min  ?Stress: No Stress Concern Present  ? Feeling of Stress : Not at all  ?Social Connections: Socially Integrated  ? Frequency of Communication with Friends and Family: More than three times a week  ? Frequency of Social Gatherings with Friends and Family: More than three times a week  ? Attends Religious Services: More than 4 times per year  ? Active Member of Clubs or Organizations: Yes  ? Attends Archivist Meetings: More than 4 times per year  ? Marital Status: Married  ?  ? ?Family History: ?The patient's family history includes Alcoholism in his mother; Breast cancer in his mother, sister, and sister; Cancer - Lung in his father; Cancer - Ovarian in his sister; Heart disease in his mother; Tremor in his sister. ? ?ROS:   ?Please see the history of present illness.    ? All other systems reviewed and are negative. ? ?EKGs/Labs/Other Studies Reviewed:   ? ?The following studies were reviewed today: ?ECHO 09/27/21: ? ? ? 1. Left ventricular ejection fraction, by estimation, is 55 to 60%. Left  ?ventricular ejection fraction by 3D volume is 57 %. The left ventricle has  ?normal function. The left ventricle has no regional wall motion  ?abnormalities. Left ventricular diastolic  ? parameters are consistent with Grade I diastolic dysfunction (impaired  ?relaxation).  ? 2. The mitral valve is normal in structure. Mild to moderate mitral valve  ?regurgitation. Prominent Color Doppled signal suggestive late systolic  ?regurgitation but with normal pulmonary vein spectral Doppler. No evidence  ?of mitral stenosis.  ? 3. Right ventricular systolic function is normal. The right ventricular  ?size is mildly enlarged. There is normal pulmonary artery systolic  ?pressure.  ? 4. Left atrial size was mildly dilated.  ? 5. Right atrial size was mild to moderately dilated.  ? 6. The  aortic valve is tricuspid. Aortic valve regurgitation is mild. No  ?aortic stenosis is present.  ? ?Comparison(s): No prior Echocardiogram.  ? ?Conclusion(s)/Recommendation(s): Considere secondary mitral regurgitation  ?assessment (CMR vs TEE) if clinically indicated.  ? ?Cath 2018: ?The left ventricular systolic function is normal. ?LV end diastolic pressure is normal. ?The left ventricular ejection fraction is 55-65% by visual estimate. ?There is no aortic valve stenosis. ?No significant coronary artery disease. ?  ?Continue preventive therapy. ? ?Calcium score 10/12/21: ?FINDINGS: ?Coronary arteries: Normal origins. ?  ?Coronary Calcium Score: ?  ?Left main:  85 ?  ?Left anterior descending artery: 9 ?  ?Left circumflex artery: 0 ?  ?Right coronary artery: 1 ?  ?Total: 50 ?  ?Percentile: 17 ?  ?Pericardium: Normal. ?  ?Aorta: Normal caliber of ascending aorta. Aortic atherosclerosis ?noted. ? ?Zio 10/10/21: ?Sinus rhythm - avg 63 bpm ?FIrst degree AV block ?Atrial tachycardia, longest 12 beats, avg 115 bpm - 43 episodes ?Symptoms were associated with sinus rhythm (CP, light headed, pounding) ?Occasional PAC's ?Rare PVC's ?No AFIB, no pauses ? ?Did not take Toprol.  ? ? ?Recent Labs: ?09/14/2021: BUN 18; Creatinine, Ser 0.75; Hemoglobin 13.9; Platelets 243; Potassium 3.8; Sodium 136; TSH 1.305 ?10/27/2021: Magnesium 1.9  ?Recent Lipid Panel ?   ?Component Value Date/Time  ? CHOL 186 01/18/2022 0735  ? TRIG 61.0 01/18/2022 0735  ? HDL 54.70 01/18/2022 0735  ? CHOLHDL 3 01/18/2022 0735  ? VLDL 12.2 01/18/2022 0735  ? LDLCALC 119 (H) 01/18/2022 0735  ? ? ? ?Risk Assessment/Calculations:   ? ? ?    ? ?   ? ?Physical Exam:   ? ?VS:  BP 110/66   Pulse 62   Ht '5\' 8"'$  (1.727 m)   Wt 145 lb (65.8 kg)   BMI 22.05 kg/m?    ? ?Wt Readings from Last 3 Encounters:  ?01/25/22 145 lb (65.8 kg)  ?01/15/22 148 lb (67.1 kg)  ?12/14/21 148 lb 12.8 oz (67.5 kg)  ?  ? ?GEN:  Well nourished, well developed in no acute  distress ? ?ASSESSMENT:   ? ?1. Atrial tachycardia ([place])   ?2. Nonrheumatic mitral valve regurgitation   ?3. Pure hypercholesterolemia   ? ?PLAN:   ? ?In order of problems listed above: ? ?Atrial tachycardia ([place]) ?Noted on pr

## 2022-01-29 DIAGNOSIS — H2513 Age-related nuclear cataract, bilateral: Secondary | ICD-10-CM | POA: Diagnosis not present

## 2022-01-29 DIAGNOSIS — H524 Presbyopia: Secondary | ICD-10-CM | POA: Diagnosis not present

## 2022-02-05 ENCOUNTER — Encounter: Payer: Self-pay | Admitting: Internal Medicine

## 2022-02-05 ENCOUNTER — Ambulatory Visit (INDEPENDENT_AMBULATORY_CARE_PROVIDER_SITE_OTHER): Payer: Medicare Other | Admitting: Internal Medicine

## 2022-02-05 VITALS — BP 113/65 | HR 54 | Ht 68.0 in | Wt 146.8 lb

## 2022-02-05 DIAGNOSIS — Z789 Other specified health status: Secondary | ICD-10-CM

## 2022-02-05 DIAGNOSIS — E785 Hyperlipidemia, unspecified: Secondary | ICD-10-CM

## 2022-02-05 DIAGNOSIS — R252 Cramp and spasm: Secondary | ICD-10-CM | POA: Diagnosis not present

## 2022-02-05 MED ORDER — EZETIMIBE 10 MG PO TABS: 10.0000 mg | ORAL_TABLET | Freq: Every day | ORAL | 3 refills | Status: DC

## 2022-02-05 NOTE — Progress Notes (Signed)
? ? ?LIPID CLINIC CONSULT NOTE ? ?Chief Complaint:  ?Manage dyslipidemia ? ?Primary Care Physician: ?Brandon Acosta, [place] ? ?Primary Cardiologist:  ?Brandon Furbish, MD ? ?HPI:  ?Brandon Acosta is a 81 y.o. male who is being seen today for the evaluation of dyslipidemia at the request of Brandon Acosta,*. This is a pleasant 81 year old male patient of Brandon Acosta who is referred for evaluation management of dyslipidemia.  He is a longstanding history of statin use dating back to the early 2000's.  He was initially on pravastatin then Vytorin, simvastatin and rosuvastatin and eventually switch back to pravastatin.  During this period of time he has had very good lipid control with LDL cholesterol as low as the low 70s.  He reports that he was previously seen Dr. Tollie Acosta and had had a couple stress test.  He also had a heart catheterization by Dr. Irish Acosta which showed no significant coronary disease.  More recently he had a coronary calcium score as he reestablish care with Brandon Acosta.  His calcium score was 50, 17th percentile for age and sex matched control, overall very low risk finding.  He is currently not on any statins.  He reports he still has issues with cramping.  He thought this might also be related to tamsulosin which she has stopped taking after he had a UroLift procedure. ? ?PMHx:  ?Past Medical History:  ?Diagnosis Date  ? Angina pectoris ([place])   ? Arthritis   ? Diverticulitis   ? GERD (gastroesophageal reflux disease)   ? Hyperlipemia   ? Hyperlipidemia   ? Osteoporosis   ? Palpitations   ? ? ?Past Surgical History:  ?Procedure Laterality Date  ? APPENDECTOMY  as child  ? CARDIAC CATHETERIZATION N/A 10/16/2016  ? Procedure: Left Heart Cath and Coronary Angiography;  Surgeon: Brandon Booze, MD;  Location: [facility];  Service: Cardiovascular;  Laterality: N/A;  ? COLONOSCOPY WITH PROPOFOL N/A 02/11/2014  ? Procedure: COLONOSCOPY WITH PROPOFOL;  Surgeon: Brandon Nipper, MD;  Location: [facility];  Service: Endoscopy;  Laterality: N/A;  ? KNEE ARTHROSCOPY Right 2013  ? torn meniscus repair  ? ? ?FAMHx:  ?Family History  ?Problem Relation Age of Onset  ? Alcoholism Mother   ? Breast cancer Mother   ? Heart disease Mother   ? Cancer - Lung Father   ? Breast cancer Sister   ? Cancer - Ovarian Sister   ? Breast cancer Sister   ? Tremor Sister   ? ? ?SOCHx:  ? reports that he quit smoking about 45 years ago. His smoking use included cigarettes. He has a 10.00 pack-year smoking history. He has never used smokeless tobacco. He reports current alcohol use. He reports that he does not use drugs. ? ?ALLERGIES:  ?Allergies  ?Allergen Reactions  ? Simvastatin Other (See Comments)  ? ? ?ROS: ?Pertinent items noted in HPI and remainder of comprehensive ROS otherwise negative. ? ?HOME MEDS: ?Current Outpatient Medications on File Prior to Visit  ?Medication Sig Dispense Refill  ? finasteride (PROPECIA) 1 MG tablet Take 1 mg by mouth daily.    ? ibuprofen (ADVIL,MOTRIN) 200 MG tablet Take 400 mg by mouth daily as needed for fever or moderate pain.     ? omeprazole (PRILOSEC) 20 MG capsule Take 20 mg by mouth daily.    ? oxymetazoline (AFRIN) 0.05 % nasal spray Place 1 spray into both nostrils 2 (two) times daily.    ? tamsulosin (FLOMAX) 0.4 MG  CAPS capsule Take 0.4 mg by mouth every evening. (Patient not taking: Reported on 01/15/2022)    ? ?No current facility-administered medications on file prior to visit.  ? ? ?LABS/IMAGING: ?No results found for this or any previous visit (from the past 48 hour(s)). ?No results found. ? ?LIPID PANEL: ?   ?Component Value Date/Time  ? CHOL 186 01/18/2022 0735  ? TRIG 61.0 01/18/2022 0735  ? HDL 54.70 01/18/2022 0735  ? CHOLHDL 3 01/18/2022 0735  ? VLDL 12.2 01/18/2022 0735  ? LDLCALC 119 (H) 01/18/2022 0735  ? ? ?WEIGHTS: ?Wt Readings from Last 3 Encounters:  ?02/05/22 146 lb 12.8 oz (66.6 kg)  ?01/25/22 145 lb (65.8 kg)  ?01/15/22 148 lb (67.1 kg)   ? ? ?VITALS: ?BP 113/65   Pulse (!) 54   Ht '5\' 8"'$  (1.727 m)   Wt 146 lb 12.8 oz (66.6 kg)   SpO2 97%   BMI 22.32 kg/m?  ? ?EXAM: ?Deferred ? ?EKG: ?Deferred ? 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 ?PLAN: ?1.   Brandon Acosta has a low risk cardiac history.  He had very little coronary calcium and no significant coronary disease back in 2018 by cath.  His cholesterol is only barely elevated with dietary modification.  I think he is intolerant of statins due to cramping.  Previously had been on Vytorin and I suspect he tolerated that okay.  The Zetia is probably likely to be well-tolerated and we discussed trying that as an option today.  We will start 10 mg daily.  Repeat lipids in about 3 months.  If he reaches target and feels that he is unchanged with regards to some of his cramping or they have improved, then he should continue on that I suspect it could be prescribed and followed by Brandon Acosta or his primary care provider. ? ?We will contact him with the results of his labs with plan follow-up as needed.  Thanks again for the kind referral ? ?Brandon Casino, MD, [facility], FACP  ?[place]  ?Medical Director of the Advanced Lipid Disorders &  ?Cardiovascular Risk Reduction Clinic ?Diplomate of the [employer] of Clinical Lipidology ?Attending Cardiologist  ?Direct Dial: [phone]  Fax: [phone]  ?Website:  [email or web address] ? ?Brandon Acosta ?02/05/2022, 9:34 AM ?

## 2022-02-05 NOTE — Patient Instructions (Signed)
Medication Instructions:  ?START zetia '10mg'$  daily ? ?*If you need a refill on your cardiac medications before your next appointment, please call your pharmacy* ? ? ?Lab Work: ?FASTING lab work to check cholesterol in 3 months  ? ?If you have labs (blood work) drawn today and your tests are completely normal, you will receive your results only by: ?MyChart Message (if you have MyChart) OR ?A paper copy in the mail ?If you have any lab test that is abnormal or we need to change your treatment, we will call you to review the results. ? ? ?Testing/Procedures: ?NONE ? ? ?Follow-Up: ?At [facility], you and your health needs are our priority.  As part of our continuing mission to provide you with exceptional heart care, we have created designated Provider Care Teams.  These Care Teams include your primary Cardiologist (physician) and Advanced Practice Providers (APPs -  Physician Assistants and Nurse Practitioners) who all work together to provide you with the care you need, when you need it. ? ?We recommend signing up for the patient portal called "MyChart".  Sign up information is provided on this After Visit Summary.  MyChart is used to connect with patients for Virtual Visits (Telemedicine).  Patients are able to view lab/test results, encounter notes, upcoming appointments, etc.  Non-urgent messages can be sent to your provider as well.   ?To learn more about what you can do with MyChart, go to [email or web address].   ? ?Your next appointment:   ?AS NEEDED with Dr. Debara Pickett  ?

## 2022-02-26 DIAGNOSIS — N138 Other obstructive and reflux uropathy: Secondary | ICD-10-CM | POA: Diagnosis not present

## 2022-02-26 DIAGNOSIS — N200 Calculus of kidney: Secondary | ICD-10-CM | POA: Diagnosis not present

## 2022-02-26 DIAGNOSIS — Z87440 Personal history of urinary (tract) infections: Secondary | ICD-10-CM | POA: Diagnosis not present

## 2022-02-26 DIAGNOSIS — N401 Enlarged prostate with lower urinary tract symptoms: Secondary | ICD-10-CM | POA: Diagnosis not present

## 2022-02-27 ENCOUNTER — Encounter: Payer: Self-pay | Admitting: Physical Therapy

## 2022-02-27 ENCOUNTER — Ambulatory Visit: Payer: Medicare Other | Attending: Orthopaedic Surgery | Admitting: Physical Therapy

## 2022-02-27 DIAGNOSIS — G8929 Other chronic pain: Secondary | ICD-10-CM | POA: Diagnosis not present

## 2022-02-27 DIAGNOSIS — M1712 Unilateral primary osteoarthritis, left knee: Secondary | ICD-10-CM | POA: Diagnosis not present

## 2022-02-27 DIAGNOSIS — M25562 Pain in left knee: Secondary | ICD-10-CM | POA: Diagnosis not present

## 2022-02-27 DIAGNOSIS — R262 Difficulty in walking, not elsewhere classified: Secondary | ICD-10-CM | POA: Diagnosis not present

## 2022-02-27 DIAGNOSIS — R252 Cramp and spasm: Secondary | ICD-10-CM | POA: Diagnosis not present

## 2022-02-27 DIAGNOSIS — M1711 Unilateral primary osteoarthritis, right knee: Secondary | ICD-10-CM | POA: Diagnosis not present

## 2022-02-27 NOTE — Therapy (Signed)
OUTPATIENT PHYSICAL THERAPY LOWER EXTREMITY EVALUATION   Patient Name: Angie Hogg MRN: [id number] DOB:December 13, 1940, 81 y.o., male Today's Date: 02/27/2022   PT End of Session - 02/27/22 1456     Visit Number 1    Number of Visits 12    Date for PT Re-Evaluation 04/10/22    Authorization Type Medicare + BCBS    PT Start Time 1450    PT Stop Time 4431    PT Time Calculation (min) 40 min    Activity Tolerance Patient tolerated treatment well    Behavior During Therapy WFL for tasks assessed/performed             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[place])    Arthritis    Diverticulitis    GERD (gastroesophageal reflux disease)    Hyperlipemia    Hyperlipidemia    Osteoporosis    Palpitations    Past Surgical History:  Procedure Laterality Date   APPENDECTOMY  as child   CARDIAC CATHETERIZATION N/A 10/16/2016   Procedure: Left Heart Cath and Coronary Angiography;  Surgeon: Jettie Booze, MD;  Location: [facility];  Service: Cardiovascular;  Laterality: N/A;   COLONOSCOPY WITH PROPOFOL N/A 02/11/2014   Procedure: COLONOSCOPY WITH PROPOFOL;  Surgeon: Cleotis Nipper, MD;  Location: [facility];  Service: Endoscopy;  Laterality: N/A;   KNEE ARTHROSCOPY Right 2013   torn meniscus repair   Patient Active Problem List   Diagnosis Date Noted   Mitral valve regurgitation 01/25/2022   Atrial tachycardia ([place]) 09/15/2021   Pure hypercholesterolemia 09/15/2021   Primary osteoarthritis of right knee 04/28/2019   Acute lower UTI 09/04/2018   Prostatitis syndrome 09/04/2018   Hematuria 09/04/2018   BPH (benign prostatic hyperplasia) 09/04/2018   Sepsis secondary to UTI ([place]) 09/04/2018   UTI (urinary tract infection) 09/04/2018   Abnormal ECG during exercise stress test     PCP: Marrian Salvage, FNP  REFERRING PROVIDER: Mcarthur Rossetti, MD  REFERRING DIAG: M17.11 (ICD-10-CM) - Primary osteoarthritis of right knee M17.12  (ICD-10-CM) - Primary osteoarthritis of left knee  THERAPY DIAG:  Chronic pain of left knee  Cramp and spasm  Difficulty in walking, not elsewhere classified  Rationale for Evaluation and Treatment Rehabilitation  ONSET DATE: ~ 1 yr started difficulty going downstairs  SUBJECTIVE:   SUBJECTIVE STATEMENT: Pt reports bil knee pain starting about 2 years ago, he saw orthopedist about a year ago, given prednisone shots, then had synvisc in both knees which helped through fall, then in Feb started to hurt again going down stairs, got synvisc again in march, helped in R knee, but has been having more trouble with L knee, had more fluid taken off L knee in May and a prednisone shot which helped for short time.  Thinks L knee has more fluid in it again.  He reports he can do stuff, like golf, yard work, but hurts and has to lift L leg to get into car.  Difficulty getting shoes and socks on the L.   Found that the exercises he has been doing for his back seemed to help his knee, so requested to try PT.   PERTINENT HISTORY: From MD notes on 01/24/2022 "The patient comes in today about 6 weeks after having hyaluronic acid injections in both knees.  He is 81 years old and is active and playing golf.  He says the right knee is doing very well but the left knee is having problems  again.  He says is a little more swollen and still slightly warm.  It really hurt quite a bit this last weekend after playing golf and walking more.  He does report significant more stiffness in the left knee and decreased motion as well.Marland KitchenMarland KitchenI was able to aspirate almost 30 cc of clear fluid off of the left knee and then place a steroid injection of left knee that gave him relief.  He is a perfect candidate for physical therapy to strengthen the muscles around his knees and to help with his balance and coordination getting around in general.  Any modalities per the therapist discretion."  Angina, Osteoporosis, Arthritis  PAIN:  Are  you having pain? Yes: NPRS scale: 3/10 Pain location: L knee Pain description: constant achy, sometimes sharp shooting, occasionally feels like it is going to buckle  Aggravating factors: going down stairs, bending, jarring knee, getting up from standing Relieving factors: ice pack, heat, advil  PRECAUTIONS: None  WEIGHT BEARING RESTRICTIONS No  FALLS:  Has patient fallen in last 6 months? No  LIVING ENVIRONMENT: Lives with: lives with their spouse Lives in: House/apartment Stairs: Yes: Internal: 12 steps; can reach both and External: 12 steps; can reach both Has following equipment at home: None  OCCUPATION: retired.  Plays golf, yard work  PLOF: Independent  PATIENT GOALS are there exercises that will help this knee relieve most of the pain so equal to R side   OBJECTIVE:   DIAGNOSTIC FINDINGS: XR on 02/09/2021 Left knee 2 views: Mild to moderate patellofemoral changes.  Medial joint  line with moderate to severe narrowing.  Lateral compartment overall  well-maintained.  No acute fractures knee is well located. Right knee 2 views: No acute fractures.  Knee is well located.  Moderate  to moderately severe narrowing medial joint line.  Mild to moderate  patellofemoral changes.  PATIENT SURVEYS:  FOTO knee 59%, predicted outcome 68% after 11 visits  COGNITION:  Overall cognitive status: Within functional limits for tasks assessed     SENSATION: WFL  EDEMA:  Circumferential: 48.5 cm R, 49 cm L (at joint line) 39 cm R, 40 cm L (proximal)  MUSCLE LENGTH: Hamstrings: WNL - SLR to 80 deg bil  Quadriceps - tightness bil, prone knee bend to 90 deg, cramping in L hamstring  POSTURE: forward head  PALPATION: Tenderness patellar ligament inferior pole patella, increased swelling vastus lateralis  LOWER EXTREMITY ROM:  Active ROM Right eval Left eval  Knee flexion 125 108   Knee extension Lacking 2 deg  Lacking 6 deg     LOWER EXTREMITY MMT:  MMT Right eval  Left eval  Hip flexion 4+ 4+  Hip extension 4+ 4+  Hip internal rotation 5 5  Hip external rotation 5 5  Knee flexion 5 5  Knee extension 5 5     LOWER EXTREMITY SPECIAL TESTS:  Hip special tests: [name] (FABER) test: negative, Hip scouring test: negative  FUNCTIONAL TESTS:  5 times sit to stand: 9.2 seconds without UE assist  GAIT: Distance walked: 50 Assistive device utilized: None Level of assistance: Complete Independence Comments: antalgic, decreased stance time LLE   TODAY'S TREATMENT: 02/27/2022 Self Care - education on findings, POC, interventions, initial HEP Exercises - Prone Knee Flexion 10 reps - Prone Hip Extension  10 reps - Supine Straight Leg Raises  with quad set 5 reps  PATIENT EDUCATION:  Education details: see treatment Person educated: Patient Education method: Explanation, Demonstration, Verbal cues, and Handouts Education comprehension: verbalized  understanding and returned demonstration   HOME EXERCISE PROGRAM: Access Code: [email or web address]  ASSESSMENT:  CLINICAL IMPRESSION: Patient is a 81 y.o. male who was seen today for physical therapy evaluation and treatment for bil knee pain.  He reports increased pain, edema, and decreased AROM in L knee compared to R knee.  Pain in R knee is not currently limiting any activities.  Overall, he demonstrates good LE strength, tenderness primarily in L patellar ligament and L vastus lateralis.  He would benefit from skilled physical therapy to decrease L knee pain, improve activity tolerance and improve ROM.  Today he was given initial HEP for L knee strengthening.     OBJECTIVE IMPAIRMENTS Abnormal gait, difficulty walking, decreased ROM, decreased strength, increased edema, increased fascial restrictions, increased muscle spasms, and pain.   ACTIVITY LIMITATIONS bending, sitting, standing, and stairs  PARTICIPATION LIMITATIONS: community activity and yard work  PERSONAL FACTORS 1 comorbidity: bil knee OA  are  also affecting patient's functional outcome.   REHAB POTENTIAL: Excellent  CLINICAL DECISION MAKING: Stable/uncomplicated  EVALUATION COMPLEXITY: Low   GOALS: Goals reviewed with patient? Yes  SHORT TERM GOALS: Target date: 03/13/2022   Patient will be independent with initial HEP. Baseline: given Goal status: INITIAL   LONG TERM GOALS: Target date: 04/10/2022   Patient will be independent with advanced/ongoing HEP to improve outcomes and carryover.  Baseline: needs progression Goal status: INITIAL  2.  Patient will report at least 75% improvement in L knee pain to improve QOL. Baseline: constant ache Goal status: INITIAL  3.  Patient will demonstrate improved L knee AROM to >/= 2-115 deg to allow for normal gait and stair mechanics. Baseline: 6-108 Goal status: INITIAL  4.  Patient will be able to ambulate 600' with LRAD and normal gait pattern without increased pain to access community.  Baseline: antalgic Goal status: INITIAL  5. Patient will be able to ascend/descend stairs with 1 HR and reciprocal step pattern without knee pain safely to access home and community.  Baseline: increased pain in L knee with descent Goal status: INITIAL  6.  Patient will report 68% on FOTO  to demonstrate improved functional ability. Baseline: 59% Goal status: INITIAL     PLAN: PT FREQUENCY: 1-2x/week  PT DURATION: 6 weeks  PLANNED INTERVENTIONS: Therapeutic exercises, Therapeutic activity, Neuromuscular re-education, Balance training, Gait training, Patient/Family education, Joint mobilization, Stair training, Dry Needling, Electrical stimulation, Cryotherapy, Moist heat, Taping, Ultrasound, Ionotophoresis '4mg'$ /ml Dexamethasone, and Manual therapy  PLAN FOR NEXT SESSION: review and progress HEP, manual to L quad, modalities PRN   Rennie Natter, PT, DPT  02/27/2022, 3:58 PM

## 2022-03-02 ENCOUNTER — Ambulatory Visit: Payer: Medicare Other

## 2022-03-02 DIAGNOSIS — R252 Cramp and spasm: Secondary | ICD-10-CM | POA: Diagnosis not present

## 2022-03-02 DIAGNOSIS — M1712 Unilateral primary osteoarthritis, left knee: Secondary | ICD-10-CM | POA: Diagnosis not present

## 2022-03-02 DIAGNOSIS — M25562 Pain in left knee: Secondary | ICD-10-CM

## 2022-03-02 DIAGNOSIS — R262 Difficulty in walking, not elsewhere classified: Secondary | ICD-10-CM | POA: Diagnosis not present

## 2022-03-02 DIAGNOSIS — M1711 Unilateral primary osteoarthritis, right knee: Secondary | ICD-10-CM | POA: Diagnosis not present

## 2022-03-02 DIAGNOSIS — G8929 Other chronic pain: Secondary | ICD-10-CM | POA: Diagnosis not present

## 2022-03-02 NOTE — Therapy (Addendum)
OUTPATIENT PHYSICAL THERAPY TREATMENT   Patient Name: Brandon Acosta MRN: [id number] DOB:18-Feb-1941, 80 y.o., male Today's Date: 03/02/2022   PT End of Session - 03/02/22 0933     Visit Number 2    Number of Visits 12    Date for PT Re-Evaluation 04/10/22    Authorization Type Medicare + BCBS    PT Start Time [phone]    PT Stop Time 0930    PT Time Calculation (min) 44 min    Activity Tolerance Patient tolerated treatment well    Behavior During Therapy [facility] for tasks assessed/performed              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[place])    Arthritis    Diverticulitis    GERD (gastroesophageal reflux disease)    Hyperlipemia    Hyperlipidemia    Osteoporosis    Palpitations    Past Surgical History:  Procedure Laterality Date   APPENDECTOMY  as child   CARDIAC CATHETERIZATION N/A 10/16/2016   Procedure: Left Heart Cath and Coronary Angiography;  Surgeon: Jettie Booze, MD;  Location: [facility];  Service: Cardiovascular;  Laterality: N/A;   COLONOSCOPY WITH PROPOFOL N/A 02/11/2014   Procedure: COLONOSCOPY WITH PROPOFOL;  Surgeon: Cleotis Nipper, MD;  Location: [facility];  Service: Endoscopy;  Laterality: N/A;   KNEE ARTHROSCOPY Right 2013   torn meniscus repair   Patient Active Problem List   Diagnosis Date Noted   Mitral valve regurgitation 01/25/2022   Atrial tachycardia ([place]) 09/15/2021   Pure hypercholesterolemia 09/15/2021   Primary osteoarthritis of right knee 04/28/2019   Acute lower UTI 09/04/2018   Prostatitis syndrome 09/04/2018   Hematuria 09/04/2018   BPH (benign prostatic hyperplasia) 09/04/2018   Sepsis secondary to UTI ([place]) 09/04/2018   UTI (urinary tract infection) 09/04/2018   Abnormal ECG during exercise stress test     PCP: Marrian Salvage, FNP  REFERRING PROVIDER: Mcarthur Rossetti, MD  REFERRING DIAG: M17.11 (ICD-10-CM) - Primary osteoarthritis of right knee M17.12 (ICD-10-CM) -  Primary osteoarthritis of left knee  THERAPY DIAG:  Chronic pain of left knee  Cramp and spasm  Difficulty in walking, not elsewhere classified  Rationale for Evaluation and Treatment Rehabilitation  ONSET DATE: ~ 1 yr started difficulty going downstairs  SUBJECTIVE:   SUBJECTIVE STATEMENT: Pt reports having difficulty with putting socks/shoes on, getting in car he has to assist his L leg up. No pain at rest, but with activity more pain.  PERTINENT HISTORY: From MD notes on 01/24/2022 "The patient comes in today about 6 weeks after having hyaluronic acid injections in both knees.  He is 81 years old and is active and playing golf.  He says the right knee is doing very well but the left knee is having problems again.  He says is a little more swollen and still slightly warm.  It really hurt quite a bit this last weekend after playing golf and walking more.  He does report significant more stiffness in the left knee and decreased motion as well.Marland KitchenMarland KitchenI was able to aspirate almost 30 cc of clear fluid off of the left knee and then place a steroid injection of left knee that gave him relief.  He is a perfect candidate for physical therapy to strengthen the muscles around his knees and to help with his balance and coordination getting around in general.  Any modalities per the therapist discretion."  Angina, Osteoporosis, Arthritis  PAIN:  Are you having pain? Yes: NPRS scale: 3/10 Pain location: L knee Pain description: constant achy, sometimes sharp shooting, occasionally feels like it is going to buckle  Aggravating factors: going down stairs, bending, jarring knee, getting up from standing Relieving factors: ice pack, heat, advil  PRECAUTIONS: None  WEIGHT BEARING RESTRICTIONS No  FALLS:  Has patient fallen in last 6 months? No  LIVING ENVIRONMENT: Lives with: lives with their spouse Lives in: House/apartment Stairs: Yes: Internal: 12 steps; can reach both and External: 12 steps; can  reach both Has following equipment at home: None  OCCUPATION: retired.  Plays golf, yard work  PLOF: Independent  PATIENT GOALS are there exercises that will help this knee relieve most of the pain so equal to R side   OBJECTIVE:   DIAGNOSTIC FINDINGS: XR on 02/09/2021 Left knee 2 views: Mild to moderate patellofemoral changes.  Medial joint  line with moderate to severe narrowing.  Lateral compartment overall  well-maintained.  No acute fractures knee is well located. Right knee 2 views: No acute fractures.  Knee is well located.  Moderate  to moderately severe narrowing medial joint line.  Mild to moderate  patellofemoral changes.  PATIENT SURVEYS:  FOTO knee 59%, predicted outcome 68% after 11 visits  COGNITION:  Overall cognitive status: Within functional limits for tasks assessed     SENSATION: WFL  EDEMA:  Circumferential: 48.5 cm R, 49 cm L (at joint line) 39 cm R, 40 cm L (proximal)  MUSCLE LENGTH: Hamstrings: WNL - SLR to 80 deg bil  Quadriceps - tightness bil, prone knee bend to 90 deg, cramping in L hamstring  POSTURE: forward head  PALPATION: Tenderness patellar ligament inferior pole patella, increased swelling vastus lateralis  LOWER EXTREMITY ROM:  Active ROM Right eval Left eval  Knee flexion 125 108   Knee extension Lacking 2 deg  Lacking 6 deg     LOWER EXTREMITY MMT:  MMT Right eval Left eval  Hip flexion 4+ 4+  Hip extension 4+ 4+  Hip internal rotation 5 5  Hip external rotation 5 5  Knee flexion 5 5  Knee extension 5 5     LOWER EXTREMITY SPECIAL TESTS:  Hip special tests: [name] (FABER) test: negative, Hip scouring test: negative  FUNCTIONAL TESTS:  5 times sit to stand: 9.2 seconds without UE assist  GAIT: Distance walked: 50 Assistive device utilized: None Level of assistance: Complete Independence Comments: antalgic, decreased stance time LLE   TODAY'S TREATMENT: 03/02/22 Therapeutic Exercise: Bike L2x43mn LAQ no  weight 20 reps Marches bil 10 reps Bridges with red TB 10x S/L clams with red TB 10x bil L SLR x 10 with QS Reviewed prone hip ext  Manual Therapy: STM to L quads- TTP and restrictions throughout  02/27/2022 Self Care - education on findings, POC, interventions, initial HEP Exercises - Prone Knee Flexion 10 reps - Prone Hip Extension  10 reps - Supine Straight Leg Raises  with quad set 5 reps  PATIENT EDUCATION:  Education details: see treatment Person educated: Patient Education method: Explanation, Demonstration, Verbal cues, and Handouts Education comprehension: verbalized understanding and returned demonstration   HOME EXERCISE PROGRAM: Access Code: [email or web address]  ASSESSMENT:  CLINICAL IMPRESSION: Pt showed a good response to treatment today. Progressed HEP to include proximal strengthening to for distal support at the knees. He showed a good demonstration of the exercises with instructions given to prevent compensation. Finished session with STM to L quads to improve ROM and decrease pain in L  knee.   OBJECTIVE IMPAIRMENTS Abnormal gait, difficulty walking, decreased ROM, decreased strength, increased edema, increased fascial restrictions, increased muscle spasms, and pain.   ACTIVITY LIMITATIONS bending, sitting, standing, and stairs  PARTICIPATION LIMITATIONS: community activity and yard work  PERSONAL FACTORS 1 comorbidity: bil knee OA  are also affecting patient's functional outcome.   REHAB POTENTIAL: Excellent  CLINICAL DECISION MAKING: Stable/uncomplicated  EVALUATION COMPLEXITY: Low   GOALS: Goals reviewed with patient? Yes  SHORT TERM GOALS: Target date: 03/13/2022   Patient will be independent with initial HEP. Baseline: given Goal status: INITIAL   LONG TERM GOALS: Target date: 04/10/2022   Patient will be independent with advanced/ongoing HEP to improve outcomes and carryover.  Baseline: needs progression Goal status: INITIAL  2.  Patient will  report at least 75% improvement in L knee pain to improve QOL. Baseline: constant ache Goal status: INITIAL  3.  Patient will demonstrate improved L knee AROM to >/= 2-115 deg to allow for normal gait and stair mechanics. Baseline: 6-108 Goal status: INITIAL  4.  Patient will be able to ambulate 600' with LRAD and normal gait pattern without increased pain to access community.  Baseline: antalgic Goal status: INITIAL  5. Patient will be able to ascend/descend stairs with 1 HR and reciprocal step pattern without knee pain safely to access home and community.  Baseline: increased pain in L knee with descent Goal status: INITIAL  6.  Patient will report 68% on FOTO  to demonstrate improved functional ability. Baseline: 59% Goal status: INITIAL     PLAN: PT FREQUENCY: 1-2x/week  PT DURATION: 6 weeks  PLANNED INTERVENTIONS: Therapeutic exercises, Therapeutic activity, Neuromuscular re-education, Balance training, Gait training, Patient/Family education, Joint mobilization, Stair training, Dry Needling, Electrical stimulation, Cryotherapy, Moist heat, Taping, Ultrasound, Ionotophoresis '4mg'$ /ml Dexamethasone, and Manual therapy  PLAN FOR NEXT SESSION: review and progress exercises, manual to L quad, modalities PRN   Artist Pais, PTA 03/02/2022, 10:03 AM

## 2022-03-06 ENCOUNTER — Encounter: Payer: Self-pay | Admitting: Physical Therapy

## 2022-03-06 ENCOUNTER — Ambulatory Visit: Payer: Medicare Other | Admitting: Physical Therapy

## 2022-03-06 DIAGNOSIS — G8929 Other chronic pain: Secondary | ICD-10-CM | POA: Diagnosis not present

## 2022-03-06 DIAGNOSIS — R252 Cramp and spasm: Secondary | ICD-10-CM

## 2022-03-06 DIAGNOSIS — M1711 Unilateral primary osteoarthritis, right knee: Secondary | ICD-10-CM | POA: Diagnosis not present

## 2022-03-06 DIAGNOSIS — M1712 Unilateral primary osteoarthritis, left knee: Secondary | ICD-10-CM | POA: Diagnosis not present

## 2022-03-06 DIAGNOSIS — R262 Difficulty in walking, not elsewhere classified: Secondary | ICD-10-CM | POA: Diagnosis not present

## 2022-03-06 DIAGNOSIS — M25562 Pain in left knee: Secondary | ICD-10-CM | POA: Diagnosis not present

## 2022-03-06 NOTE — Patient Instructions (Signed)

## 2022-03-06 NOTE — Therapy (Signed)
[facility]PAT[facility] PHYS[facility] THERAPY TREATMENT   Patient Name: Brandon Acosta MRN: [id number] DOB:1941-04-16, 81 y.o., male Today's Date: 03/06/2022   PT End of Session - 03/06/22 1017     Visit Number 3    Number of Visits 12    Date for PT Re-Evaluation 04/10/22    Authorization Type Medicare + BCBS    PT Start Time 4650    PT Stop Time 1103    PT Time Calculation (min) 48 min    Activity Tolerance Patient tolerated treatment well    Behavior During Therapy WFL for tasks assessed/performed              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[place])    Arthritis    Diverticulitis    GERD (gastroesophageal reflux disease)    Hyperlipemia    Hyperlipidemia    Osteoporosis    Palpitations    Past Surgical History:  Procedure Laterality Date   APPENDECTOMY  as child   CARDIAC CATHETERIZATION N/A 10/16/2016   Procedure: Left Heart Cath and Coronary Angiography;  Surgeon: Jettie Booze, MD;  Location[facility];  Service: Cardiovascular;  Laterality: N/A;   COLONOSCOPY WITH PROPOFOL N/A 02/11/2014   Procedure: COLONOSCOPY WITH PROPOFOL;  Surgeon: Cleotis Nipper, MD;  Location: [facility];  Service: Endoscopy;  Laterality: N/A;   KNEE ARTHROSCOPY Right 2013   torn meniscus repair   Patient Active Problem List   Diagnosis Date Noted   Mitral valve regurgitation 01/25/2022   Atrial tachycardia ([place]) 09/15/2021   Pure hypercholesterolemia 09/15/2021   Primary osteoarthritis of right knee 04/28/2019   Acute lower UTI 09/04/2018   Prostatitis syndrome 09/04/2018   Hematuria 09/04/2018   BPH (benign prostatic hyperplasia) 09/04/2018   Sepsis secondary to UTI ([place]) 09/04/2018   UTI (urinary tract infection) 09/04/2018   Abnormal ECG during exercise stress test     PCP: Marrian Salvage, FNP  REFERRING PROVIDER: Mcarthur Rossetti, MD  REFERRING DIAG: M17.11 (ICD-10-CM) - Primary osteoarthritis of right knee M17.12  (ICD-10-CM) - Primary osteoarthritis of left knee  THERAPY DIAG:  Chronic pain of left knee  Cramp and spasm  Difficulty in walking, not elsewhere classified  Rationale for Evaluation and Treatment Rehabilitation  ONSET DATE: ~ 1 yr started difficulty going downstairs  SUBJECTIVE:   SUBJECTIVE STATEMENT: Pt. Reports he was able to bend down and put socks on this morning.  Still a little pain, especially after yard work, taking advil.  Tempted to call MD and see if there is still fluid in his L knee.   Did his HEP already this AM.   PERTINENT HISTORY: From MD notes on 01/24/2022 "The patient comes in today about 6 weeks after having hyaluronic acid injections in both knees.  He is 81 years old and is active and playing golf.  He says the right knee is doing very well but the left knee is having problems again.  He says is a little more swollen and still slightly warm.  It really hurt quite a bit this last weekend after playing golf and walking more.  He does report significant more stiffness in the left knee and decreased motion as well.Mar[facility] KitchenMarland KitchenI was able to aspirate almost 30 cc of clear fluid off of the left knee and then place a steroid injection of left knee that gave him relief.  He is a perfect candidate for physical therapy to strengthen the muscles around his knees and to help  with his balance and coordination getting around in general.  Any modalities per the therapist discretion."  Angina, Osteoporosis, Arthritis  PAIN:  Are you having pain? Yes: NPRS scale: 1/10 Pain location: L knee Pain description: constant achy, sometimes sharp shooting, occasionally feels like it is going to buckle  Aggravating factors: going down stairs, bending, jarring knee, getting up from standing Relieving factors: ice pack, heat, advil  PRECAUTIONS: None  WEIGHT BEARING RESTRICTIONS No  FALLS:  Has patient fallen in last 6 months? No  LIVING ENVIRONMENT: Lives with: lives with their spouse Lives  in: House/apartment Stairs: Yes: Internal: 12 steps; can reach both and External: 12 steps; can reach both Has following equipment at home: None  OCCUPATION: retired.  Plays golf, yard work  PLOF: Independent  PATIENT GOALS are there exercises that will help this knee relieve most of the pain so equal to R side   OBJECTIVE:   DIAGNOSTIC FINDINGS: XR on 02/09/2021 Left knee 2 views: Mild to moderate patellofemoral changes.  Medial joint  line with moderate to severe narrowing.  Lateral compartment overall  well-maintained.  No acute fractures knee is well located. Right knee 2 views: No acute fractures.  Knee is well located.  Moderate  to moderately severe narrowing medial joint line.  Mild to moderate  patellofemoral changes.  PATIENT SURVEYS:  FOTO knee 59%, predicted outcome 68% after 11 visits  COGNITION:  Overall cognitive status: Within functional limits for tasks assessed     SENSATION: WFL  EDEMA:  Circumferential: 48.5 cm R, 49 cm L (at joint line) 39 cm R, 40 cm L (proximal)  MUSCLE LENGTH: Hamstrings: WNL - SLR to 80 deg bil  Quadriceps - tightness bil, prone knee bend to 90 deg, cramping in L hamstring  POSTURE: forward head  PALPATION: Tenderness patellar ligament inferior pole patella, increased swelling vastus lateralis  LOWER EXTREMITY ROM:  Active ROM Right eval Left eval  Knee flexion 125 108   Knee extension Lacking 2 deg  Lacking 6 deg     LOWER EXTREMITY MMT:  MMT Right eval Left eval  Hip flexion 4+ 4+  Hip extension 4+ 4+  Hip internal rotation 5 5  Hip external rotation 5 5  Knee flexion 5 5  Knee extension 5 5     LOWER EXTREMITY SPECIAL TESTS:  Hip special tests: [name] (FABER) test: negative, Hip scouring test: negative  FUNCTIONAL TESTS:  5 times sit to stand: 9.2 seconds without UE assist  GAIT: Distance walked: 50 Assistive device utilized: None Level of assistance: Complete Independence Comments: antalgic,  decreased stance time LLE   TODAY'S TREATMENT: 03/06/2022 Therapeutic Exercise: to improve strength and mobility.  Demo, verbal and tactile cues throughout for technique. Bike L2 x 6 min  Step ups (1 riser) 2 x 10 bil Side step ups (1 riser) 2 x 10 bil   Heel taps (no riser) 2 x 10 bil Lunges 2 x 10 bil (using bolster) Eccentric heel raises x 10 bil  Balance board frontal plane x 20, 1 min hold, repeated sagittal plane.  Manual Therapy: to decrease muscle spasm and pain and improve mobility IASTM to L quad with massage stick, skilled palpation and monitoring with dry needling.  Trigger Point Dry-Needling  Treatment instructions: Expect mild to moderate muscle soreness. S/S of pneumothorax if dry needled over a lung field, and to seek immediate medical attention should they occur. Patient verbalized understanding of these instructions and education.  Patient Consent Given: Yes Education handout provided:  Yes Muscles treated:  L quad (rectus and vastus lateralis) Electrical stimulation performed: No Parameters: N/A Treatment response/outcome: Twitch Response Elicited and Palpable Increase in Muscle Length    03/02/22 Therapeutic Exercise: Bike L2x13mn LAQ no weight 20 reps Marches bil 10 reps Bridges with red TB 10x S/L clams with red TB 10x bil L SLR x 10 with QS Reviewed prone hip ext  Manual Therapy: STM to L quads- TTP and restrictions throughout  02/27/2022 Self Care - education on findings, POC, interventions, initial HEP Exercises - Prone Knee Flexion 10 reps - Prone Hip Extension  10 reps - Supine Straight Leg Raises  with quad set 5 reps  PATIENT EDUCATION:  Education details: purpose and risks of TrDN Person educated: Patient Education method: Explanation and Handouts Education comprehension: verbalized understanding   HOME EXERCISE PROGRAM: Access Code: [email or web address]  ASSESSMENT:  CLINICAL IMPRESSION: WHelmut Hennonis making good progress, able to perform  all exercises today with minimal complaint of L knee pain, noted still has decreased L knee extension with stance and gait.  Noted tightness/trigger points in L quad with manual therapy, after discussion consented to trial of DN, noted decreased tightness following.  He would benefit from continued skilled therapy.    OBJECTIVE IMPAIRMENTS Abnormal gait, difficulty walking, decreased ROM, decreased strength, increased edema, increased fascial restrictions, increased muscle spasms, and pain.   ACTIVITY LIMITATIONS bending, sitting, standing, and stairs  PARTICIPATION LIMITATIONS: community activity and yard work  PERSONAL FACTORS 1 comorbidity: bil knee OA  are also affecting patient's functional outcome.   REHAB POTENTIAL: Excellent  CLINICAL DECISION MAKING: Stable/uncomplicated  EVALUATION COMPLEXITY: Low   GOALS: Goals reviewed with patient? Yes  SHORT TERM GOALS: Target date: 03/13/2022   Patient will be independent with initial HEP. Baseline: given Goal status: MET   LONG TERM GOALS: Target date: 04/10/2022   Patient will be independent with advanced/ongoing HEP to improve outcomes and carryover.  Baseline: needs progression Goal status: IN PROGRESS  2.  Patient will report at least 75% improvement in L knee pain to improve QOL. Baseline: constant ache Goal status: IN PROGRESS  3.  Patient will demonstrate improved L knee AROM to >/= 2-115 deg to allow for normal gait and stair mechanics. Baseline: 6-108 Goal status: IN PROGRESS  4.  Patient will be able to ambulate 600' with LRAD and normal gait pattern without increased pain to access community.  Baseline: antalgic Goal status: IN PROGRESS  5. Patient will be able to ascend/descend stairs with 1 HR and reciprocal step pattern without knee pain safely to access home and community.  Baseline: increased pain in L knee with descent Goal status: IN PROGRESS  6.  Patient will report 68% on FOTO  to demonstrate  improved functional ability. Baseline: 59% Goal status: IN PROGRESS     PLAN: PT FREQUENCY: 1-2x/week  PT DURATION: 6 weeks  PLANNED INTERVENTIONS: Therapeutic exercises, Therapeutic activity, Neuromuscular re-education, Balance training, Gait training, Patient/Family education, Joint mobilization, Stair training, Dry Needling, Electrical stimulation, Cryotherapy, Moist heat, Taping, Ultrasound, Ionotophoresis 433mml Dexamethasone, and Manual therapy  PLAN FOR NEXT SESSION: continue quad/glute/HS strengthening, potential trial DN to hamstrings to improve knee extension.    ElRennie NatterPT, DPT  03/06/2022, 11:17 AM

## 2022-03-09 ENCOUNTER — Ambulatory Visit: Payer: Medicare Other

## 2022-03-09 DIAGNOSIS — R262 Difficulty in walking, not elsewhere classified: Secondary | ICD-10-CM | POA: Diagnosis not present

## 2022-03-09 DIAGNOSIS — R252 Cramp and spasm: Secondary | ICD-10-CM

## 2022-03-09 DIAGNOSIS — M25562 Pain in left knee: Secondary | ICD-10-CM | POA: Diagnosis not present

## 2022-03-09 DIAGNOSIS — G8929 Other chronic pain: Secondary | ICD-10-CM

## 2022-03-09 DIAGNOSIS — M1712 Unilateral primary osteoarthritis, left knee: Secondary | ICD-10-CM | POA: Diagnosis not present

## 2022-03-09 DIAGNOSIS — M1711 Unilateral primary osteoarthritis, right knee: Secondary | ICD-10-CM | POA: Diagnosis not present

## 2022-03-09 NOTE — Therapy (Signed)
[facility] PH[facility] THERAPY TREATMENT   Patient Name: Brandon Acosta MRN: [id number] DOB:[birth year], 81 y.o., male Today's Date: 03/09/2022   PT End of Session - 03/09/22 0936     Visit Number 4    Number of Visits 12    Date for PT Re-Evaluation 04/10/22    Authorization Type Medicare + BCBS    PT Start Time [phone]    PT Stop Time 0930    PT Time Calculation (min) 43 min    Activity Tolerance Patient tolerated treatment well    Behavior During Therapy [facility] for tasks assessed/performed               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[place])    Arthritis    Diverticulitis    GERD (gastroesophageal reflux disease)    Hyperlipemia    Hyperlipidemia    Osteoporosis    Palpitations    Past Surgical History:  Procedure Laterality Date   APPENDECTOMY  as child   CARDIAC CATHETERIZATION N/A 10/16/2016   Procedure: Left Heart Cath and Coronary Angiography;  Surgeon: Jettie Booze, MD;  Location: [facility];  Service: Cardiovascular;  Laterality: N/A;   COLONOSCOPY WITH PROPOFOL N/A 02/11/2014   Procedure: COLONOSCOPY WITH PROPOFOL;  Surgeon: Cleotis Nipper, MD;  Location[facility];  Service: Endoscopy;  Laterality: N/A;   KNEE ARTHROSCOPY Right 2013   torn meniscus repair   Patient Active Problem List   Diagnosis Date Noted   Mitral valve regurgitation 01/25/2022   Atrial tachycardia ([place]) 09/15/2021   Pure hypercholesterolemia 09/15/2021   Primary osteoarthritis of right knee 04/28/2019   Acute lower UTI 09/04/2018   Prostatitis syndrome 09/04/2018   Hematuria 09/04/2018   BPH (benign prostatic hyperplasia) 09/04/2018   Sepsis secondary to UTI ([place]) 09/04/2018   UTI (urinary tract infection) 09/04/2018   Abnormal ECG during exercise stress test     PCP: Marrian Salvage, FNP  REFERRING PROVIDER: Mcarthur Rossetti, MD  REFERRING DIAG: M17.11 (ICD-10-CM) - Primary osteoarthritis of right knee M17.12  (ICD-10-CM) - Primary osteoarthritis of left knee  THERAPY DIAG:  Chronic pain of left knee  Cramp and spasm  Difficulty in walking, not elsewhere classified  Rationale for Evaluation and Treatment Rehabilitation  ONSET DATE: ~ 1 yr started difficulty going downstairs  SUBJECTIVE:   SUBJECTIVE STATEMENT: Pt reports now being able to lift his own leg when getting in car and notes improved ROM in L knee after DN.  PERTINENT HISTORY: From MD notes on 01/24/2022 "The patient comes in today about 6 weeks after having hyaluronic acid injections in both knees.  He is 81 years old and is active and playing golf.  He says the right knee is doing very well but the left knee is having problems again.  He says is a little more swollen and still slightly warm.  It really hurt quite a bit this last weekend after playing golf and walking more.  He does report significant more stiffness in the left knee and decreased motion as well.Marland KitchenMarland KitchenI was able to aspirate almost 30 cc of clear fluid off of the left knee and then place a steroid injection of left knee that gave him relief.  He is a perfect candidate for physical therapy to strengthen the muscles around his knees and to help with his balance and coordination getting around in general.  Any modalities per the therapist discretion."  Angina, Osteoporosis, Arthritis  PAIN:  Are you having  pain? Yes: NPRS scale: 1/10 Pain location: L knee Pain description: constant achy, sometimes sharp shooting, occasionally feels like it is going to buckle  Aggravating factors: going down stairs, bending, jarring knee, getting up from standing Relieving factors: ice pack, heat, advil  PRECAUTIONS: None  WEIGHT BEARING RESTRICTIONS No  FALLS:  Has patient fallen in last 6 months? No  LIVING ENVIRONMENT: Lives with: lives with their spouse Lives in: House/apartment Stairs: Yes: Internal: 12 steps; can reach both and External: 12 steps; can reach both Has following  equipment at home: None  OCCUPATION: retired.  Plays golf, yard work  PLOF: Independent  PATIENT GOALS are there exercises that will help this knee relieve most of the pain so equal to R side   OBJECTIVE:   DIAGNOSTIC FINDINGS: XR on 02/09/2021 Left knee 2 views: Mild to moderate patellofemoral changes.  Medial joint  line with moderate to severe narrowing.  Lateral compartment overall  well-maintained.  No acute fractures knee is well located. Right knee 2 views: No acute fractures.  Knee is well located.  Moderate  to moderately severe narrowing medial joint line.  Mild to moderate  patellofemoral changes.  PATIENT SURVEYS:  FOTO knee 59%, predicted outcome 68% after 11 visits  COGNITION:  Overall cognitive status: Within functional limits for tasks assessed     SENSATION: WFL  EDEMA:  Circumferential: 48.5 cm R, 49 cm L (at joint line) 39 cm R, 40 cm L (proximal)  MUSCLE LENGTH: Hamstrings: WNL - SLR to 80 deg bil  Quadriceps - tightness bil, prone knee bend to 90 deg, cramping in L hamstring  POSTURE: forward head  PALPATION: Tenderness patellar ligament inferior pole patella, increased swelling vastus lateralis  LOWER EXTREMITY ROM:  Active ROM Right eval Left eval Left 03/09/22  Knee flexion 125 108  117  Knee extension Lacking 2 deg  Lacking 6 deg      LOWER EXTREMITY MMT:  MMT Right eval Left eval  Hip flexion 4+ 4+  Hip extension 4+ 4+  Hip internal rotation 5 5  Hip external rotation 5 5  Knee flexion 5 5  Knee extension 5 5     LOWER EXTREMITY SPECIAL TESTS:  Hip special tests: [name] (FABER) test: negative, Hip scouring test: negative  FUNCTIONAL TESTS:  5 times sit to stand: 9.2 seconds without UE assist  GAIT: Distance walked: 50 Assistive device utilized: None Level of assistance: Complete Independence Comments: antalgic, decreased stance time LLE   TODAY'S TREATMENT: 03/09/22 Therapeutic Exercise: Bike: L2x94mn Step ups (1  riser) no UE support 2x10 Step downs with (1 riser) no UE support 10x STS 10x no UE support LAQ no weight 10x with 3 sec hold at top Manual Therapy: IASTM with s/s tools to L quads L knee PROM into flexion L knee tibia on femur mobs grade I, II, III ( for pain and to increase ROM )  03/06/2022 Therapeutic Exercise: to improve strength and mobility.  Demo, verbal and tactile cues throughout for technique. Bike L2 x 6 min  Step ups (1 riser) 2 x 10 bil Side step ups (1 riser) 2 x 10 bil   Heel taps (no riser) 2 x 10 bil Lunges 2 x 10 bil (using bolster) Eccentric heel raises x 10 bil  Balance board frontal plane x 20, 1 min hold, repeated sagittal plane.  Manual Therapy: to decrease muscle spasm and pain and improve mobility IASTM to L quad with massage stick, skilled palpation and monitoring with dry needling.  Trigger Point Dry-Needling  Treatment instructions: Expect mild to moderate muscle soreness. S/S of pneumothorax if dry needled over a lung field, and to seek immediate medical attention should they occur. Patient verbalized understanding of these instructions and education.  Patient Consent Given: Yes Education handout provided: Yes Muscles treated:  L quad (rectus and vastus lateralis) Electrical stimulation performed: No Parameters: N/A Treatment response/outcome: Twitch Response Elicited and Palpable Increase in Muscle Length    03/02/22 Therapeutic Exercise: Bike L2x46mn LAQ no weight 20 reps Marches bil 10 reps Bridges with red TB 10x S/L clams with red TB 10x bil L SLR x 10 with QS Reviewed prone hip ext  Manual Therapy: STM to L quads- TTP and restrictions throughout  02/27/2022 Self Care - education on findings, POC, interventions, initial HEP Exercises - Prone Knee Flexion 10 reps - Prone Hip Extension  10 reps - Supine Straight Leg Raises  with quad set 5 reps  PATIENT EDUCATION:  Education details: purpose and risks of TrDN Person educated:  Patient Education method: ETheatre stage managerEducation comprehension: verbalized understanding   HOME EXERCISE PROGRAM: Access Code: [email or web address]  ASSESSMENT:  CLINICAL IMPRESSION: Pt showed a good demonstration of the exercises today, with no increased pain. His L knee showed a tendency to go into slight valgus on step down. He still showed moderate restriction along the quads during MT. Knee flexion has improved to 117 deg in supine, pt is progressing toward goals.   OBJECTIVE IMPAIRMENTS Abnormal gait, difficulty walking, decreased ROM, decreased strength, increased edema, increased fascial restrictions, increased muscle spasms, and pain.   ACTIVITY LIMITATIONS bending, sitting, standing, and stairs  PARTICIPATION LIMITATIONS: community activity and yard work  PERSONAL FACTORS 1 comorbidity: bil knee OA  are also affecting patient's functional outcome.   REHAB POTENTIAL: Excellent  CLINICAL DECISION MAKING: Stable/uncomplicated  EVALUATION COMPLEXITY: Low   GOALS: Goals reviewed with patient? Yes  SHORT TERM GOALS: Target date: 03/13/2022   Patient will be independent with initial HEP. Baseline: given Goal status: MET   LONG TERM GOALS: Target date: 04/10/2022   Patient will be independent with advanced/ongoing HEP to improve outcomes and carryover.  Baseline: needs progression Goal status: IN PROGRESS  2.  Patient will report at least 75% improvement in L knee pain to improve QOL. Baseline: constant ache Goal status: IN PROGRESS  3.  Patient will demonstrate improved L knee AROM to >/= 2-115 deg to allow for normal gait and stair mechanics. Baseline: 6-108 Goal status: IN PROGRESS (partially met - 03/09/22)  4.  Patient will be able to ambulate 600' with LRAD and normal gait pattern without increased pain to access community.  Baseline: antalgic Goal status: IN PROGRESS  5. Patient will be able to ascend/descend stairs with 1 HR and reciprocal step pattern  without knee pain safely to access home and community.  Baseline: increased pain in L knee with descent Goal status: IN PROGRESS  6.  Patient will report 68% on FOTO  to demonstrate improved functional ability. Baseline: 59% Goal status: IN PROGRESS     PLAN: PT FREQUENCY: 1-2x/week  PT DURATION: 6 weeks  PLANNED INTERVENTIONS: Therapeutic exercises, Therapeutic activity, Neuromuscular re-education, Balance training, Gait training, Patient/Family education, Joint mobilization, Stair training, Dry Needling, Electrical stimulation, Cryotherapy, Moist heat, Taping, Ultrasound, Ionotophoresis 433mml Dexamethasone, and Manual therapy  PLAN FOR NEXT SESSION: continue quad/glute/HS strengthening, potential trial DN to hamstrings to improve knee extension.    BrArtist PaisPTA 03/09/2022, 10:01 AM

## 2022-03-13 ENCOUNTER — Encounter: Payer: Self-pay | Admitting: Physical Therapy

## 2022-03-13 ENCOUNTER — Ambulatory Visit: Payer: Medicare Other | Admitting: Physical Therapy

## 2022-03-13 DIAGNOSIS — R262 Difficulty in walking, not elsewhere classified: Secondary | ICD-10-CM

## 2022-03-13 DIAGNOSIS — R252 Cramp and spasm: Secondary | ICD-10-CM | POA: Diagnosis not present

## 2022-03-13 DIAGNOSIS — G8929 Other chronic pain: Secondary | ICD-10-CM | POA: Diagnosis not present

## 2022-03-13 DIAGNOSIS — M1711 Unilateral primary osteoarthritis, right knee: Secondary | ICD-10-CM | POA: Diagnosis not present

## 2022-03-13 DIAGNOSIS — M1712 Unilateral primary osteoarthritis, left knee: Secondary | ICD-10-CM | POA: Diagnosis not present

## 2022-03-13 DIAGNOSIS — M25562 Pain in left knee: Secondary | ICD-10-CM | POA: Diagnosis not present

## 2022-03-13 NOTE — Therapy (Signed)
[facility] OUTPATIENT PHYSICAL THERAPY TREATMENT   Patient Name: Brandon Acosta MRN: [id number] DOB:08-08-1941, 81 y.o., male Today's Date: 03/13/2022   PT End of Session - 03/13/22 1017     Visit Number 5    Number of Visits 12    Date for PT Re-Evaluation 04/10/22    Authorization Type Medicare + BCBS    PT Start Time 1017    PT Stop Time 1059    PT Time Calculation (min) 42 min    Activity Tolerance Patient tolerated treatment well    Behavior During Therapy WFL for tasks assessed/performed               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[place])    Arthritis    Diverticulitis    GERD (gastroesophageal reflux disease)    Hyperlipemia    Hyperlipidemia    Osteoporosis    Palpitations    Past Surgical History:  Procedure Laterality Date   APPENDECTOMY  as child   CARDIAC CATHETERIZATION N/A 10/16/2016   Procedure: Left Heart Cath and Coronary Angiography;  Surgeon: Jettie Booze, MD;  Location: [facility];  Service: Cardiovascular;  Laterality: N/A;   COLONOSCOPY WITH PROPOFOL N/A 02/11/2014   Procedure: COLONOSCOPY WITH PROPOFOL;  Surgeon: Cleotis Nipper, MD;  Location: [facility];  Service: Endoscopy;  Laterality: N/A;   KNEE ARTHROSCOPY Right 2013   torn meniscus repair   Patient Active Problem List   Diagnosis Date Noted   Mitral valve regurgitation 01/25/2022   Atrial tachycardia ([facility]) 09/15/2021   Pure hypercholesterolemia 09/15/2021   Primary osteoarthritis of right knee 04/28/2019   Acute lower UTI 09/04/2018   Prostatitis syndrome 09/04/2018   Hematuria 09/04/2018   BPH (benign prostatic hyperplasia) 09/04/2018   Sepsis secondary to UTI ([place]) 09/04/2018   UTI (urinary tract infection) 09/04/2018   Abnormal ECG during exercise stress test     PCP: Marrian Salvage, FNP  REFERRING PROVIDER: Mcarthur Rossetti, MD  REFERRING DIAG: M17.11 (ICD-10-CM) - Primary osteoarthritis of right knee M17.12  (ICD-10-CM) - Primary osteoarthritis of left knee  THERAPY DIAG:  Chronic pain of left knee  Cramp and spasm  Difficulty in walking, not elsewhere classified  Rationale for Evaluation and Treatment Rehabilitation  ONSET DATE: ~ 1 yr started difficulty going downstairs  SUBJECTIVE:   SUBJECTIVE STATEMENT: Pt reports mostly tightness than pain.  Was able to just lift foot up to put on socks this morning.  Definite difference with walking, no pain in L knee.  Still thinks he has swelling in L knee and plans to get it aspirated again.   PERTINENT HISTORY: Bil knee OA, Angina, Osteoporosis, Arthritis  PAIN:  Are you having pain? No  PRECAUTIONS: None  WEIGHT BEARING RESTRICTIONS No  FALLS:  Has patient fallen in last 6 months? No  LIVING ENVIRONMENT: Lives with: lives with their spouse Lives in: House/apartment Stairs: Yes: Internal: 12 steps; can reach both and External: 12 steps; can reach both Has following equipment at home: None  OCCUPATION: retired.  Plays golf, yard work  PLOF: Independent  PATIENT GOALS are there exercises that will help this knee relieve most of the pain so equal to R side   OBJECTIVE:   DIAGNOSTIC FINDINGS: XR on 02/09/2021 Left knee 2 views: Mild to moderate patellofemoral changes.  Medial joint  line with moderate to severe narrowing.  Lateral compartment overall  well-maintained.  No acute fractures knee is well located. Right knee 2  views: No acute fractures.  Knee is well located.  Moderate  to moderately severe narrowing medial joint line.  Mild to moderate  patellofemoral changes.  PATIENT SURVEYS:  FOTO knee 59%, predicted outcome 68% after 11 visits  COGNITION:  Overall cognitive status: Within functional limits for tasks assessed     SENSATION: WFL  EDEMA:  Circumferential: 48.5 cm R, 49 cm L (at joint line) 39 cm R, 40 cm L (proximal)  MUSCLE LENGTH: Hamstrings: WNL - SLR to 80 deg bil  Quadriceps - tightness bil,  prone knee bend to 90 deg, cramping in L hamstring  POSTURE: forward head  PALPATION: Tenderness patellar ligament inferior pole patella, increased swelling vastus lateralis  LOWER EXTREMITY ROM:  Active ROM Right eval Left eval Left 03/09/22  Knee flexion 125 108  117  Knee extension Lacking 2 deg  Lacking 6 deg 03/13/22- lacking 5 deg     LOWER EXTREMITY MMT:  MMT Right eval Left eval  Hip flexion 4+ 4+  Hip extension 4+ 4+  Hip internal rotation 5 5  Hip external rotation 5 5  Knee flexion 5 5  Knee extension 5 5     LOWER EXTREMITY SPECIAL TESTS:  Hip special tests: [name] (FABER) test: negative, Hip scouring test: negative  FUNCTIONAL TESTS:  5 times sit to stand: 9.2 seconds without UE assist  GAIT: Distance walked: 50 Assistive device utilized: None Level of assistance: Complete Independence Comments: antalgic, decreased stance time LLE   TODAY'S TREATMENT: 03/13/22 Therapeutic Exercise: to improve strength and mobility.  Demo, verbal and tactile cues throughout for technique. Bike L3 x 6 min  Step ups (2 risers) 2 x 10 bil  Side step downs (2 risers) 2 x 10 bil  Step downs (1 riser) 2 x 10 bil no UE support.  Squats 2 x 10  Forward T's - with support from chair x 10 bil  TKE - LLE x 15 RTB, x 10 using bal on wall.  Manual Therapy: to decrease muscle spasm and pain and improve mobility PA mobs tibia on femur Patellar mobs Cross friction to patellar ligament.   03/09/22 Therapeutic Exercise: Bike: L2x59mn Step ups (1 riser) no UE support 2x10 Step downs with (1 riser) no UE support 10x STS 10x no UE support LAQ no weight 10x with 3 sec hold at top Manual Therapy: IASTM with s/s tools to L quads L knee PROM into flexion L knee tibia on femur mobs grade I, II, III ( for pain and to increase ROM )  03/06/2022 Therapeutic Exercise: to improve strength and mobility.  Demo, verbal and tactile cues throughout for technique. Bike L2 x 6 min  Step ups  (1 riser) 2 x 10 bil Side step ups (1 riser) 2 x 10 bil   Heel taps (no riser) 2 x 10 bil Lunges 2 x 10 bil (using bolster) Eccentric heel raises x 10 bil  Balance board frontal plane x 20, 1 min hold, repeated sagittal plane.  Manual Therapy: to decrease muscle spasm and pain and improve mobility IASTM to L quad with massage stick, skilled palpation and monitoring with dry needling.  Trigger Point Dry-Needling  Treatment instructions: Expect mild to moderate muscle soreness.  Patient verbalized understanding of these instructions and education.  Patient Consent Given: Yes Education handout provided: Yes Muscles treated:  L quad (rectus and vastus lateralis) Electrical stimulation performed: No Parameters: N/A Treatment response/outcome: Twitch Response Elicited and Palpable Increase in Muscle Length     PATIENT  EDUCATION:  Education details: purpose and risks of TrDN Person educated: Patient Education method: Theatre stage manager Education comprehension: verbalized understanding   HOME EXERCISE PROGRAM: Access Code: [email or web address]  ASSESSMENT:  CLINICAL IMPRESSION: Pt. Is making good progress and reporting decreased knee pain.  Noted in standing continues to have L knee flexion, ROM is improving added TKE to exercises to address.  Reports most discomfort now at patellar ligament, decreased after cross friction massage.  He would benefit from continued skilled therapy.   OBJECTIVE IMPAIRMENTS Abnormal gait, difficulty walking, decreased ROM, decreased strength, increased edema, increased fascial restrictions, increased muscle spasms, and pain.   ACTIVITY LIMITATIONS bending, sitting, standing, and stairs  PARTICIPATION LIMITATIONS: community activity and yard work  PERSONAL FACTORS 1 comorbidity: bil knee OA  are also affecting patient's functional outcome.   REHAB POTENTIAL: Excellent  CLINICAL DECISION MAKING: Stable/uncomplicated  EVALUATION COMPLEXITY:  Low   GOALS: Goals reviewed with patient? Yes  SHORT TERM GOALS: Target date: 03/13/2022   Patient will be independent with initial HEP. Baseline: given Goal status: MET   LONG TERM GOALS: Target date: 04/10/2022   Patient will be independent with advanced/ongoing HEP to improve outcomes and carryover.  Baseline: needs progression Goal status: IN PROGRESS  2.  Patient will report at least 75% improvement in L knee pain to improve QOL. Baseline: constant ache Goal status: IN PROGRESS  3.  Patient will demonstrate improved L knee AROM to >/= 2-115 deg to allow for normal gait and stair mechanics. Baseline: 6-108 Goal status: IN PROGRESS (partially met - 03/09/22)  4.  Patient will be able to ambulate 600' with LRAD and normal gait pattern without increased pain to access community.  Baseline: antalgic Goal status: IN PROGRESS  5. Patient will be able to ascend/descend stairs with 1 HR and reciprocal step pattern without knee pain safely to access home and community.  Baseline: increased pain in L knee with descent Goal status: IN PROGRESS  6.  Patient will report 68% on FOTO  to demonstrate improved functional ability. Baseline: 59% Goal status: IN PROGRESS     PLAN: PT FREQUENCY: 1-2x/week  PT DURATION: 6 weeks  PLANNED INTERVENTIONS: Therapeutic exercises, Therapeutic activity, Neuromuscular re-education, Balance training, Gait training, Patient/Family education, Joint mobilization, Stair training, Dry Needling, Electrical stimulation, Cryotherapy, Moist heat, Taping, Ultrasound, Ionotophoresis 25m/ml Dexamethasone, and Manual therapy  PLAN FOR NEXT SESSION: continue quad/glute/HS strengthening, potential trial DN to hamstrings to improve knee extension.    ERennie Natter PT, DPT  03/13/2022, 11:04 AM

## 2022-03-16 ENCOUNTER — Ambulatory Visit: Payer: Medicare Other

## 2022-03-16 DIAGNOSIS — M1711 Unilateral primary osteoarthritis, right knee: Secondary | ICD-10-CM | POA: Diagnosis not present

## 2022-03-16 DIAGNOSIS — M1712 Unilateral primary osteoarthritis, left knee: Secondary | ICD-10-CM | POA: Diagnosis not present

## 2022-03-16 DIAGNOSIS — R252 Cramp and spasm: Secondary | ICD-10-CM | POA: Diagnosis not present

## 2022-03-16 DIAGNOSIS — M25562 Pain in left knee: Secondary | ICD-10-CM | POA: Diagnosis not present

## 2022-03-16 DIAGNOSIS — G8929 Other chronic pain: Secondary | ICD-10-CM | POA: Diagnosis not present

## 2022-03-16 DIAGNOSIS — R262 Difficulty in walking, not elsewhere classified: Secondary | ICD-10-CM

## 2022-03-20 ENCOUNTER — Ambulatory Visit: Payer: Medicare Other | Admitting: Physical Therapy

## 2022-03-20 ENCOUNTER — Encounter: Payer: Self-pay | Admitting: Physical Therapy

## 2022-03-20 DIAGNOSIS — R262 Difficulty in walking, not elsewhere classified: Secondary | ICD-10-CM

## 2022-03-20 DIAGNOSIS — G8929 Other chronic pain: Secondary | ICD-10-CM

## 2022-03-20 DIAGNOSIS — M25562 Pain in left knee: Secondary | ICD-10-CM | POA: Diagnosis not present

## 2022-03-20 DIAGNOSIS — R252 Cramp and spasm: Secondary | ICD-10-CM | POA: Diagnosis not present

## 2022-03-20 DIAGNOSIS — M1711 Unilateral primary osteoarthritis, right knee: Secondary | ICD-10-CM | POA: Diagnosis not present

## 2022-03-20 DIAGNOSIS — M1712 Unilateral primary osteoarthritis, left knee: Secondary | ICD-10-CM | POA: Diagnosis not present

## 2022-03-20 NOTE — Therapy (Signed)
OUTPATIENT PHYSICAL THERAPY TREATMENT   Patient Name: Brandon Acosta MRN: [id number] DOB:1941-01-24, 81 y.o., male Today's Date: 03/20/2022   PT End of Session - 03/20/22 0932     Visit Number 7    Number of Visits 12    Date for PT Re-Evaluation 04/10/22    Authorization Type Medicare + BCBS    PT Start Time 0933    PT Stop Time 1014    PT Time Calculation (min) 41 min    Activity Tolerance Patient tolerated treatment well    Behavior During Therapy [facility] for tasks assessed/performed                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[facility])    Arthritis    Diverticulitis    GERD (gastroesophageal reflux disease)    Hyperlipemia    Hyperlipidemia    Osteoporosis    Palpitations    Past Surgical History:  Procedure Laterality Date   APPENDECTOMY  as child   CARDIAC CATHETERIZATION N/A 10/16/2016   Procedure: Left Heart Cath and Coronary Angiography;  Surgeon: Corky Crafts, MD;  Location: [facility];  Service: Cardiovascular;  Laterality: N/A;   COLONOSCOPY WITH PROPOFOL N/A 02/11/2014   Procedure: COLONOSCOPY WITH PROPOFOL;  Surgeon: Florencia Reasons, MD;  Location: [facility];  Service: Endoscopy;  Laterality: N/A;   KNEE ARTHROSCOPY Right 2013   torn meniscus repair   Patient Active Problem List   Diagnosis Date Noted   Mitral valve regurgitation 01/25/2022   Atrial tachycardia ([facility]) 09/15/2021   Pure hypercholesterolemia 09/15/2021   Primary osteoarthritis of right knee 04/28/2019   Acute lower UTI 09/04/2018   Prostatitis syndrome 09/04/2018   Hematuria 09/04/2018   BPH (benign prostatic hyperplasia) 09/04/2018   Sepsis secondary to UTI ([facility]) 09/04/2018   UTI (urinary tract infection) 09/04/2018   Abnormal ECG during exercise stress test     PCP: Olive Bass, FNP  REFERRING PROVIDER: Kathryne Hitch, MD  REFERRING DIAG: M17.11 (ICD-10-CM) - Primary osteoarthritis of right knee M17.12  (ICD-10-CM) - Primary osteoarthritis of left knee  THERAPY DIAG:  Chronic pain of left knee  Cramp and spasm  Difficulty in walking, not elsewhere classified  Rationale for Evaluation and Treatment Rehabilitation  ONSET DATE: ~ 1 yr started difficulty going downstairs  SUBJECTIVE:   SUBJECTIVE STATEMENT: Ache more than pain. Did my HEP this morning. Did stairs reciprocally today. Haven't been able to walk a mile yet.  PERTINENT HISTORY: Bil knee OA, Angina, Osteoporosis, Arthritis  PAIN:  Are you having pain? No  PRECAUTIONS: None  WEIGHT BEARING RESTRICTIONS No  FALLS:  Has patient fallen in last 6 months? No  LIVING ENVIRONMENT: Lives with: lives with their spouse Lives in: House/apartment Stairs: Yes: Internal: 12 steps; can reach both and External: 12 steps; can reach both Has following equipment at home: None  OCCUPATION: retired.  Plays golf, yard work  PLOF: Independent  PATIENT GOALS are there exercises that will help this knee relieve most of the pain so equal to R side   OBJECTIVE:   DIAGNOSTIC FINDINGS: XR on 02/09/2021 Left knee 2 views: Mild to moderate patellofemoral changes.  Medial joint  line with moderate to severe narrowing.  Lateral compartment overall  well-maintained.  No acute fractures knee is well located. Right knee 2 views: No acute fractures.  Knee is well located.  Moderate  to moderately severe narrowing medial joint line.  Mild to moderate  patellofemoral changes.  PATIENT SURVEYS:  FOTO knee 59%, predicted outcome 68% after 11 visits  COGNITION:  Overall cognitive status: Within functional limits for tasks assessed     SENSATION: WFL  EDEMA:  Circumferential: 48.5 cm R, 49 cm L (at joint line) 39 cm R, 40 cm L (proximal)  MUSCLE LENGTH: Hamstrings: WNL - SLR to 80 deg bil  Quadriceps - tightness bil, prone knee bend to 90 deg, cramping in L hamstring  POSTURE: forward head  PALPATION: Tenderness patellar ligament  inferior pole patella, increased swelling vastus lateralis  LOWER EXTREMITY ROM:  Active ROM Right eval Left eval Left 03/09/22  Knee flexion 125 108  117  Knee extension Lacking 2 deg  Lacking 6 deg 03/13/22- lacking 5 deg     LOWER EXTREMITY MMT:  MMT Right eval Left eval  Hip flexion 4+ 4+  Hip extension 4+ 4+  Hip internal rotation 5 5  Hip external rotation 5 5  Knee flexion 5 5  Knee extension 5 5     LOWER EXTREMITY SPECIAL TESTS:  Hip special tests: Luisa Hart (FABER) test: negative, Hip scouring test: negative  FUNCTIONAL TESTS:  5 times sit to stand: 9.2 seconds without UE assist  GAIT: Distance walked: 50 Assistive device utilized: None Level of assistance: Complete Independence Comments: antalgic, decreased stance time LLE   TODAY'S TREATMENT: 03/20/22 Sit to stand x 10, then with RTB around thighs x 10 Functional squats to mat + foam pad 2x 10 cues to WB equally            Heel taps 2 risers x 10 fwd and laterally   Sidestepping GTB and RTB 4 x 20 ft   Monster walk GTB fwd/bwd x 4   Standing hip ext GTB x 10 ea, ABD RTB x 10 ea (difficult standing on L)   SDLY ABD RTB x 10 bil   Step ups 2 risers x 10 bil   Seated fig 4 x 20 sec ea  03/16/22 Therapeutic Exercise: Recumbent Bike L3x58min L SLR with ER 2x10 STS no UE support 10 reps Fwd step downs on 6' step 10 reps Single leg squats with opp leg in front 10 reps bil Knee flexion 20# 10 reps BLE; 10# 10 reps LLE Knee extension 15# 10 reps BLE; 10# 10 reps LLE  Manual Therapy: STM to L lateral patellar tendon and sustained pressure with knee mobilization 03/13/22 Therapeutic Exercise: to improve strength and mobility.  Demo, verbal and tactile cues throughout for technique. Bike L3 x 6 min  Step ups (2 risers) 2 x 10 bil  Side step downs (2 risers) 2 x 10 bil  Step downs (1 riser) 2 x 10 bil no UE support.  Squats 2 x 10  Forward T's - with support from chair x 10 bil  TKE - LLE x 15 RTB, x 10  using bal on wall.  Manual Therapy: to decrease muscle spasm and pain and improve mobility PA mobs tibia on femur Patellar mobs Cross friction to patellar ligament.   03/09/22 Therapeutic Exercise: Bike: L2x5min Step ups (1 riser) no UE support 2x10 Step downs with (1 riser) no UE support 10x STS 10x no UE support LAQ no weight 10x with 3 sec hold at top Manual Therapy: IASTM with s/s tools to L quads L knee PROM into flexion L knee tibia on femur mobs grade I, II, III ( for pain and to increase ROM )   PATIENT EDUCATION:  Education details: HEP update 03/20/22 Person educated:  Patient Education method: Explanation and Handouts Education comprehension: verbalized understanding   HOME EXERCISE PROGRAM: Access Code: [email or web address]  ASSESSMENT:  CLINICAL IMPRESSION: Hovhannes demonstrates good eccentric control with heel taps but glute med weakness on left. We worked on Engineering geologist today as he had already completed his HEP before his appt. He demonstrates some hip ADDuction with sit to stands and decreased weight shift left with them as well. Glute med strengthening was challenging.   OBJECTIVE IMPAIRMENTS Abnormal gait, difficulty walking, decreased ROM, decreased strength, increased edema, increased fascial restrictions, increased muscle spasms, and pain.   ACTIVITY LIMITATIONS bending, sitting, standing, and stairs  PARTICIPATION LIMITATIONS: community activity and yard work  PERSONAL FACTORS 1 comorbidity: bil knee OA  are also affecting patient's functional outcome.   REHAB POTENTIAL: Excellent  CLINICAL DECISION MAKING: Stable/uncomplicated  EVALUATION COMPLEXITY: Low   GOALS: Goals reviewed with patient? Yes  SHORT TERM GOALS: Target date: 03/13/2022   Patient will be independent with initial HEP. Baseline: given Goal status: MET   LONG TERM GOALS: Target date: 04/10/2022   Patient will be independent with advanced/ongoing HEP to improve outcomes and  carryover.  Baseline: needs progression Goal status: IN PROGRESS  2.  Patient will report at least 75% improvement in L knee pain to improve QOL. Baseline: constant ache Goal status: IN PROGRESS  3.  Patient will demonstrate improved L knee AROM to >/= 2-115 deg to allow for normal gait and stair mechanics. Baseline: 6-108 Goal status: IN PROGRESS (partially met - 03/09/22)  4.  Patient will be able to ambulate 600' with LRAD and normal gait pattern without increased pain to access community.  Baseline: antalgic Goal status: IN PROGRESS  5. Patient will be able to ascend/descend stairs with 1 HR and reciprocal step pattern without knee pain safely to access home and community.  Baseline: increased pain in L knee with descent Goal status: IN PROGRESS  6.  Patient will report 68% on FOTO  to demonstrate improved functional ability. Baseline: 59% Goal status: IN PROGRESS     PLAN: PT FREQUENCY: 1-2x/week  PT DURATION: 6 weeks  PLANNED INTERVENTIONS: Therapeutic exercises, Therapeutic activity, Neuromuscular re-education, Balance training, Gait training, Patient/Family education, Joint mobilization, Stair training, Dry Needling, Electrical stimulation, Cryotherapy, Moist heat, Taping, Ultrasound, Ionotophoresis 4mg /ml Dexamethasone, and Manual therapy  PLAN FOR NEXT SESSION: continue quad/glute/HS strengthening, potential trial DN to hamstrings to improve knee extension.    Veleda Mun, PT  03/20/2022, 3:10 PM

## 2022-03-23 ENCOUNTER — Encounter: Payer: Self-pay | Admitting: Physical Therapy

## 2022-03-23 ENCOUNTER — Ambulatory Visit: Payer: Medicare Other | Admitting: Physical Therapy

## 2022-03-23 DIAGNOSIS — R262 Difficulty in walking, not elsewhere classified: Secondary | ICD-10-CM

## 2022-03-23 DIAGNOSIS — R252 Cramp and spasm: Secondary | ICD-10-CM | POA: Diagnosis not present

## 2022-03-23 DIAGNOSIS — G8929 Other chronic pain: Secondary | ICD-10-CM

## 2022-03-23 DIAGNOSIS — M1711 Unilateral primary osteoarthritis, right knee: Secondary | ICD-10-CM | POA: Diagnosis not present

## 2022-03-23 DIAGNOSIS — M1712 Unilateral primary osteoarthritis, left knee: Secondary | ICD-10-CM | POA: Diagnosis not present

## 2022-03-23 DIAGNOSIS — M25562 Pain in left knee: Secondary | ICD-10-CM | POA: Diagnosis not present

## 2022-03-23 NOTE — Therapy (Signed)
OUTPATIENT PHYSICAL THERAPY TREATMENT   Patient Name: Brandon Acosta MRN: [id number] DOB:May 12, 1941, 81 y.o., male Today's Date: 03/23/2022   PT End of Session - 03/23/22 0936     Visit Number 8    Number of Visits 12    Date for PT Re-Evaluation 04/10/22    Authorization Type Medicare + BCBS    PT Start Time 0930    PT Stop Time 1015    PT Time Calculation (min) 45 min    Activity Tolerance Patient tolerated treatment well    Behavior During Therapy WFL for tasks assessed/performed                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[place])    Arthritis    Diverticulitis    GERD (gastroesophageal reflux disease)    Hyperlipemia    Hyperlipidemia    Osteoporosis    Palpitations    Past Surgical History:  Procedure Laterality Date   APPENDECTOMY  as child   CARDIAC CATHETERIZATION N/A 10/16/2016   Procedure: Left Heart Cath and Coronary Angiography;  Surgeon: Brandon Booze, MD;  Location: [facility];  Service: Cardiovascular;  Laterality: N/A;   COLONOSCOPY WITH PROPOFOL N/A 02/11/2014   Procedure: COLONOSCOPY WITH PROPOFOL;  Surgeon: Brandon Nipper, MD;  Location: [facility];  Service: Endoscopy;  Laterality: N/A;   KNEE ARTHROSCOPY Right 2013   torn meniscus repair   Patient Active Problem List   Diagnosis Date Noted   Mitral valve regurgitation 01/25/2022   Atrial tachycardia ([place]) 09/15/2021   Pure hypercholesterolemia 09/15/2021   Primary osteoarthritis of right knee 04/28/2019   Acute lower UTI 09/04/2018   Prostatitis syndrome 09/04/2018   Hematuria 09/04/2018   BPH (benign prostatic hyperplasia) 09/04/2018   Sepsis secondary to UTI ([place]) 09/04/2018   UTI (urinary tract infection) 09/04/2018   Abnormal ECG during exercise stress test     PCP: Brandon Salvage, FNP  REFERRING PROVIDER: Mcarthur Rossetti, MD  REFERRING DIAG: M17.11 (ICD-10-CM) - Primary osteoarthritis of right knee M17.12  (ICD-10-CM) - Primary osteoarthritis of left knee  THERAPY DIAG:  Chronic pain of left knee  Cramp and spasm  Difficulty in walking, not elsewhere classified  Rationale for Evaluation and Treatment Rehabilitation  ONSET DATE: ~ 1 yr started difficulty going downstairs  SUBJECTIVE:   SUBJECTIVE STATEMENT: Ache more than pain. Did my HEP this morning. Did stairs reciprocally today. Haven't been able to walk a mile yet.  PERTINENT HISTORY: Bil knee OA, Angina, Osteoporosis, Arthritis  PAIN:  Are you having pain? Yes: NPRS scale: 1/10 Pain location: L knee Pain description: ache Aggravating factors:   Relieving factors:    PRECAUTIONS: None  WEIGHT BEARING RESTRICTIONS No  FALLS:  Has patient fallen in last 6 months? No  LIVING ENVIRONMENT: Lives with: lives with their spouse Lives in: House/apartment Stairs: Yes: Internal: 12 steps; can reach both and External: 12 steps; can reach both Has following equipment at home: None  OCCUPATION: retired.  Plays golf, yard work  PLOF: Independent  PATIENT GOALS are there exercises that will help this knee relieve most of the pain so equal to R side   OBJECTIVE:   DIAGNOSTIC FINDINGS: XR on 02/09/2021 Left knee 2 views: Mild to moderate patellofemoral changes.  Medial joint  line with moderate to severe narrowing.  Lateral compartment overall  well-maintained.  No acute fractures knee is well located. Right knee 2 views: No acute fractures.  Knee is well  located.  Moderate  to moderately severe narrowing medial joint line.  Mild to moderate  patellofemoral changes.  PATIENT SURVEYS:  FOTO knee 59%, predicted outcome 68% after 11 visits  COGNITION:  Overall cognitive status: Within functional limits for tasks assessed     SENSATION: WFL  EDEMA:  Circumferential: 48.5 cm R, 49 cm L (at joint line) 39 cm R, 40 cm L (proximal)  MUSCLE LENGTH: Hamstrings: WNL - SLR to 80 deg bil  Quadriceps - tightness bil, prone  knee bend to 90 deg, cramping in L hamstring  POSTURE: forward head  PALPATION: Tenderness patellar ligament inferior pole patella, increased swelling vastus lateralis  LOWER EXTREMITY ROM:  Active ROM Right eval Left eval Left 03/09/22  Knee flexion 125 108  117  Knee extension Lacking 2 deg  Lacking 6 deg 03/13/22- lacking 5 deg     LOWER EXTREMITY MMT:  MMT Right eval Left eval  Hip flexion 4+ 4+  Hip extension 4+ 4+  Hip internal rotation 5 5  Hip external rotation 5 5  Knee flexion 5 5  Knee extension 5 5     LOWER EXTREMITY SPECIAL TESTS:  Hip special tests: Brandon Acosta (FABER) test: negative, Hip scouring test: negative  FUNCTIONAL TESTS:  5 times sit to stand: 9.2 seconds without UE assist  GAIT: Distance walked: 50 Assistive device utilized: None Level of assistance: Complete Independence Comments: antalgic, decreased stance time LLE   TODAY'S TREATMENT: 03/23/22 Therapeutic Exercise: to improve strength and mobility.  Bike L3 x 6 min  Cybex knee extension 25# x 15, 35# x 10 Cybex knee flexion 35# 2 x 10  Cybex leg press 25# 2 x 10 Cybex toe press 25# 2 x 10  Sit to stands GTB around thighs 3 x 10  Start excursion pattern - taps 180 deg front to back x 5 each leg, SBA for safety. Lunges forward x 10 bil, side x 10 bil, and backwards x 10 bil with foot on slider, cues for posture.  Manual Therapy: to decrease muscle spasm and pain and improve mobility.  STM/TPR L vastus, skilled palpation and monitoring during dry needling. Trigger Point Dry-Needling  Treatment instructions: Expect mild to moderate muscle soreness.Patient verbalized understanding of these instructions and education.  Patient Consent Given: Yes Education handout provided: Previously provided Muscles treated: L vastus lateralis Treatment response/outcome: Twitch Response Elicited and Palpable Increase in Muscle Length    03/20/22 Sit to stand x 10, then with RTB around thighs x  10 Functional squats to mat + foam pad 2x 10 cues to WB equally            Heel taps 2 risers x 10 fwd and laterally   Sidestepping GTB and RTB 4 x 20 ft   Monster walk GTB fwd/bwd x 4   Standing hip ext GTB x 10 ea, ABD RTB x 10 ea (difficult standing on L)   SDLY ABD RTB x 10 bil   Step ups 2 risers x 10 bil   Seated fig 4 x 20 sec ea  03/16/22 Therapeutic Exercise: Recumbent Bike L3x42mn L SLR with ER 2x10 STS no UE support 10 reps Fwd step downs on 6' step 10 reps Single leg squats with opp leg in front 10 reps bil Knee flexion 20# 10 reps BLE; 10# 10 reps LLE Knee extension 15# 10 reps BLE; 10# 10 reps LLE  Manual Therapy: STM to L lateral patellar tendon and sustained pressure with knee mobilization 03/13/22 Therapeutic Exercise: to  improve strength and mobility.  Demo, verbal and tactile cues throughout for technique. Bike L3 x 6 min  Step ups (2 risers) 2 x 10 bil  Side step downs (2 risers) 2 x 10 bil  Step downs (1 riser) 2 x 10 bil no UE support.  Squats 2 x 10  Forward T's - with support from chair x 10 bil  TKE - LLE x 15 RTB, x 10 using bal on wall.  Manual Therapy: to decrease muscle spasm and pain and improve mobility PA mobs tibia on femur Patellar mobs Cross friction to patellar ligament.   03/09/22 Therapeutic Exercise: Bike: L2x23mn Step ups (1 riser) no UE support 2x10 Step downs with (1 riser) no UE support 10x STS 10x no UE support LAQ no weight 10x with 3 sec hold at top Manual Therapy: IASTM with s/s tools to L quads L knee PROM into flexion L knee tibia on femur mobs grade I, II, III ( for pain and to increase ROM )   PATIENT EDUCATION:  Education details: HEP update 03/23/22 Person educated: Patient Education method: Explanation and Handouts Education comprehension: verbalized understanding   HOME EXERCISE PROGRAM: Access Code: [email or web address]  ASSESSMENT:  CLINICAL IMPRESSION: WUvaldoreports some soreness after performing advanced HEP and  more muscle cramps, recommended performing 3x/week with rest break between, especially on days golfing.  He reports he not longer needs to where leg brace golfing and while knees are sore at end of 18 holes, not as painful.  Progressed exercises today with more resistance with machines and balance exercises for hip strengthening.  Noted trigger point in L vastus, agreed to dry needling again, reported decreased tightness following.  WMick Tangumacontinues to demonstrate potential for improvement and would benefit from continued skilled therapy to address impairments.      OBJECTIVE IMPAIRMENTS Abnormal gait, difficulty walking, decreased ROM, decreased strength, increased edema, increased fascial restrictions, increased muscle spasms, and pain.   ACTIVITY LIMITATIONS bending, sitting, standing, and stairs  PARTICIPATION LIMITATIONS: community activity and yard work  PERSONAL FACTORS 1 comorbidity: bil knee OA  are also affecting patient's functional outcome.   REHAB POTENTIAL: Excellent  CLINICAL DECISION MAKING: Stable/uncomplicated  EVALUATION COMPLEXITY: Low   GOALS: Goals reviewed with patient? Yes  SHORT TERM GOALS: Target date: 03/13/2022   Patient will be independent with initial HEP. Baseline: given Goal status: MET   LONG TERM GOALS: Target date: 04/10/2022   Patient will be independent with advanced/ongoing HEP to improve outcomes and carryover.  Baseline: needs progression Goal status: IN PROGRESS  2.  Patient will report at least 75% improvement in L knee pain to improve QOL. Baseline: constant ache Goal status: IN PROGRESS  3.  Patient will demonstrate improved L knee AROM to >/= 2-115 deg to allow for normal gait and stair mechanics. Baseline: 6-108 Goal status: IN PROGRESS (partially met - 03/09/22)  4.  Patient will be able to ambulate 600' with LRAD and normal gait pattern without increased pain to access community.  Baseline: antalgic Goal status: IN  PROGRESS  5. Patient will be able to ascend/descend stairs with 1 HR and reciprocal step pattern without knee pain safely to access home and community.  Baseline: increased pain in L knee with descent Goal status: IN PROGRESS  6.  Patient will report 68% on FOTO  to demonstrate improved functional ability. Baseline: 59% Goal status: IN PROGRESS     PLAN: PT FREQUENCY: 1-2x/week  PT DURATION: 6 weeks  PLANNED INTERVENTIONS: Therapeutic exercises,  Therapeutic activity, Neuromuscular re-education, Balance training, Gait training, Patient/Family education, Joint mobilization, Stair training, Dry Needling, Electrical stimulation, Cryotherapy, Moist heat, Taping, Ultrasound, Ionotophoresis 36m/ml Dexamethasone, and Manual therapy  PLAN FOR NEXT SESSION: continue quad/glute/HS strengthening, potential trial DN to hamstrings to improve knee extension.    ERennie Natter PT , DPT  03/23/2022, 10:23 AM

## 2022-03-26 ENCOUNTER — Ambulatory Visit: Payer: Medicare Other | Attending: Orthopaedic Surgery | Admitting: Physical Therapy

## 2022-03-26 ENCOUNTER — Ambulatory Visit (INDEPENDENT_AMBULATORY_CARE_PROVIDER_SITE_OTHER): Payer: Medicare Other | Admitting: Physician Assistant

## 2022-03-26 ENCOUNTER — Encounter: Payer: Self-pay | Admitting: Physician Assistant

## 2022-03-26 ENCOUNTER — Encounter: Payer: Self-pay | Admitting: Physical Therapy

## 2022-03-26 DIAGNOSIS — M1712 Unilateral primary osteoarthritis, left knee: Secondary | ICD-10-CM | POA: Diagnosis not present

## 2022-03-26 DIAGNOSIS — M25562 Pain in left knee: Secondary | ICD-10-CM | POA: Insufficient documentation

## 2022-03-26 DIAGNOSIS — R252 Cramp and spasm: Secondary | ICD-10-CM | POA: Insufficient documentation

## 2022-03-26 DIAGNOSIS — R262 Difficulty in walking, not elsewhere classified: Secondary | ICD-10-CM | POA: Insufficient documentation

## 2022-03-26 DIAGNOSIS — G8929 Other chronic pain: Secondary | ICD-10-CM | POA: Insufficient documentation

## 2022-03-26 NOTE — Progress Notes (Signed)
HPI: Mr. Brandon Acosta comes in today due to left knee swelling.  He feels that the injection aspiration by Dr. Ninfa Linden gave them on 01/24/2022 was helpful but also found therapy to be very helpful and the dry needling they performed.  He notes that he has had some swelling in the knee again that started in mid to late June.  He is wondering if he can have this aspirated again today.  No new injury.  Review of systems see HPI otherwise negative or noncontributory.  Physical exam: Left knee slight effusion.  No abnormal warmth erythema.  Full extension flexion to just beyond 90 degrees. Left knee is aspirated and 13 cc of bloody aspirate obtained.  Patient tolerates well.  Impression: Left knee osteoarthritis  Plan: He will continue to work on strengthening the left knee.  Follow-up with [place] as needed.  Questions encouraged and answered at length.

## 2022-03-26 NOTE — Therapy (Signed)
OUTPATIENT PHYSICAL THERAPY TREATMENT   Patient Name: Brandon Acosta MRN: [id number] DOB:24-Dec-1940, 81 y.o., male Today's Date: 03/26/2022   PT End of Session - 03/26/22 0933     Visit Number 9    Number of Visits 12    Date for PT Re-Evaluation 04/10/22    Authorization Type Medicare + BCBS    PT Start Time [phone]    PT Stop Time 1015    PT Time Calculation (min) 44 min    Activity Tolerance Patient tolerated treatment well    Behavior During Therapy [facility] for tasks assessed/performed                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[place])    Arthritis    Diverticulitis    GERD (gastroesophageal reflux disease)    Hyperlipemia    Hyperlipidemia    Osteoporosis    Palpitations    Past Surgical History:  Procedure Laterality Date   APPENDECTOMY  as child   CARDIAC CATHETERIZATION N/A 10/16/2016   Procedure: Left Heart Cath and Coronary Angiography;  Surgeon: Jettie Booze, MD;  Location: [facility];  Service: Cardiovascular;  Laterality: N/A;   COLONOSCOPY WITH PROPOFOL N/A 02/11/2014   Procedure: COLONOSCOPY WITH PROPOFOL;  Surgeon: Cleotis Nipper, MD;  Location[facility];  Service: Endoscopy;  Laterality: N/A;   KNEE ARTHROSCOPY Right 2013   torn meniscus repair   Patient Active Problem List   Diagnosis Date Noted   Mitral valve regurgitation 01/25/2022   Atrial tachycardia ([place]) 09/15/2021   Pure hypercholesterolemia 09/15/2021   Primary osteoarthritis of right knee 04/28/2019   Acute lower UTI 09/04/2018   Prostatitis syndrome 09/04/2018   Hematuria 09/04/2018   BPH (benign prostatic hyperplasia) 09/04/2018   Sepsis secondary to UTI ([place]) 09/04/2018   UTI (urinary tract infection) 09/04/2018   Abnormal ECG during exercise stress test     PCP: Marrian Salvage, FNP  REFERRING PROVIDER: Mcarthur Rossetti, MD  REFERRING DIAG: M17.11 (ICD-10-CM) - Primary osteoarthritis of right knee M17.12  (ICD-10-CM) - Primary osteoarthritis of left knee  THERAPY DIAG:  Chronic pain of left knee  Cramp and spasm  Difficulty in walking, not elsewhere classified  Rationale for Evaluation and Treatment Rehabilitation  ONSET DATE: ~ 1 yr started difficulty going downstairs  SUBJECTIVE:   SUBJECTIVE STATEMENT: Pt. Reports "muscles I haven't felt before since all the work with the straps".  Some tightness/stiffness.  But this morning able to put shoes and socks on L by lifting up leg instead of crossing foot over leg.  Sat for 2 hours playing bridge gets achy still in that knee, but if extends it not bad.  Swelling isn't as bad, has appt. With orthopedist this afternoon.   PERTINENT HISTORY: Bil knee OA, Angina, Osteoporosis, Arthritis  PAIN:  Are you having pain? Yes: NPRS scale: 1/10 Pain location: L knee Pain description: ache Aggravating factors:   Relieving factors:    PRECAUTIONS: None  WEIGHT BEARING RESTRICTIONS No  FALLS:  Has patient fallen in last 6 months? No  LIVING ENVIRONMENT: Lives with: lives with their spouse Lives in: House/apartment Stairs: Yes: Internal: 12 steps; can reach both and External: 12 steps; can reach both Has following equipment at home: None  OCCUPATION: retired.  Plays golf, yard work  PLOF: Independent  PATIENT GOALS are there exercises that will help this knee relieve most of the pain so equal to R side   OBJECTIVE:  DIAGNOSTIC FINDINGS: XR on 02/09/2021 Left knee 2 views: Mild to moderate patellofemoral changes.  Medial joint  line with moderate to severe narrowing.  Lateral compartment overall  well-maintained.  No acute fractures knee is well located. Right knee 2 views: No acute fractures.  Knee is well located.  Moderate  to moderately severe narrowing medial joint line.  Mild to moderate  patellofemoral changes.  PATIENT SURVEYS:  FOTO knee 59%, predicted outcome 68% after 11 visits  COGNITION:  Overall cognitive  status: Within functional limits for tasks assessed     SENSATION: WFL  EDEMA:  Circumferential: 48.5 cm R, 49 cm L (at joint line) 39 cm R, 40 cm L (proximal)  MUSCLE LENGTH: Hamstrings: WNL - SLR to 80 deg bil  Quadriceps - tightness bil, prone knee bend to 90 deg, cramping in L hamstring  POSTURE: forward head  PALPATION: Tenderness patellar ligament inferior pole patella, increased swelling vastus lateralis  LOWER EXTREMITY ROM:  Active ROM Right eval Left eval Left 03/09/22  Knee flexion 125 108  117  Knee extension Lacking 2 deg  Lacking 6 deg 03/13/22- lacking 5 deg     LOWER EXTREMITY MMT:  MMT Right eval Left eval  Hip flexion 4+ 4+  Hip extension 4+ 4+  Hip internal rotation 5 5  Hip external rotation 5 5  Knee flexion 5 5  Knee extension 5 5     LOWER EXTREMITY SPECIAL TESTS:  Hip special tests: [name] (FABER) test: negative, Hip scouring test: negative  FUNCTIONAL TESTS:  5 times sit to stand: 9.2 seconds without UE assist  GAIT: Distance walked: 50 Assistive device utilized: None Level of assistance: Complete Independence Comments: antalgic, decreased stance time LLE   TODAY'S TREATMENT: 03/26/2022 Therapeutic Exercise: to improve strength and mobility.  Demo, verbal and tactile cues throughout for technique. Bike L3 x 6 min  Cybex knee extension 35# 2 x 10 Cybex knee flexion 35# 2 x 10  Start excursion pattern - taps 360 deg x 3 each leg, SBA for safety. Lunges forward 2 x 10 bil, side 2 x 10 bil, and backwards 2 x 10 bil with foot on slider, cues for posture.  Seated hip flexion step overs x 20 bil Seated hamstring stretches post manual therapy.   Manual Therapy: to improve mobility STM/TPR to bil lateral hamstrings Trigger Point Dry-Needling  Treatment instructions: Expect mild to moderate muscle soreness. Patient verbalized understanding of these instructions and education.  Patient Consent Given: Yes Education handout provided:  Previously provided Muscles treated: bil lateral hamstrings Treatment response/outcome: Twitch Response Elicited and Palpable Increase in Muscle Length   03/23/22 Therapeutic Exercise: to improve strength and mobility.  Bike L3 x 6 min  Cybex knee extension 25# x 15, 35# x 10 Cybex knee flexion 35# 2 x 10  Cybex leg press 25# 2 x 10 Cybex toe press 25# 2 x 10  Sit to stands GTB around thighs 3 x 10  Start excursion pattern - taps 180 deg front to back x 5 each leg, SBA for safety. Lunges forward x 10 bil, side x 10 bil, and backwards x 10 bil with foot on slider, cues for posture.  Manual Therapy: to decrease muscle spasm and pain and improve mobility.  STM/TPR L vastus, skilled palpation and monitoring during dry needling. Trigger Point Dry-Needling  Treatment instructions: Expect mild to moderate muscle soreness.Patient verbalized understanding of these instructions and education.  Patient Consent Given: Yes Education handout provided: Previously provided Muscles treated: L  vastus lateralis Treatment response/outcome: Twitch Response Elicited and Palpable Increase in Muscle Length  03/20/22 Sit to stand x 10, then with RTB around thighs x 10 Functional squats to mat + foam pad 2x 10 cues to WB equally            Heel taps 2 risers x 10 fwd and laterally   Sidestepping GTB and RTB 4 x 20 ft   Monster walk GTB fwd/bwd x 4   Standing hip ext GTB x 10 ea, ABD RTB x 10 ea (difficult standing on L)   SDLY ABD RTB x 10 bil   Step ups 2 risers x 10 bil   Seated fig 4 x 20 sec ea   PATIENT EDUCATION:  Education details: HEP update 03/23/22 Person educated: Patient Education method: Explanation and Handouts Education comprehension: verbalized understanding   HOME EXERCISE PROGRAM: Access Code: [email or web address]  ASSESSMENT:  CLINICAL IMPRESSION: Brandon Acosta is making good progress, reporting less pain and more mobility in L knee and hip.  He tolerated progression of exercises well  today without report of pain.  He felt that dry needling has been very beneficial, today manual therapy/dry needing focused on bil hamstrings, as still not getting full knee extension bil with gait.  Brandon Acosta continues to demonstrate potential for improvement and would benefit from continued skilled therapy to address impairments.      OBJECTIVE IMPAIRMENTS Abnormal gait, difficulty walking, decreased ROM, decreased strength, increased edema, increased fascial restrictions, increased muscle spasms, and pain.   ACTIVITY LIMITATIONS bending, sitting, standing, and stairs  PARTICIPATION LIMITATIONS: community activity and yard work  PERSONAL FACTORS 1 comorbidity: bil knee OA  are also affecting patient's functional outcome.   REHAB POTENTIAL: Excellent  CLINICAL DECISION MAKING: Stable/uncomplicated  EVALUATION COMPLEXITY: Low   GOALS: Goals reviewed with patient? Yes  SHORT TERM GOALS: Target date: 03/13/2022   Patient will be independent with initial HEP. Baseline: given Goal status: MET   LONG TERM GOALS: Target date: 04/10/2022   Patient will be independent with advanced/ongoing HEP to improve outcomes and carryover.  Baseline: needs progression Goal status: IN PROGRESS  2.  Patient will report at least 75% improvement in L knee pain to improve QOL. Baseline: constant ache Goal status: IN PROGRESS  3.  Patient will demonstrate improved L knee AROM to >/= 2-115 deg to allow for normal gait and stair mechanics. Baseline: 6-108 Goal status: IN PROGRESS (partially met - 03/09/22)  4.  Patient will be able to ambulate 600' with LRAD and normal gait pattern without increased pain to access community.  Baseline: antalgic Goal status: IN PROGRESS  5. Patient will be able to ascend/descend stairs with 1 HR and reciprocal step pattern without knee pain safely to access home and community.  Baseline: increased pain in L knee with descent Goal status: IN PROGRESS  6.   Patient will report 68% on FOTO  to demonstrate improved functional ability. Baseline: 59% Goal status: IN PROGRESS     PLAN: PT FREQUENCY: 1-2x/week  PT DURATION: 6 weeks  PLANNED INTERVENTIONS: Therapeutic exercises, Therapeutic activity, Neuromuscular re-education, Balance training, Gait training, Patient/Family education, Joint mobilization, Stair training, Dry Needling, Electrical stimulation, Cryotherapy, Moist heat, Taping, Ultrasound, Ionotophoresis 71m/ml Dexamethasone, and Manual therapy  PLAN FOR NEXT SESSION: continue quad/glute/HS strengthening, HS stretches.  Progress note/recert.   ERennie Natter PT , DPT  03/26/2022, 11:59 AM

## 2022-04-04 ENCOUNTER — Ambulatory Visit: Payer: Medicare Other | Admitting: Physical Therapy

## 2022-04-05 ENCOUNTER — Encounter: Payer: Self-pay | Admitting: Physical Therapy

## 2022-04-05 ENCOUNTER — Ambulatory Visit: Payer: Medicare Other | Admitting: Physical Therapy

## 2022-04-05 DIAGNOSIS — R252 Cramp and spasm: Secondary | ICD-10-CM

## 2022-04-05 DIAGNOSIS — G8929 Other chronic pain: Secondary | ICD-10-CM | POA: Diagnosis not present

## 2022-04-05 DIAGNOSIS — R262 Difficulty in walking, not elsewhere classified: Secondary | ICD-10-CM

## 2022-04-05 DIAGNOSIS — M25562 Pain in left knee: Secondary | ICD-10-CM | POA: Diagnosis not present

## 2022-04-05 NOTE — Therapy (Addendum)
OUTPATIENT PHYSICAL THERAPY TREATMENT PHYSICAL THERAPY DISCHARGE SUMMARY  Visits from Start of Care: 10  Current functional level related to goals / functional outcomes: Significant improvement in knee pain and functional ability.  FOTO improved to 67%.    Remaining deficits: Lacking full L knee extension   Education / Equipment: HEP  Plan: Patient agrees to discharge.  Patient is being discharged due to meeting the stated rehab goals.  He was placed on 30 day hold on 04/05/22 because of progress and has not needed to return to skilled therapy during that time frame.    Rennie Natter, PT, DPT 2:11 PM 05/10/2022    Patient Name: Brandon Acosta MRN: [id number] DOB:Mar 07, 1941, 81 y.o., male, male Today's Date: 04/05/2022   PT End of Session - 04/05/22 0850     Visit Number 10    Number of Visits 12    Date for PT Re-Evaluation 04/10/22    Authorization Type Medicare + BCBS    PT Start Time 0848    PT Stop Time 0928    PT Time Calculation (min) 40 min    Activity Tolerance Patient tolerated treatment well    Behavior During Therapy [facility] for tasks assessed/performed                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[place])    Arthritis    Diverticulitis    GERD (gastroesophageal reflux disease)    Hyperlipemia    Hyperlipidemia    Osteoporosis    Palpitations    Past Surgical History:  Procedure Laterality Date   APPENDECTOMY  as child   CARDIAC CATHETERIZATION N/A 10/16/2016   Procedure: Left Heart Cath and Coronary Angiography;  Surgeon: Jettie Booze, MD;  Location: [place] [facility];  Service: Cardiovascular;  Laterality: N/A;   COLONOSCOPY WITH PROPOFOL N/A 02/11/2014   Procedure: COLONOSCOPY WITH PROPOFOL;  Surgeon: Cleotis Nipper, MD;  Location: [facility];  Service: Endoscopy;  Laterality: N/A;   KNEE ARTHROSCOPY Right 2013   torn meniscus repair   Patient Active Problem List   Diagnosis Date Noted   Mitral valve  regurgitation 01/25/2022   Atrial tachycardia ([place]) 09/15/2021   Pure hypercholesterolemia 09/15/2021   Primary osteoarthritis of right knee 04/28/2019   Acute lower UTI 09/04/2018   Prostatitis syndrome 09/04/2018   Hematuria 09/04/2018   BPH (benign prostatic hyperplasia) 09/04/2018   Sepsis secondary to UTI ([place]) 09/04/2018   UTI (urinary tract infection) 09/04/2018   Abnormal ECG during exercise stress test     PCP: Marrian Salvage, FNP  REFERRING PROVIDER: Mcarthur Rossetti, MD  REFERRING DIAG: M17.11 (ICD-10-CM) - Primary osteoarthritis of right knee M17.12 (ICD-10-CM) - Primary osteoarthritis of left knee  THERAPY DIAG:  Chronic pain of left knee  Cramp and spasm  Difficulty in walking, not elsewhere classified  Rationale for Evaluation and Treatment Rehabilitation  ONSET DATE: ~ 1 yr started difficulty going downstairs  SUBJECTIVE:   SUBJECTIVE STATEMENT: Pt. Reports knee is feeling pretty good.  Hasn't limped in days, didn't have any trouble playing golf yesterday.  Can put shoes and socks on now.    PERTINENT HISTORY: Bil knee OA, Angina, Osteoporosis, Arthritis  PAIN:  Are you having pain? Yes: NPRS scale: 1/10 Pain location: L knee Pain description: ache Aggravating factors:   Relieving factors:    PRECAUTIONS: None  WEIGHT BEARING RESTRICTIONS No  FALLS:  Has patient fallen in last 6 months? No  LIVING ENVIRONMENT: Lives with:  lives with their spouse Lives in: House/apartment Stairs: Yes: Internal: 12 steps; can reach both and External: 12 steps; can reach both Has following equipment at home: None  OCCUPATION: retired.  Plays golf, yard work  PLOF: Independent  PATIENT GOALS are there exercises that will help this knee relieve most of the pain so equal to R side   OBJECTIVE:   DIAGNOSTIC FINDINGS: XR on 02/09/2021 Left knee 2 views: Mild to moderate patellofemoral changes.  Medial joint  line with moderate to severe  narrowing.  Lateral compartment overall  well-maintained.  No acute fractures knee is well located. Right knee 2 views: No acute fractures.  Knee is well located.  Moderate  to moderately severe narrowing medial joint line.  Mild to moderate  patellofemoral changes.  PATIENT SURVEYS:  FOTO knee 59%, predicted outcome 68% after 11 visits  COGNITION:  Overall cognitive status: Within functional limits for tasks assessed     SENSATION: WFL  EDEMA:  Circumferential: 48.5 cm R, 49 cm L (at joint line) 39 cm R, 40 cm L (proximal)  MUSCLE LENGTH: Hamstrings: WNL - SLR to 80 deg bil  Quadriceps - tightness bil, prone knee bend to 90 deg, cramping in L hamstring  POSTURE: forward head  PALPATION: Tenderness patellar ligament inferior pole patella, increased swelling vastus lateralis  LOWER EXTREMITY ROM:  Active ROM Right eval Left eval Left 03/09/22  Knee flexion 125 108  117  Knee extension Lacking 2 deg  Lacking 6 deg 03/13/22- lacking 5 deg     LOWER EXTREMITY MMT:  MMT Right eval Left eval  Hip flexion 4+ 4+  Hip extension 4+ 4+  Hip internal rotation 5 5  Hip external rotation 5 5  Knee flexion 5 5  Knee extension 5 5     LOWER EXTREMITY SPECIAL TESTS:  Hip special tests: [name] (FABER) test: negative, Hip scouring test: negative  FUNCTIONAL TESTS:  5 times sit to stand: 9.2 seconds without UE assist  GAIT: Distance walked: 50 Assistive device utilized: None Level of assistance: Complete Independence Comments: antalgic, decreased stance time LLE   TODAY'S TREATMENT: 04/05/2022 Therapeutic Exercise: to improve strength and mobility.  Demo, verbal and tactile cues throughout for technique. Bike L3 x 6 min  Gait x 600' Stairs x 1 flight reciprocal step, no HR Review of HEP routine LTR stretch, seated hamstring stretch Manual Therapy: to decrease muscle spasm and pain and improve mobility IASTM with foam roller to L glut/piriformis and hamstrings.  Demo  self STM with foam roller in standing  03/26/2022 Therapeutic Exercise: to improve strength and mobility.  Demo, verbal and tactile cues throughout for technique. Bike L3 x 6 min  Cybex knee extension 35# 2 x 10 Cybex knee flexion 35# 2 x 10  Start excursion pattern - taps 360 deg x 3 each leg, SBA for safety. Lunges forward 2 x 10 bil, side 2 x 10 bil, and backwards 2 x 10 bil with foot on slider, cues for posture.  Seated hip flexion step overs x 20 bil Seated hamstring stretches post manual therapy.   Manual Therapy: to improve mobility STM/TPR to bil lateral hamstrings Trigger Point Dry-Needling  Treatment instructions: Expect mild to moderate muscle soreness. Patient verbalized understanding of these instructions and education.  Patient Consent Given: Yes Education handout provided: Previously provided Muscles treated: bil lateral hamstrings Treatment response/outcome: Twitch Response Elicited and Palpable Increase in Muscle Length   03/23/22 Therapeutic Exercise: to improve strength and mobility.  Bike L3 x  6 min  Cybex knee extension 25# x 15, 35# x 10 Cybex knee flexion 35# 2 x 10  Cybex leg press 25# 2 x 10 Cybex toe press 25# 2 x 10  Sit to stands GTB around thighs 3 x 10  Start excursion pattern - taps 180 deg front to back x 5 each leg, SBA for safety. Lunges forward x 10 bil, side x 10 bil, and backwards x 10 bil with foot on slider, cues for posture.  Manual Therapy: to decrease muscle spasm and pain and improve mobility.  STM/TPR L vastus, skilled palpation and monitoring during dry needling. Trigger Point Dry-Needling  Treatment instructions: Expect mild to moderate muscle soreness.Patient verbalized understanding of these instructions and education.  Patient Consent Given: Yes Education handout provided: Previously provided Muscles treated: L vastus lateralis Treatment response/outcome: Twitch Response Elicited and Palpable Increase in Muscle Length  03/20/22 Sit  to stand x 10, then with RTB around thighs x 10 Functional squats to mat + foam pad 2x 10 cues to WB equally            Heel taps 2 risers x 10 fwd and laterally   Sidestepping GTB and RTB 4 x 20 ft   Monster walk GTB fwd/bwd x 4   Standing hip ext GTB x 10 ea, ABD RTB x 10 ea (difficult standing on L)   SDLY ABD RTB x 10 bil   Step ups 2 risers x 10 bil   Seated fig 4 x 20 sec ea   PATIENT EDUCATION:  Education details: HEP update 03/23/22 Person educated: Patient Education method: Explanation and Handouts Education comprehension: verbalized understanding   HOME EXERCISE PROGRAM: Access Code: [email or web address]  ASSESSMENT:  CLINICAL IMPRESSION: Brandon Acosta has made very good progress towards goals, meeting or almost meeting all goals.  FOTO has improved to 67%.  He still lacks full L knee extension, but demonstrates improved heel strike with gait, and no longer has L knee pain.  He is independent with HEP.  Today reporting L buttock pain after "tweaking" back, reported decreased tightness afterwards.  He is agreeable to 30 day hold to continue progress on own.       OBJECTIVE IMPAIRMENTS Abnormal gait, difficulty walking, decreased ROM, decreased strength, increased edema, increased fascial restrictions, increased muscle spasms, and pain.   ACTIVITY LIMITATIONS bending, sitting, standing, and stairs  PARTICIPATION LIMITATIONS: community activity and yard work  PERSONAL FACTORS 1 comorbidity: bil knee OA  are also affecting patient's functional outcome.   REHAB POTENTIAL: Excellent  CLINICAL DECISION MAKING: Stable/uncomplicated  EVALUATION COMPLEXITY: Low   GOALS: Goals reviewed with patient? Yes  SHORT TERM GOALS: Target date: 03/13/2022   Patient will be independent with initial HEP. Baseline: given Goal status: MET   LONG TERM GOALS: Target date: 04/10/2022   Patient will be independent with advanced/ongoing HEP to improve outcomes and carryover.  Baseline: needs  progression Goal status: MET  2.  Patient will report at least 75% improvement in L knee pain to improve QOL. Baseline: constant ache Goal status: MET 713/23 75%  3.  Patient will demonstrate improved L knee AROM to >/= 2-115 deg to allow for normal gait and stair mechanics. Baseline: 6-108 Goal status: NOT MET 04/05/2022 6-120  4.  Patient will be able to ambulate 600' with LRAD and normal gait pattern without increased pain to access community.  Baseline: antalgic Goal status: MET 04/05/2022 600+ feet fast gait, even jogging short distance, without pain  5. Patient will  be able to ascend/descend stairs with 1 HR and reciprocal step pattern without knee pain safely to access home and community.  Baseline: increased pain in L knee with descent Goal status: MET 04/05/22 ascends/descends reciprocal step, no HR, no pain.   6.  Patient will report 68% on FOTO  to demonstrate improved functional ability. Baseline: 59% Goal status: IN PROGRESS 04/05/2022- 67%     PLAN: PT FREQUENCY: 1-2x/week  PT DURATION: 6 weeks  PLANNED INTERVENTIONS: Therapeutic exercises, Therapeutic activity, Neuromuscular re-education, Balance training, Gait training, Patient/Family education, Joint mobilization, Stair training, Dry Needling, Electrical stimulation, Cryotherapy, Moist heat, Taping, Ultrasound, Ionotophoresis 59m/ml Dexamethasone, and Manual therapy  PLAN FOR NEXT SESSION: continue quad/glute/HS strengthening, HS stretches.  Progress note/recert.   ERennie Natter PT , DPT  04/05/2022, 9:49 AM

## 2022-05-03 DIAGNOSIS — D485 Neoplasm of uncertain behavior of skin: Secondary | ICD-10-CM | POA: Diagnosis not present

## 2022-05-03 DIAGNOSIS — Z85828 Personal history of other malignant neoplasm of skin: Secondary | ICD-10-CM | POA: Diagnosis not present

## 2022-05-03 DIAGNOSIS — L570 Actinic keratosis: Secondary | ICD-10-CM | POA: Diagnosis not present

## 2022-05-03 DIAGNOSIS — D044 Carcinoma in situ of skin of scalp and neck: Secondary | ICD-10-CM | POA: Diagnosis not present

## 2022-05-14 ENCOUNTER — Telehealth: Payer: Self-pay | Admitting: Family

## 2022-05-14 NOTE — Telephone Encounter (Signed)
Patient called to get his physical so he can move into [facility] in November. They require a physical within 6 months of move in date so patient "is okay to pay the price of the visit if it's too early for medicare"

## 2022-05-29 ENCOUNTER — Ambulatory Visit (INDEPENDENT_AMBULATORY_CARE_PROVIDER_SITE_OTHER): Payer: Medicare Other | Admitting: Family

## 2022-05-29 VITALS — BP 133/76 | HR 63 | Resp 18 | Ht 68.0 in | Wt 144.0 lb

## 2022-05-29 DIAGNOSIS — E785 Hyperlipidemia, unspecified: Secondary | ICD-10-CM

## 2022-05-29 DIAGNOSIS — Z111 Encounter for screening for respiratory tuberculosis: Secondary | ICD-10-CM | POA: Diagnosis not present

## 2022-05-29 DIAGNOSIS — N400 Enlarged prostate without lower urinary tract symptoms: Secondary | ICD-10-CM | POA: Diagnosis not present

## 2022-05-29 LAB — COMPREHENSIVE METABOLIC PANEL
ALT: 22 U/L (ref 0–53)
AST: 22 U/L (ref 0–37)
Albumin: 3.8 g/dL (ref 3.5–5.2)
Alkaline Phosphatase: 74 U/L (ref 39–117)
BUN: 21 mg/dL (ref 6–23)
CO2: 27 mEq/L (ref 19–32)
Calcium: 9 mg/dL (ref 8.4–10.5)
Chloride: 103 mEq/L (ref 96–112)
Creatinine, Ser: 0.73 mg/dL (ref 0.40–1.50)
GFR: 85.99 mL/min (ref >60.00)
Glucose, Bld: 92 mg/dL (ref 70–99)
Potassium: 4.2 mEq/L (ref 3.5–5.1)
Sodium: 138 mEq/L (ref 135–145)
Total Bilirubin: 0.6 mg/dL (ref 0.2–1.2)
Total Protein: 6.4 g/dL (ref 6.0–8.3)

## 2022-05-29 LAB — CBC WITH DIFFERENTIAL/PLATELET
Basophils Absolute: 0 10*3/uL (ref 0.0–0.1)
Basophils Relative: 0.5 % (ref 0.0–3.0)
Eosinophils Absolute: 0.1 10*3/uL (ref 0.0–0.7)
Eosinophils Relative: 1.1 % (ref 0.0–5.0)
HCT: 41.7 % (ref 39.0–52.0)
Hemoglobin: 14.1 g/dL (ref 13.0–17.0)
Lymphocytes Relative: 29.5 % (ref 12.0–46.0)
Lymphs Abs: 1.5 10*3/uL (ref 0.7–4.0)
MCHC: 33.7 g/dL (ref 30.0–36.0)
MCV: 99.3 fl (ref 78.0–100.0)
Monocytes Absolute: 0.6 10*3/uL (ref 0.1–1.0)
Monocytes Relative: 12.9 % — ABNORMAL HIGH (ref 3.0–12.0)
Neutro Abs: 2.8 10*3/uL (ref 1.4–7.7)
Neutrophils Relative %: 56 % (ref 43.0–77.0)
Platelets: 219 10*3/uL (ref 150.0–400.0)
RBC: 4.2 Mil/uL — ABNORMAL LOW (ref 4.22–5.81)
RDW: 13.7 % (ref 11.5–15.5)
WBC: 5 10*3/uL (ref 4.0–10.5)

## 2022-05-29 LAB — URINALYSIS
Bilirubin Urine: NEGATIVE
Hgb urine dipstick: NEGATIVE
Ketones, ur: NEGATIVE
Leukocytes,Ua: NEGATIVE
Nitrite: NEGATIVE
Specific Gravity, Urine: 1.015 (ref 1.000–1.030)
Total Protein, Urine: NEGATIVE
Urine Glucose: NEGATIVE
Urobilinogen, UA: 0.2 (ref 0.0–1.0)
pH: 7 (ref 5.0–8.0)

## 2022-05-29 LAB — LIPID PANEL
Cholesterol: 163 mg/dL (ref 0–200)
HDL: 59.2 mg/dL (ref >39.00)
LDL Cholesterol: 94 mg/dL (ref 0–99)
NonHDL: 104.09
Total CHOL/HDL Ratio: 3
Triglycerides: 49 mg/dL (ref 0.0–149.0)
VLDL: 9.8 mg/dL (ref 0.0–40.0)

## 2022-05-29 NOTE — Progress Notes (Signed)
Brandon Acosta is a 81 y.o. male with the following history as recorded in EpicCare:  Patient Active Problem List   Diagnosis Date Noted   Mitral valve regurgitation 01/25/2022   Atrial tachycardia ([place]) 09/15/2021   Pure hypercholesterolemia 09/15/2021   Primary osteoarthritis of right knee 04/28/2019   Acute lower UTI 09/04/2018   Prostatitis syndrome 09/04/2018   Hematuria 09/04/2018   BPH (benign prostatic hyperplasia) 09/04/2018   Sepsis secondary to UTI ([place]) 09/04/2018   UTI (urinary tract infection) 09/04/2018   Abnormal ECG during exercise stress test     Current Outpatient Medications  Medication Sig Dispense Refill   ezetimibe (ZETIA) 10 MG tablet Take 1 tablet (10 mg total) by mouth daily. 90 tablet 3   ibuprofen (ADVIL,MOTRIN) 200 MG tablet Take 400 mg by mouth daily as needed for fever or moderate pain.     omeprazole (PRILOSEC) 20 MG capsule Take 20 mg by mouth daily.     oxymetazoline (AFRIN) 0.05 % nasal spray Place 1 spray into both nostrils 2 (two) times daily.     No current facility-administered medications for this visit.    Allergies: Simvastatin  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[place])    Arthritis    Diverticulitis    GERD (gastroesophageal reflux disease)    Hyperlipemia    Hyperlipidemia    Osteoporosis    Palpitations     Past Surgical History:  Procedure Laterality Date   APPENDECTOMY  as child   CARDIAC CATHETERIZATION N/A 10/16/2016   Procedure: Left Heart Cath and Coronary Angiography;  Surgeon: Jettie Booze, MD;  Location: [place] [facility];  Service: Cardiovascular;  Laterality: N/A;   COLONOSCOPY WITH PROPOFOL N/A 02/11/2014   Procedure: COLONOSCOPY WITH PROPOFOL;  Surgeon: Cleotis Nipper, MD;  Location: [facility];  Service: Endoscopy;  Laterality: N/A;   KNEE ARTHROSCOPY Right 2013   torn meniscus repair    Family History  Problem Relation Age of Onset   Alcoholism Mother    Breast cancer Mother     Heart disease Mother    Cancer - Lung Father    Breast cancer Sister    Cancer - Ovarian Sister    Breast cancer Sister    Tremor Sister     Social History   Tobacco Use   Smoking status: Former    Packs/day: 0.50    Years: 20.00    Total pack years: 10.00    Types: Cigarettes    Quit date: 09/24/1976    Years since quitting: 45.7   Smokeless tobacco: Never  Substance Use Topics   Alcohol use: Yes    Comment: occassionally 1 glass wine per day    Subjective:  Will be moving into new home at [facility] in November 2023; feeling more optimistic and comfortable with this planned move; needs updated paperwork and TB test updated for admission requirements;   Does need updated labs as part of this paperwork; would also like to get his cholesterol re-checked; only taking Zetia 2 x per week;   Has been feeling much better since having  procedure with his urologist in the past few months- sleeping better;   Objective:  Vitals:   05/29/22 1030  BP: 133/76  Pulse: 63  Resp: 18  SpO2: 100%  Weight: 144 lb (65.3 kg)  Height: _0  (1.727 m)    General: Well developed, well nourished, in no acute distress  Skin : Warm and dry.  Head: Normocephalic  and atraumatic  Lungs: Respirations unlabored;  Neurologic: Alert and oriented; speech intact; face symmetrical; moves all extremities well; CNII-XII intact without focal deficit   Assessment:  1. Screening for tuberculosis   2. Hyperlipidemia, unspecified hyperlipidemia type   3. Benign prostatic hyperplasia, unspecified whether lower urinary tract symptoms present     Plan:  Will update paperwork as requested and required; will update labs today as needed; follow up in 6 months- 1 year;  No follow-ups on file.  Orders Placed This Encounter  Procedures   QuantiFERON-TB Gold Plus ([facility] Only)   CBC with Differential/Platelet   Comp Met ([facility])   Urinalysis   Lipid panel    Requested Prescriptions    No prescriptions  requested or ordered in this encounter

## 2022-05-31 LAB — QUANTIFERON-TB GOLD PLUS
QuantiFERON Mitogen Value: >10 IU/mL
QuantiFERON Nil Value: 0.03 IU/mL
QuantiFERON TB1 Ag Value: 0.03 IU/mL
QuantiFERON TB2 Ag Value: 0.02 IU/mL
QuantiFERON-TB Gold Plus: NEGATIVE

## 2022-06-03 DIAGNOSIS — Z23 Encounter for immunization: Secondary | ICD-10-CM | POA: Diagnosis not present

## 2022-06-06 ENCOUNTER — Telehealth: Payer: Self-pay

## 2022-06-06 NOTE — Telephone Encounter (Signed)
I have called the pt and informed him that we have faxed the form for [facility] and we are waiting on his wife to have all of the Labs and UA and done before we can send the forms to [facility] together.

## 2022-06-17 DIAGNOSIS — Z23 Encounter for immunization: Secondary | ICD-10-CM | POA: Diagnosis not present

## 2022-06-26 ENCOUNTER — Ambulatory Visit: Payer: Medicare Other | Admitting: Family

## 2022-08-24 ENCOUNTER — Ambulatory Visit (HOSPITAL_BASED_OUTPATIENT_CLINIC_OR_DEPARTMENT_OTHER)
Admission: RE | Admit: 2022-08-24 | Discharge: 2022-08-24 | Disposition: A | Discharge status: Home / Self Care | Payer: Medicare Other | Source: Ambulatory Visit | Attending: Pulmonary Disease | Admitting: Pulmonary Disease

## 2022-08-24 DIAGNOSIS — R9389 Abnormal findings on diagnostic imaging of other specified body structures: Secondary | ICD-10-CM | POA: Diagnosis not present

## 2022-08-24 DIAGNOSIS — I700 Atherosclerosis of aorta: Secondary | ICD-10-CM | POA: Diagnosis not present

## 2022-08-24 DIAGNOSIS — R0789 Other chest pain: Secondary | ICD-10-CM | POA: Diagnosis not present

## 2022-08-31 ENCOUNTER — Ambulatory Visit (INDEPENDENT_AMBULATORY_CARE_PROVIDER_SITE_OTHER): Payer: Medicare Other | Admitting: Pulmonary Disease

## 2022-08-31 ENCOUNTER — Encounter: Payer: Self-pay | Admitting: Pulmonary Disease

## 2022-08-31 VITALS — BP 110/60 | HR 65 | Temp 97.6°F | Ht 67.0 in | Wt 142.0 lb

## 2022-08-31 DIAGNOSIS — R9389 Abnormal findings on diagnostic imaging of other specified body structures: Secondary | ICD-10-CM

## 2022-08-31 NOTE — Progress Notes (Signed)
Synopsis: Referred in December 2022 for Abnormal CT Lung by Marvis Repress, FNP  Subjective:   PATIENT ID: Brandon Acosta GENDER: male DOB: Jan 11, 1941, MRN: [id number]  HPI  Chief Complaint  Patient presents with   Follow-up    Doing well.  Review CT scan   Brandon Acosta is a 81 year old male, former smoker with GERD and BPH who returns to pulmonary clinic for abnormal CT Chest scan.  They recently moved into independent living facility and down sized. He has been feeling well. He reports a 5lbs weight loss due to stress of the move and has gained the weight back. He has been drinking liquids through a straw and has not had much issues with cough or aspiration.   Repeat CT Chest 08/24/22 shows stable tree in bud infiltrates of the anterior RUL.   Video visit 10/16/21 Patient was previously seen on 09/01/2021 for mild areas of scattered micro nodularity of the right lung possibly related to an indolent mycobacterial infection or other atypical infection.  At that time he was asymptomatic and the plan was to follow-up in 1 year with a repeat CT chest scan.   He went to the ER on 09/14/2021 for symptoms of chest pain in which a CTA chest was performed which showed unchanged clusters of small nodules in the right middle lobe.  He then had CT cardiac scan on 10/12/2021 mentioning of changes in size of the nodularity of the right lung.   I reviewed the imaging with the patient and his wife via shared screen.  The patient does report concerns for dysphagia and coughing with meals.  Initial OV 09/01/21 Patient reported having hematuria last month in which a CT abdomen pelvis was performed noting renal stones.  The CT abdomen was also notable for areas of clustered micro nodularity of the right middle lobe in which a CT chest scan was ordered by his primary care team for further evaluation of these abnormalities.  The CT chest scan again showed mild clustered micro nodularity in the right middle  lobe and inferior right upper lobe similar to recent abdominal CT imaging.  There is also a small granuloma in the right upper lobe.  Patient reports hematuria has resolved and there were no abnormal findings on cystoscopy except for nephrolithiasis.  Patient denies any respiratory symptoms of cough, wheezing, shortness of breath, sputum production or chest discomfort.  He denies any fevers, chills, night sweats loss of appetite or weight loss.  Patient is a former smoker and quit in 1978.  He is currently retired denies any harmful dust or chemical exposures throughout his occupational history.  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HCC)    Arthritis    Diverticulitis    GERD (gastroesophageal reflux disease)    Hyperlipemia    Hyperlipidemia    Osteoporosis    Palpitations      Family History  Problem Relation Age of Onset   Alcoholism Mother    Breast cancer Mother    Heart disease Mother    Cancer - Lung Father    Breast cancer Sister    Cancer - Ovarian Sister    Breast cancer Sister    Tremor Sister      Social History   Socioeconomic History   Marital status: Married    Spouse name: Not on file   Number of children: Not on file   Years of education: Not on file   Highest education level: Not  on file  Occupational History   Not on file  Tobacco Use   Smoking status: Former    Packs/day: 0.50    Years: 20.00    Total pack years: 10.00    Types: Cigarettes    Quit date: 09/24/1976    Years since quitting: 45.9   Smokeless tobacco: Never  Vaping Use   Vaping Use: Never used  Substance and Sexual Activity   Alcohol use: Yes    Comment: occassionally 1 glass wine per day   Drug use: No   Sexual activity: Not on file  Other Topics Concern   Not on file  Social History Narrative   Lives with wife   Caffeine use: Tea daily   Right handed    Social Determinants of Health   Financial Resource Strain: Low Risk  (01/15/2022)   Overall  Financial Resource Strain (CARDIA)    Difficulty of Paying Living Expenses: Not hard at all  Food Insecurity: No Food Insecurity (01/15/2022)   Hunger Vital Sign    Worried About Running Out of Food in the Last Year: Never true    Ran Out of Food in the Last Year: Never true  Transportation Needs: No Transportation Needs (01/15/2022)   PRAPARE - Hydrologist (Medical): No    Lack of Transportation (Non-Medical): No  Physical Activity: Sufficiently Active (01/15/2022)   Exercise Vital Sign    Days of Exercise per Week: 7 days    Minutes of Exercise per Session: 30 min  Stress: No Stress Concern Present (01/15/2022)   [place]    Feeling of Stress : Not at all  Social Connections: [place] (01/15/2022)   Social Connection and Isolation Panel [NHANES]    Frequency of Communication with Friends and Family: More than three times a week    Frequency of Social Gatherings with Friends and Family: More than three times a week    Attends Religious Services: More than 4 times per year    Active Member of [employer] or Organizations: Yes    Attends Music therapist: More than 4 times per year    Marital Status: Married  Human resources officer Violence: Not At Risk (01/15/2022)   Humiliation, Afraid, Rape, and Kick questionnaire    Fear of Current or Ex-Partner: No    Emotionally Abused: No    Physically Abused: No    Sexually Abused: No     Allergies  Allergen Reactions   Simvastatin Other (See Comments)     Outpatient Medications Prior to Visit  Medication Sig Dispense Refill   ezetimibe (ZETIA) 10 MG tablet Take 1 tablet (10 mg total) by mouth daily. 90 tablet 3   ibuprofen (ADVIL,MOTRIN) 200 MG tablet Take 400 mg by mouth daily as needed for fever or moderate pain.     omeprazole (PRILOSEC) 20 MG capsule Take 20 mg by mouth daily.     oxymetazoline (AFRIN) 0.05 % nasal spray Place  1 spray into both nostrils 2 (two) times daily.     No facility-administered medications prior to visit.    Review of Systems  Constitutional:  Negative for chills, fever, malaise/fatigue and weight loss.  HENT:  Negative for congestion, sinus pain and sore throat.   Eyes: Negative.   Respiratory:  Negative for cough, hemoptysis, sputum production, shortness of breath and wheezing.   Cardiovascular:  Negative for chest pain, palpitations, orthopnea, claudication and leg swelling.  Gastrointestinal:  Negative  for abdominal pain, heartburn, nausea and vomiting.  Genitourinary: Negative.   Musculoskeletal:  Negative for joint pain and myalgias.  Skin:  Negative for rash.  Neurological:  Negative for weakness.  Endo/Heme/Allergies: Negative.   Psychiatric/Behavioral: Negative.        Objective:   Vitals:   08/31/22 0856  BP: 110/60  Pulse: 65  Temp: 97.6 F (36.4 C)  TempSrc: Oral  SpO2: 98%  Weight: 142 lb (64.4 kg)  Height: '5\' 7"'$  (1.702 m)     Physical Exam Constitutional:      General: He is not in acute distress. HENT:     Head: Normocephalic and atraumatic.  Eyes:     Conjunctiva/sclera: Conjunctivae normal.  Cardiovascular:     Rate and Rhythm: Normal rate and regular rhythm.     Pulses: Normal pulses.     Heart sounds: Normal heart sounds. No murmur heard. Pulmonary:     Effort: Pulmonary effort is normal.     Breath sounds: Normal breath sounds.  Musculoskeletal:     Right lower leg: No edema.     Left lower leg: No edema.  Skin:    General: Skin is warm and dry.  Neurological:     General: No focal deficit present.     Mental Status: He is alert.  Psychiatric:        Mood and Affect: Mood normal.        Behavior: Behavior normal.        Thought Content: Thought content normal.        Judgment: Judgment normal.    CBC    Component Value Date/Time   WBC 5.0 05/29/2022 1053   RBC 4.20 (L) 05/29/2022 1053   HGB 14.1 05/29/2022 1053   HCT 41.7  05/29/2022 1053   PLT 219.0 05/29/2022 1053   MCV 99.3 05/29/2022 1053   MCH 33.6 09/14/2021 1244   MCHC 33.7 05/29/2022 1053   RDW 13.7 05/29/2022 1053   LYMPHSABS 1.5 05/29/2022 1053   MONOABS 0.6 05/29/2022 1053   EOSABS 0.1 05/29/2022 1053   BASOSABS 0.0 05/29/2022 1053      Latest Ref Rng & Units 05/29/2022   10:53 AM 09/14/2021   12:44 PM 09/07/2018    7:58 AM  BMP  Glucose 70 - 99 mg/dL 92  125  100   BUN 6 - 23 mg/dL '21  18  12   '$ Creatinine 0.40 - 1.50 mg/dL 0.73  0.75  0.79   Sodium 135 - 145 mEq/L 138  136  136   Potassium 3.5 - 5.1 mEq/L 4.2  3.8  3.4   Chloride 96 - 112 mEq/L 103  102  107   CO2 19 - 32 mEq/L '27  28  22   '$ Calcium 8.4 - 10.5 mg/dL 9.0  8.9  7.8    Chest imaging: CT Chest 08/24/22 Cardiovascular: No significant vascular findings. Normal heart size. No pericardial effusion. There are atherosclerotic calcifications of the aorta.   Mediastinum/Nodes: No enlarged mediastinal or axillary lymph nodes. Thyroid gland, trachea, and esophagus demonstrate no significant findings.   Lungs/Pleura: There is minimal tree-in-bud opacities in the right middle lobe and right upper lobe. There is no pleural effusion or pneumothorax. The lungs are otherwise clear.  CT Chest Scan 08/25/21 No acute upper abdominal findings.   Mild clustered micro nodularity in the RIGHT middle lobe and inferior RIGHT upper lobe, similar to recent abdominal CT imaging. Findings are nonspecific but can be seen in the setting of  atypical infection such as MAI.   Cholelithiasis.   Nephrolithiasis with a 3-4 mm calculus in the upper pole the LEFT kidney.   Aortic atherosclerosis.   Aortic Atherosclerosis (ICD10-I70.0).   PFT:     No data to display          Labs:  Path:  Echo:  Heart Catheterization:  Modified Barium Swallow 11/28/21 - Mild pharyngeal dysphagia c/b decreased laryngeal closure and impaired elevation resulting in consistent laryngeal penetration of  thin liquids. Chin tuck posture decreased penetrates significantly but still allowed minimal amount.  - Recommend pt continue diet as tolerated, implementing chin tuck posture with liquids.    Assessment & Plan:   Abnormal CT of the chest - Plan: CT Chest Wo Contrast  Discussion: Brandon Acosta is a 81 year old male, former smoker with GERD and BPH who returns to pulmonary clinic for abnormal CT Chest scan.  Patient has very mild areas of scattered micro nodularity of the right lung which may represent indolent mycobacterial infection or other atypical infection which remain stable on follow up CT Chest scan.  Patient is asymptomatic at this time.   He has dysphagia with liquids which has improved after evaluation with speech therapy.  We will repeat a CT Chest scan in 1 year and if remains stable he does no warrant further radiographic follow up.  Follow up in 1 year.  Freda Jackson, MD [facility] Office: [phone]   Current Outpatient Medications:    ezetimibe (ZETIA) 10 MG tablet, Take 1 tablet (10 mg total) by mouth daily., Disp: 90 tablet, Rfl: 3   ibuprofen (ADVIL,MOTRIN) 200 MG tablet, Take 400 mg by mouth daily as needed for fever or moderate pain., Disp: , Rfl:    omeprazole (PRILOSEC) 20 MG capsule, Take 20 mg by mouth daily., Disp: , Rfl:    oxymetazoline (AFRIN) 0.05 % nasal spray, Place 1 spray into both nostrils 2 (two) times daily., Disp: , Rfl:

## 2022-08-31 NOTE — Patient Instructions (Signed)
Your recent CT Chest scan is stable compared to last year.  Call sooner if you develop cough, sputum production, fatigue, fevers, chills, or sweats.  Follow up in 1 year with CT Chest scan

## 2022-09-24 HISTORY — PX: HERNIA REPAIR: SHX51

## 2022-10-05 DIAGNOSIS — G43909 Migraine, unspecified, not intractable, without status migrainosus: Secondary | ICD-10-CM | POA: Diagnosis not present

## 2022-10-05 DIAGNOSIS — H5340 Unspecified visual field defects: Secondary | ICD-10-CM | POA: Diagnosis not present

## 2022-10-09 DIAGNOSIS — L570 Actinic keratosis: Secondary | ICD-10-CM | POA: Diagnosis not present

## 2022-10-09 DIAGNOSIS — D1801 Hemangioma of skin and subcutaneous tissue: Secondary | ICD-10-CM | POA: Diagnosis not present

## 2022-10-09 DIAGNOSIS — L821 Other seborrheic keratosis: Secondary | ICD-10-CM | POA: Diagnosis not present

## 2022-10-09 DIAGNOSIS — D2371 Other benign neoplasm of skin of right lower limb, including hip: Secondary | ICD-10-CM | POA: Diagnosis not present

## 2022-10-09 DIAGNOSIS — L905 Scar conditions and fibrosis of skin: Secondary | ICD-10-CM | POA: Diagnosis not present

## 2022-10-09 DIAGNOSIS — D225 Melanocytic nevi of trunk: Secondary | ICD-10-CM | POA: Diagnosis not present

## 2022-10-09 DIAGNOSIS — L814 Other melanin hyperpigmentation: Secondary | ICD-10-CM | POA: Diagnosis not present

## 2022-10-09 DIAGNOSIS — Z85828 Personal history of other malignant neoplasm of skin: Secondary | ICD-10-CM | POA: Diagnosis not present

## 2022-10-26 ENCOUNTER — Ambulatory Visit (INDEPENDENT_AMBULATORY_CARE_PROVIDER_SITE_OTHER): Payer: Medicare Other | Admitting: Family

## 2022-10-26 VITALS — BP 112/62 | HR 70 | Temp 97.6°F | Resp 18 | Ht 67.0 in | Wt 144.6 lb

## 2022-10-26 DIAGNOSIS — E7800 Pure hypercholesterolemia, unspecified: Secondary | ICD-10-CM | POA: Diagnosis not present

## 2022-10-26 DIAGNOSIS — R1031 Right lower quadrant pain: Secondary | ICD-10-CM | POA: Diagnosis not present

## 2022-10-26 LAB — COMPREHENSIVE METABOLIC PANEL
ALT: 16 U/L (ref 0–53)
AST: 15 U/L (ref 0–37)
Albumin: 3.7 g/dL (ref 3.5–5.2)
Alkaline Phosphatase: 93 U/L (ref 39–117)
BUN: 18 mg/dL (ref 6–23)
CO2: 29 mEq/L (ref 19–32)
Calcium: 8.6 mg/dL (ref 8.4–10.5)
Chloride: 106 mEq/L (ref 96–112)
Creatinine, Ser: 0.69 mg/dL (ref 0.40–1.50)
GFR: 87.21 mL/min (ref >60.00)
Glucose, Bld: 100 mg/dL — ABNORMAL HIGH (ref 70–99)
Potassium: 4.2 mEq/L (ref 3.5–5.1)
Sodium: 140 mEq/L (ref 135–145)
Total Bilirubin: 0.4 mg/dL (ref 0.2–1.2)
Total Protein: 6 g/dL (ref 6.0–8.3)

## 2022-10-26 LAB — LIPID PANEL
Cholesterol: 167 mg/dL (ref 0–200)
HDL: 54 mg/dL (ref >39.00)
LDL Cholesterol: 100 mg/dL — ABNORMAL HIGH (ref 0–99)
NonHDL: 112.52
Total CHOL/HDL Ratio: 3
Triglycerides: 64 mg/dL (ref 0.0–149.0)
VLDL: 12.8 mg/dL (ref 0.0–40.0)

## 2022-10-26 LAB — CBC WITH DIFFERENTIAL/PLATELET
Basophils Absolute: 0 10*3/uL (ref 0.0–0.1)
Basophils Relative: 0.5 % (ref 0.0–3.0)
Eosinophils Absolute: 0.1 10*3/uL (ref 0.0–0.7)
Eosinophils Relative: 1.2 % (ref 0.0–5.0)
HCT: 41.9 % (ref 39.0–52.0)
Hemoglobin: 14.2 g/dL (ref 13.0–17.0)
Lymphocytes Relative: 24.4 % (ref 12.0–46.0)
Lymphs Abs: 1.2 10*3/uL (ref 0.7–4.0)
MCHC: 33.9 g/dL (ref 30.0–36.0)
MCV: 99.9 fl (ref 78.0–100.0)
Monocytes Absolute: 0.6 10*3/uL (ref 0.1–1.0)
Monocytes Relative: 12.2 % — ABNORMAL HIGH (ref 3.0–12.0)
Neutro Abs: 2.9 10*3/uL (ref 1.4–7.7)
Neutrophils Relative %: 61.7 % (ref 43.0–77.0)
Platelets: 238 10*3/uL (ref 150.0–400.0)
RBC: 4.19 Mil/uL — ABNORMAL LOW (ref 4.22–5.81)
RDW: 13.4 % (ref 11.5–15.5)
WBC: 4.8 10*3/uL (ref 4.0–10.5)

## 2022-10-26 NOTE — Progress Notes (Signed)
Brandon Acosta is a 82 y.o. 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 90 tablet 3   ibuprofen (ADVIL,MOTRIN) 200 MG tablet Take 400 mg by mouth daily as needed for fever or moderate pain.     omeprazole (PRILOSEC) 20 MG capsule Take 20 mg by mouth daily.     oxymetazoline (AFRIN) 0.05 % nasal spray Place 1 spray into both nostrils 2 (two) times daily.     No current facility-administered medications for this visit.    Allergies: Simvastatin  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[place])    Arthritis    Diverticulitis    GERD (gastroesophageal reflux disease)    Hyperlipemia    Hyperlipidemia    Osteoporosis    Palpitations     Past Surgical History:  Procedure Laterality Date   APPENDECTOMY  as child   CARDIAC CATHETERIZATION N/A 10/16/2016   Procedure: Left Heart Cath and Coronary Angiography;  Surgeon: Jettie Booze, MD;  Location: [facility];  Service: Cardiovascular;  Laterality: N/A;   COLONOSCOPY WITH PROPOFOL N/A 02/11/2014   Procedure: COLONOSCOPY WITH PROPOFOL;  Surgeon: Cleotis Nipper, MD;  Location: [facility];  Service: Endoscopy;  Laterality: N/A;   KNEE ARTHROSCOPY Right 2013   torn meniscus repair    Family History  Problem Relation Age of Onset   Alcoholism Mother    Breast cancer Mother     Heart disease Mother    Cancer - Lung Father    Breast cancer Sister    Cancer - Ovarian Sister    Breast cancer Sister    Tremor Sister     Social History   Tobacco Use   Smoking status: Former    Packs/day: 0.50    Years: 20.00    Total pack years: 10.00    Types: Cigarettes    Quit date: 09/24/1976    Years since quitting: 46.1   Smokeless tobacco: Never  Substance Use Topics   Alcohol use: Yes    Comment: occassionally 1 glass wine per day    Subjective:   Concerns for hernia; symptoms present x 10 days; feels symptoms are related to pickle ball- has been playing for about 3 weeks; right inguinal pain/ swelling; symptoms more problematic in the evening; no changes in bowel movements;   Objective:  Vitals:   10/26/22 0822  BP: 112/62  Pulse: 70  Resp: 18  Temp: 97.6 F (36.4 C)  TempSrc: Oral  SpO2: 98%  Weight: 144 lb 9.6 oz (65.6 kg)  Height: '5\' 7"'$  (1.702 m)    General: Well developed, well nourished, in no acute distress  Skin : Warm and dry.  Head: Normocephalic and atraumatic  Lungs: Respirations unlabored;  Musculoskeletal: No deformities; no active joint inflammation; mild swelling noted over right inguinal region Extremities: No edema, cyanosis, clubbing  Vessels: Symmetric bilaterally  Neurologic: Alert and oriented; speech intact; face symmetrical; moves all extremities well; CNII-XII intact without focal deficit   Assessment:  1. Right inguinal pain   2. Right lower quadrant abdominal pain   3. Pure hypercholesterolemia     Plan:  Will update abd/ pevlic CT; will most likely need to refer to general surgeon for further evaluation; he understands to avoid heavy lifting; check CBC, CMP; Check lipid panel per patient request;    No follow-ups on file.  Orders Placed This Encounter  Procedures   CT Abdomen Pelvis W Contrast    Standing Status:   Future    Standing Expiration Date:   10/27/2023    Order Specific Question:   If indicated for the  ordered procedure, I authorize the administration of contrast media per Radiology protocol    Answer:   Yes    Order Specific Question:   Does the patient have a contrast media/X-ray dye allergy?    Answer:   No    Order Specific Question:   Preferred imaging location?    Answer:   [facility] Specific Question:   Is Oral Contrast requested for this exam?    Answer:   Yes, Per Radiology protocol   CBC with Differential/Platelet   Comp Met (CMET)   Lipid panel    Requested Prescriptions    No prescriptions requested or ordered in this encounter

## 2022-10-29 ENCOUNTER — Ambulatory Visit (HOSPITAL_BASED_OUTPATIENT_CLINIC_OR_DEPARTMENT_OTHER)
Admission: RE | Admit: 2022-10-29 | Discharge: 2022-10-29 | Disposition: A | Discharge status: Home / Self Care | Payer: Medicare Other | Source: Ambulatory Visit | Attending: Family | Admitting: Family

## 2022-10-29 DIAGNOSIS — R1031 Right lower quadrant pain: Secondary | ICD-10-CM | POA: Diagnosis not present

## 2022-10-29 DIAGNOSIS — R109 Unspecified abdominal pain: Secondary | ICD-10-CM | POA: Diagnosis not present

## 2022-10-29 DIAGNOSIS — N200 Calculus of kidney: Secondary | ICD-10-CM | POA: Diagnosis not present

## 2022-10-29 DIAGNOSIS — I700 Atherosclerosis of aorta: Secondary | ICD-10-CM | POA: Diagnosis not present

## 2022-10-29 MED ORDER — IOHEXOL 300 MG/ML  SOLN
100.0000 mL | Freq: Once | INTRAMUSCULAR | Status: AC | PRN
  Administered 2022-10-29: 100 mL via INTRAVENOUS

## 2022-10-30 ENCOUNTER — Other Ambulatory Visit: Payer: Self-pay | Admitting: Family

## 2022-10-30 ENCOUNTER — Encounter: Payer: Self-pay | Admitting: Family

## 2022-10-30 MED ORDER — MELOXICAM 15 MG PO TABS: 15.0000 mg | ORAL_TABLET | Freq: Every day | ORAL | 0 refills | Status: DC

## 2022-11-20 ENCOUNTER — Ambulatory Visit (INDEPENDENT_AMBULATORY_CARE_PROVIDER_SITE_OTHER): Payer: Medicare Other | Admitting: Family

## 2022-11-20 ENCOUNTER — Encounter: Payer: Self-pay | Admitting: Family

## 2022-11-20 VITALS — BP 112/68 | HR 59 | Resp 18 | Ht 67.0 in | Wt 143.4 lb

## 2022-11-20 DIAGNOSIS — R1031 Right lower quadrant pain: Secondary | ICD-10-CM

## 2022-11-20 NOTE — Progress Notes (Signed)
Brandon Acosta is a 82 y.o. 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 90 tablet 3   ibuprofen (ADVIL,MOTRIN) 200 MG tablet Take 400 mg by mouth daily as needed for fever or moderate pain.     meloxicam (MOBIC) 15 MG tablet Take 1 tablet (15 mg total) by mouth daily. 30 tablet 0   omeprazole (PRILOSEC) 20 MG capsule Take 20 mg by mouth daily.     oxymetazoline (AFRIN) 0.05 % nasal spray Place 1 spray into both nostrils 2 (two) times daily.     No current facility-administered medications for this visit.    Allergies: Simvastatin  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[place])    Arthritis    Diverticulitis    GERD (gastroesophageal reflux disease)    Hyperlipemia    Hyperlipidemia    Osteoporosis    Palpitations     Past Surgical History:  Procedure Laterality Date   APPENDECTOMY  as child   CARDIAC CATHETERIZATION N/A 10/16/2016   Procedure: Left Heart Cath and Coronary Angiography;  Surgeon: Jettie Booze, MD;  Location: [facility] [facility];  Service: Cardiovascular;  Laterality: N/A;   COLONOSCOPY WITH PROPOFOL N/A 02/11/2014   Procedure: COLONOSCOPY WITH PROPOFOL;  Surgeon: Cleotis Nipper, MD;  Location: [facility];  Service: Endoscopy;  Laterality: N/A;   KNEE ARTHROSCOPY Right 2013   torn meniscus repair    Family  History  Problem Relation Age of Onset   Alcoholism Mother    Breast cancer Mother    Heart disease Mother    Cancer - Lung Father    Breast cancer Sister    Cancer - Ovarian Sister    Breast cancer Sister    Tremor Sister     Social History   Tobacco Use   Smoking status: Former    Packs/day: 0.50    Years: 20.00    Total pack years: 10.00    Types: Cigarettes    Quit date: 09/24/1976    Years since quitting: 46.1   Smokeless tobacco: Never  Substance Use Topics   Alcohol use: Yes    Comment: occassionally 1 glass wine per day    Subjective:   Patient is concerned that he does have right inguinal hernia; symptoms x 1 month; had CT done in early February that did not show a hernia; patient is requesting referral to surgeon for 2nd opinion and to discuss treatment options;  States that when he wakes up in the am- his groin area is "flat"- as day progresses, has more noticeable "outpouching." Has been able to get relief with belt; describes as "pinching" sensation;   Objective:  Vitals:   11/20/22 1536  BP: 112/68  Pulse: (!) 59  Resp: 18  SpO2: 98%  Weight: 143 lb 6.4 oz (65 kg)  Height: '5\' 7"'$  (1.702 m)  General: Well developed, well nourished, in no acute distress  Skin : Warm and dry.  Head: Normocephalic and atraumatic  Lungs: Respirations unlabored;  Neurologic: Alert and oriented; speech intact; face symmetrical; moves all extremities well; CNII-XII intact without focal deficit   Assessment:  1. Right inguinal pain     Plan:  ? If CT has missed hernia presentation; due to persisting symptoms and per patient request, will go ahead and refer to general surgery for further evaluation/ discussion.   No follow-ups on file.  Orders Placed This Encounter  Procedures   Ambulatory referral to General Surgery    Referral Priority:   Routine    Referral Type:   Surgical    Referral Reason:   Specialty Services Required    Requested Specialty:   General Surgery     Number of Visits Requested:   1    Requested Prescriptions    No prescriptions requested or ordered in this encounter

## 2022-11-27 ENCOUNTER — Other Ambulatory Visit: Payer: Self-pay | Admitting: Family

## 2022-11-29 ENCOUNTER — Encounter: Payer: Self-pay | Admitting: Radiology

## 2022-11-29 DIAGNOSIS — K4090 Unilateral inguinal hernia, without obstruction or gangrene, not specified as recurrent: Secondary | ICD-10-CM | POA: Diagnosis not present

## 2022-12-03 DIAGNOSIS — K4090 Unilateral inguinal hernia, without obstruction or gangrene, not specified as recurrent: Secondary | ICD-10-CM | POA: Diagnosis not present

## 2022-12-03 DIAGNOSIS — G8918 Other acute postprocedural pain: Secondary | ICD-10-CM | POA: Diagnosis not present

## 2022-12-11 ENCOUNTER — Telehealth: Payer: Self-pay | Admitting: Orthopaedic Surgery

## 2022-12-11 NOTE — Telephone Encounter (Signed)
Patient wants bilateral knee gel injections.

## 2022-12-31 ENCOUNTER — Encounter: Payer: Self-pay | Admitting: Orthopaedic Surgery

## 2022-12-31 ENCOUNTER — Ambulatory Visit (INDEPENDENT_AMBULATORY_CARE_PROVIDER_SITE_OTHER): Payer: Medicare Other | Admitting: Orthopaedic Surgery

## 2022-12-31 DIAGNOSIS — G8929 Other chronic pain: Secondary | ICD-10-CM | POA: Diagnosis not present

## 2022-12-31 DIAGNOSIS — M25562 Pain in left knee: Secondary | ICD-10-CM

## 2022-12-31 DIAGNOSIS — M25561 Pain in right knee: Secondary | ICD-10-CM | POA: Diagnosis not present

## 2022-12-31 DIAGNOSIS — M1712 Unilateral primary osteoarthritis, left knee: Secondary | ICD-10-CM | POA: Diagnosis not present

## 2022-12-31 DIAGNOSIS — M1711 Unilateral primary osteoarthritis, right knee: Secondary | ICD-10-CM

## 2022-12-31 NOTE — Progress Notes (Signed)
The patient is a 82 year old gentleman well-known to me.  He has known well-documented arthritis of both knees.  What is helped him in the past is hyaluronic acid.  He has tried and failed other treatment measures including steroid injections.  He would like to try hyaluronic acid again for both his knees.  He does play pickle ball.  He is recently had abdominal muscle tear that had surgery and he is having to stay out of sports especially golf right now.  He does stay at [facility] with his wife.  Both knees have varus malalignment that is correctable.  Fortunately neither knee today has an effusion.  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  We will work on getting him set up for viscosupplementation with hyaluronic acid for both knees.  We will call when this is been approved.  This is the next obvious step for him given the arthritis in his knees and given the fact that this is work significantly in the past.  This patient is diagnosed with osteoarthritis of the knee(s).    Radiographs show evidence of joint space narrowing, osteophytes, subchondral sclerosis and/or subchondral cysts.  This patient has knee pain which interferes with functional and activities of daily living.    This patient has experienced inadequate response, adverse effects and/or intolerance with conservative treatments such as acetaminophen, NSAIDS, topical creams, physical therapy or regular exercise, knee bracing and/or weight loss.   This patient has experienced inadequate response or has a contraindication to intra articular steroid injections for at least 3 months.   This patient is not scheduled to have a total knee replacement within 6 months of starting treatment with viscosupplementation.

## 2023-01-01 ENCOUNTER — Telehealth: Payer: Self-pay

## 2023-01-01 NOTE — Telephone Encounter (Signed)
Bilateral knee gel injections 

## 2023-01-02 NOTE — Telephone Encounter (Signed)
VOB submitted for Orthovisc, bilateral knee 

## 2023-01-04 ENCOUNTER — Telehealth: Payer: Self-pay | Admitting: Family

## 2023-01-04 NOTE — Telephone Encounter (Signed)
Copied from CRM [id number]. Topic: Medicare AWV >> Jan 04, 2023  2:59 PM Payton Doughty wrote: Reason for CRM: Called patient to schedule Medicare Annual Wellness Visit (AWV). Left message for patient to call back and schedule Medicare Annual Wellness Visit (AWV).  Last date of AWV: 01/15/22  Please schedule an appointment at any time with Donne Anon, CMA    If any questions, please contact me.  Thank you ,  Verlee Rossetti; Care Guide Ambulatory Clinical Support [place] [facility] Direct Dial: [phone]

## 2023-01-28 ENCOUNTER — Encounter: Payer: Self-pay | Admitting: Family Medicine

## 2023-01-28 ENCOUNTER — Ambulatory Visit (INDEPENDENT_AMBULATORY_CARE_PROVIDER_SITE_OTHER): Payer: Medicare Other | Admitting: Family Medicine

## 2023-01-28 VITALS — BP 100/68 | HR 68 | Temp 98.1°F | Resp 18 | Ht 67.0 in | Wt 144.2 lb

## 2023-01-28 DIAGNOSIS — R051 Acute cough: Secondary | ICD-10-CM

## 2023-01-28 DIAGNOSIS — J0140 Acute pansinusitis, unspecified: Secondary | ICD-10-CM | POA: Insufficient documentation

## 2023-01-28 DIAGNOSIS — J029 Acute pharyngitis, unspecified: Secondary | ICD-10-CM | POA: Diagnosis not present

## 2023-01-28 LAB — POCT RAPID STREP A (OFFICE): Rapid Strep A Screen: NEGATIVE

## 2023-01-28 LAB — POC COVID19 BINAXNOW: SARS Coronavirus 2 Ag: NEGATIVE

## 2023-01-28 MED ORDER — AMOXICILLIN-POT CLAVULANATE 875-125 MG PO TABS
1.0000 | ORAL_TABLET | Freq: Two times a day (BID) | ORAL | 0 refills | Status: DC
Start: 2023-01-28 — End: 2023-03-18

## 2023-01-28 MED ORDER — FLUTICASONE PROPIONATE 50 MCG/ACT NA SUSP
2.0000 | Freq: Every day | NASAL | 6 refills | Status: DC
Start: 2023-01-28 — End: 2023-03-18

## 2023-01-28 MED ORDER — BENZONATATE 100 MG PO CAPS
200.0000 mg | ORAL_CAPSULE | Freq: Three times a day (TID) | ORAL | 0 refills | Status: DC | PRN
Start: 2023-01-28 — End: 2023-03-18

## 2023-01-28 NOTE — Assessment & Plan Note (Signed)
Augmentin x 10 days  Flonase and antihistamine Tessalon perles as needed Return to office as needed  Covid and strep negative

## 2023-01-28 NOTE — Progress Notes (Signed)
Established Patient Office Visit  Subjective   Patient ID: Brandon Acosta, male    DOB: 1940-12-26  Age: 82 y.o. MRN: [id number]  Chief Complaint  Patient presents with   Sore Throat    Sxs since May 3rd. Pt states having sore throat, pain with swallowing, productive cough, sinus pressure, no fever. No OTC meds    HPI Pt is here c/o congestion and cough since Friday with sore throat .  Pt wife is sick as well.   She was treated last week for congestion and was but on amoxil.   Pt is not takin anything otc except fishermans cough drops.   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/2018   Hematuria 09/04/2018   BPH (benign prostatic hyperplasia) 09/04/2018   Sepsis secondary to UTI (HCC) 09/04/2018   UTI (urinary tract infection) 09/04/2018   Abnormal ECG during exercise stress test    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HCC)    Arthritis    Diverticulitis    GERD (gastroesophageal reflux disease)    Hyperlipemia    Hyperlipidemia    Osteoporosis    Palpitations    Past Surgical History:  Procedure Laterality Date   APPENDECTOMY  as child   CARDIAC CATHETERIZATION N/A 10/16/2016   Procedure: Left Heart Cath and Coronary Angiography;  Surgeon: Corky Crafts, MD;  Location: [facility] INVASIVE CV LAB;  Service: Cardiovascular;  Laterality: N/A;   COLONOSCOPY WITH PROPOFOL N/A 02/11/2014   Procedure: COLONOSCOPY WITH PROPOFOL;  Surgeon: Florencia Reasons, MD;  Location: [facility] ENDOSCOPY;  Service: Endoscopy;  Laterality: N/A;   KNEE ARTHROSCOPY Right 2013   torn meniscus repair   Social History   Tobacco Use   Smoking status: Former    Packs/day: 0.50     Years: 20.00    Additional pack years: 0.00    Total pack years: 10.00    Types: Cigarettes    Quit date: 09/24/1976    Years since quitting: 46.3   Smokeless tobacco: Never  Vaping Use   Vaping Use: Never used  Substance Use Topics   Alcohol use: Yes    Comment: occassionally 1 glass wine per day   Drug use: No   Social History   Socioeconomic History   Marital status: Married    Spouse name: Not on file   Number of children: Not on file   Years of education: Not on file   Highest education level: Not on file  Occupational History   Not on file  Tobacco Use   Smoking status: Former    Packs/day: 0.50    Years: 20.00    Additional pack years: 0.00    Total pack years: 10.00    Types: Cigarettes    Quit date: 09/24/1976    Years since quitting: 46.3   Smokeless tobacco: Never  Vaping Use   Vaping Use: Never used  Substance and Sexual Activity   Alcohol use: Yes    Comment: occassionally 1 glass wine per day   Drug use: No   Sexual activity: Not on file  Other Topics Concern   Not on file  Social History Narrative  Lives with wife   Caffeine use: Tea daily   Right handed    Social Determinants of Health   Financial Resource Strain: Low Risk  (01/15/2022)   Overall Financial Resource Strain (CARDIA)    Difficulty of Paying Living Expenses: Not hard at all  Food Insecurity: No Food Insecurity (01/15/2022)   Hunger Vital Sign    Worried About Running Out of Food in the Last Year: Never true    Ran Out of Food in the Last Year: Never true  Transportation Needs: No Transportation Needs (01/15/2022)   PRAPARE - Administrator, Civil Service (Medical): No    Lack of Transportation (Non-Medical): No  Physical Activity: Sufficiently Active (01/15/2022)   Exercise Vital Sign    Days of Exercise per Week: 7 days    Minutes of Exercise per Session: 30 min  Stress: No Stress Concern Present (01/15/2022)   [employer] of Occupational Health - Occupational  Stress Questionnaire    Feeling of Stress : Not at all  Social Connections: Socially Integrated (01/15/2022)   Social Connection and Isolation Panel [NHANES]    Frequency of Communication with Friends and Family: More than three times a week    Frequency of Social Gatherings with Friends and Family: More than three times a week    Attends Religious Services: More than 4 times per year    Active Member of [employer] or Organizations: Yes    Attends Engineer, structural: More than 4 times per year    Marital Status: Married  Catering manager Violence: Not At Risk (01/15/2022)   Humiliation, Afraid, Rape, and Kick questionnaire    Fear of Current or Ex-Partner: No    Emotionally Abused: No    Physically Abused: No    Sexually Abused: No   Family Status  Relation Name Status   Mother  Deceased   Father  Deceased   Sister  Deceased   Sister  Alive   Sister  Deceased   Brother  Alive   Brother  Alive   Family History  Problem Relation Age of Onset   Alcoholism Mother    Breast cancer Mother    Heart disease Mother    Cancer - Lung Father    Breast cancer Sister    Cancer - Ovarian Sister    Breast cancer Sister    Tremor Sister    Allergies  Allergen Reactions   Simvastatin Other (See Comments)      Review of Systems  Constitutional:  Negative for chills, fever and malaise/fatigue.  HENT:  Positive for congestion, sinus pain and sore throat.   Eyes:  Negative for blurred vision.  Respiratory:  Positive for cough and sputum production. Negative for shortness of breath and wheezing.   Cardiovascular:  Negative for chest pain, palpitations and leg swelling.  Gastrointestinal:  Negative for abdominal pain, blood in stool, heartburn and nausea.  Genitourinary:  Negative for dysuria and frequency.  Musculoskeletal:  Negative for falls.  Skin:  Negative for rash.  Neurological:  Negative for dizziness, loss of consciousness and headaches.  Endo/Heme/Allergies:  Negative  for environmental allergies.  Psychiatric/Behavioral:  Negative for depression. The patient is not nervous/anxious.       Objective:     BP 100/68 (BP Location: Left Arm, Patient Position: Sitting, Cuff Size: Normal)   Pulse 68   Temp 98.1 F (36.7 C) (Oral)   Resp 18   Ht 5\' 7"  (1.702 m)   Wt 144 lb 3.2  oz (65.4 kg)   SpO2 94%   BMI 22.58 kg/m  BP Readings from Last 3 Encounters:  01/28/23 100/68  11/20/22 112/68  10/26/22 112/62   Wt Readings from Last 3 Encounters:  01/28/23 144 lb 3.2 oz (65.4 kg)  11/20/22 143 lb 6.4 oz (65 kg)  10/26/22 144 lb 9.6 oz (65.6 kg)   SpO2 Readings from Last 3 Encounters:  01/28/23 94%  11/20/22 98%  10/26/22 98%      Physical Exam Vitals and nursing note reviewed.  Constitutional:      Appearance: He is well-developed.  HENT:     Head: Normocephalic and atraumatic.  Eyes:     Pupils: Pupils are equal, round, and reactive to light.  Neck:     Thyroid: No thyromegaly.  Cardiovascular:     Rate and Rhythm: Normal rate and regular rhythm.     Heart sounds: No murmur heard. Pulmonary:     Effort: Pulmonary effort is normal. No respiratory distress.     Breath sounds: Normal breath sounds. No wheezing or rales.  Chest:     Chest wall: No tenderness.  Abdominal:     Palpations: Abdomen is soft.  Musculoskeletal:        General: No tenderness.     Cervical back: Normal range of motion and neck supple.  Skin:    General: Skin is warm and dry.  Neurological:     Mental Status: He is alert and oriented to person, place, and time.  Psychiatric:        Behavior: Behavior normal.        Thought Content: Thought content normal.        Judgment: Judgment normal.      Results for orders placed or performed in visit on 01/28/23  POC COVID-19  Result Value Ref Range   SARS Coronavirus 2 Ag Negative Negative  POCT rapid strep A  Result Value Ref Range   Rapid Strep A Screen Negative Negative    Last CBC Lab Results   Component Value Date   WBC 4.8 10/26/2022   HGB 14.2 10/26/2022   HCT 41.9 10/26/2022   MCV 99.9 10/26/2022   MCH 33.6 09/14/2021   RDW 13.4 10/26/2022   PLT 238.0 10/26/2022   Last metabolic panel Lab Results  Component Value Date   GLUCOSE 100 (H) 10/26/2022   NA 140 10/26/2022   K 4.2 10/26/2022   CL 106 10/26/2022   CO2 29 10/26/2022   BUN 18 10/26/2022   CREATININE 0.69 10/26/2022   GFRNONAA >60 09/14/2021   CALCIUM 8.6 10/26/2022   PROT 6.0 10/26/2022   ALBUMIN 3.7 10/26/2022   BILITOT 0.4 10/26/2022   ALKPHOS 93 10/26/2022   AST 15 10/26/2022   ALT 16 10/26/2022   ANIONGAP 6 09/14/2021   Last lipids Lab Results  Component Value Date   CHOL 167 10/26/2022   HDL 54.00 10/26/2022   LDLCALC 100 (H) 10/26/2022   TRIG 64.0 10/26/2022   CHOLHDL 3 10/26/2022   Last hemoglobin A1c Lab Results  Component Value Date   HGBA1C 5.8 08/01/2021   Last thyroid functions Lab Results  Component Value Date   TSH 1.305 09/14/2021   Last vitamin D No results found for: "25OHVITD2", "25OHVITD3", "VD25OH" Last vitamin B12 and Folate Lab Results  Component Value Date   VITAMINB12 1,074 (H) 10/27/2021   FOLATE 14.7 09/06/2018      The ASCVD Risk score (Arnett DK, et al., 2019) failed to calculate for the  following reasons:   The 2019 ASCVD risk score is only valid for ages 9 to 3    Assessment & Plan:   Problem List Items Addressed This Visit       Unprioritized   Acute non-recurrent pansinusitis - Primary    Augmentin x 10 days  Flonase and antihistamine Tessalon perles as needed Return to office as needed  Covid and strep negative       Relevant Medications   amoxicillin-clavulanate (AUGMENTIN) 875-125 MG tablet   benzonatate (TESSALON PERLES) 100 MG capsule   fluticasone (FLONASE) 50 MCG/ACT nasal spray   Other Visit Diagnoses     Acute cough       Relevant Medications   benzonatate (TESSALON PERLES) 100 MG capsule   Other Relevant Orders    POC COVID-19 (Completed)   Sore throat       Relevant Orders   POCT rapid strep A (Completed)       No follow-ups on file.    Donato Schultz, DO

## 2023-02-05 ENCOUNTER — Telehealth: Payer: Self-pay | Admitting: Orthopaedic Surgery

## 2023-02-05 NOTE — Telephone Encounter (Signed)
Patient called asked if the gel injection has been approved? The number to contact patient is [phone]

## 2023-02-06 ENCOUNTER — Other Ambulatory Visit: Payer: Self-pay

## 2023-02-06 DIAGNOSIS — M1712 Unilateral primary osteoarthritis, left knee: Secondary | ICD-10-CM

## 2023-02-06 DIAGNOSIS — M1711 Unilateral primary osteoarthritis, right knee: Secondary | ICD-10-CM

## 2023-02-06 NOTE — Telephone Encounter (Signed)
Talked with patient and appointment has been scheduled.  

## 2023-03-04 ENCOUNTER — Ambulatory Visit: Payer: BLUE CROSS/BLUE SHIELD | Admitting: Orthopedic Surgery

## 2023-03-04 ENCOUNTER — Ambulatory Visit: Payer: BLUE CROSS/BLUE SHIELD | Admitting: Physician Assistant

## 2023-03-11 ENCOUNTER — Ambulatory Visit (INDEPENDENT_AMBULATORY_CARE_PROVIDER_SITE_OTHER): Payer: Medicare Other | Admitting: Orthopaedic Surgery

## 2023-03-11 ENCOUNTER — Encounter: Payer: Self-pay | Admitting: Orthopaedic Surgery

## 2023-03-11 DIAGNOSIS — M1711 Unilateral primary osteoarthritis, right knee: Secondary | ICD-10-CM

## 2023-03-11 DIAGNOSIS — M1712 Unilateral primary osteoarthritis, left knee: Secondary | ICD-10-CM

## 2023-03-11 MED ORDER — HYALURONAN 30 MG/2ML IX SOSY
30.0000 mg | PREFILLED_SYRINGE | INTRA_ARTICULAR | Status: AC | PRN
Start: 2023-03-11 — End: 2023-03-11
  Administered 2023-03-11: 30 mg via INTRA_ARTICULAR

## 2023-03-11 NOTE — Progress Notes (Signed)
   Procedure Note  Patient: Brandon Acosta             Date of Birth: 1941-09-07           MRN: [id number]             Visit Date: 03/11/2023  Procedures: Visit Diagnoses:  1. Primary osteoarthritis of left knee   2. Primary osteoarthritis of right knee     Large Joint Inj: R knee on 03/11/2023 8:51 AM Indications: diagnostic evaluation and pain Details: 22 G 1.5 in needle, superolateral approach  Arthrogram: No  Medications: 30 mg Hyaluronan 30 MG/2ML Outcome: tolerated well, no immediate complications Procedure, treatment alternatives, risks and benefits explained, specific risks discussed. Consent was given by the patient. Immediately prior to procedure a time out was called to verify the correct patient, procedure, equipment, support staff and site/side marked as required. Patient was prepped and draped in the usual sterile fashion.    Large Joint Inj: L knee on 03/11/2023 8:51 AM Indications: diagnostic evaluation and pain Details: 22 G 1.5 in needle, superolateral approach  Arthrogram: No  Medications: 30 mg Hyaluronan 30 MG/2ML Outcome: tolerated well, no immediate complications Procedure, treatment alternatives, risks and benefits explained, specific risks discussed. Consent was given by the patient. Immediately prior to procedure a time out was called to verify the correct patient, procedure, equipment, support staff and site/side marked as required. Patient was prepped and draped in the usual sterile fashion.    The patient comes in today for injection #1 of a series of 3 hyaluronic acid injections in both knees to treat the pain from osteoarthritis.  He is 82 years old and has well-documented osteoarthritis in both knees.  The injections are with Orthovisc.  He is doing well otherwise.  He does have arthritic knees with no effusion.  Both knees have varus malalignment.  I did place Orthovisc No. 1 in both knees today.  We will see him back for injection #2 in both knees  in a week.  He did tolerate the injections well.  Lot #[id number]

## 2023-03-18 ENCOUNTER — Ambulatory Visit (INDEPENDENT_AMBULATORY_CARE_PROVIDER_SITE_OTHER): Payer: Medicare Other | Admitting: Physician Assistant

## 2023-03-18 ENCOUNTER — Encounter: Payer: Self-pay | Admitting: Physician Assistant

## 2023-03-18 DIAGNOSIS — M1712 Unilateral primary osteoarthritis, left knee: Secondary | ICD-10-CM | POA: Diagnosis not present

## 2023-03-18 DIAGNOSIS — M1711 Unilateral primary osteoarthritis, right knee: Secondary | ICD-10-CM

## 2023-03-18 MED ORDER — HYALURONAN 30 MG/2ML IX SOSY
30.0000 mg | PREFILLED_SYRINGE | INTRA_ARTICULAR | Status: AC | PRN
Start: 2023-03-18 — End: 2023-03-18
  Administered 2023-03-18: 30 mg via INTRA_ARTICULAR

## 2023-03-18 NOTE — Progress Notes (Signed)
   Procedure Note  Patient: Brandon Acosta             Date of Birth: Nov 14, 1940           MRN: [id number]             Visit Date: 03/18/2023  HPI: Mr. Brandon Acosta comes in today for his second of 3 bilateral knee Orthovisc injections.  He had no adverse reaction to the last injections.  He has had no significant relief knee pain as of yet.  Physical exam: Bilateral knees good range of motion no abnormal warmth erythema or effusion.  Procedures: Visit Diagnoses:  1. Primary osteoarthritis of left knee   2. Primary osteoarthritis of right knee     Large Joint Inj: bilateral knee on 03/18/2023 11:12 AM Indications: pain Details: 22 G 1.5 in needle, anterolateral approach  Arthrogram: No  Medications (Right): 30 mg Hyaluronan 30 MG/2ML Medications (Left): 30 mg Hyaluronan 30 MG/2ML Outcome: tolerated well, no immediate complications Procedure, treatment alternatives, risks and benefits explained, specific risks discussed. Consent was given by the patient. Immediately prior to procedure a time out was called to verify the correct patient, procedure, equipment, support staff and site/side marked as required. Patient was prepped and draped in the usual sterile fashion.    Plan: He will follow-up with [place] in 1 week for his third set of Orthovisc injections.  He tolerated the injections well today.  Questions encouraged and answered.

## 2023-03-25 ENCOUNTER — Encounter: Payer: Self-pay | Admitting: Physician Assistant

## 2023-03-25 ENCOUNTER — Ambulatory Visit (INDEPENDENT_AMBULATORY_CARE_PROVIDER_SITE_OTHER): Payer: Medicare Other | Admitting: Physician Assistant

## 2023-03-25 DIAGNOSIS — M1712 Unilateral primary osteoarthritis, left knee: Secondary | ICD-10-CM | POA: Diagnosis not present

## 2023-03-25 DIAGNOSIS — M1711 Unilateral primary osteoarthritis, right knee: Secondary | ICD-10-CM

## 2023-03-25 MED ORDER — HYALURONAN 30 MG/2ML IX SOSY
30.0000 mg | PREFILLED_SYRINGE | INTRA_ARTICULAR | Status: AC | PRN
Start: 2023-03-25 — End: 2023-03-25
  Administered 2023-03-25: 30 mg via INTRA_ARTICULAR

## 2023-03-25 NOTE — Progress Notes (Signed)
   Procedure Note  Patient: Brandon Acosta             Date of Birth: [birth year]           MRN: [id number]             Visit Date: 03/25/2023 HPI: Brandon Acosta off returns today for bilateral Orthovisc injections third of that set.  He has had no adverse reaction to the injections.  No real benefit as of yet.  Physical exam: Bilateral knees good range of motion.  No abnormal warmth erythema or effusion. Procedures: Visit Diagnoses:  1. Primary osteoarthritis of left knee   2. Primary osteoarthritis of right knee     Large Joint Inj on 03/25/2023 7:00 PM Indications: pain Details: 22 G 1.5 in needle, anterolateral approach  Arthrogram: No  Medications: 30 mg Hyaluronan 30 MG/2ML Outcome: tolerated well, no immediate complications Procedure, treatment alternatives, risks and benefits explained, specific risks discussed. Consent was given by the patient. Immediately prior to procedure a time out was called to verify the correct patient, procedure, equipment, support staff and site/side marked as required. Patient was prepped and draped in the usual sterile fashion.     Plan: He knows to wait at least 6 months between injections with viscosupplementation.  Tolerated injection well today.  Follow-up as needed

## 2023-04-19 DIAGNOSIS — N200 Calculus of kidney: Secondary | ICD-10-CM | POA: Diagnosis not present

## 2023-05-23 ENCOUNTER — Ambulatory Visit: Payer: BLUE CROSS/BLUE SHIELD | Admitting: Pulmonary Disease

## 2023-05-24 ENCOUNTER — Ambulatory Visit (INDEPENDENT_AMBULATORY_CARE_PROVIDER_SITE_OTHER): Payer: Medicare Other | Admitting: Pulmonary Disease

## 2023-05-24 ENCOUNTER — Encounter: Payer: Self-pay | Admitting: Pulmonary Disease

## 2023-05-24 VITALS — BP 126/78 | HR 66 | Ht 68.0 in | Wt 144.6 lb

## 2023-05-24 DIAGNOSIS — R9389 Abnormal findings on diagnostic imaging of other specified body structures: Secondary | ICD-10-CM

## 2023-05-24 DIAGNOSIS — R1310 Dysphagia, unspecified: Secondary | ICD-10-CM | POA: Diagnosis not present

## 2023-05-24 NOTE — Patient Instructions (Addendum)
We will arrange your CT chest to be done in the next couple of weeks or month.  Follow up in November to review CT Chest scan

## 2023-05-24 NOTE — Progress Notes (Signed)
Synopsis: Referred in December 2022 for Abnormal CT Lung by Allyne Gee, FNP  Subjective:   PATIENT ID: Brandon Acosta GENDER: male DOB: March 22, 1941, MRN: [id number]  HPI  Chief Complaint  Patient presents with   Acute Visit    Pt states he no longer has a cold, but pt states he smells an order and wants to push his ct up    Brandon Acosta is a 82 year old male, former smoker with GERD and BPH who returns to pulmonary clinic for abnormal CT Chest scan.  They recently moved into independent living facility and down sized. He has been feeling well. He reports a 5lbs weight loss due to stress of the move and has gained the weight back. He has been drinking liquids through a straw and has not had much issues with cough or aspiration.   Repeat CT Chest 08/24/22 shows stable tree in bud infiltrates of the anterior RUL.   Video visit 10/16/21 Patient was previously seen on 09/01/2021 for mild areas of scattered micro nodularity of the right lung possibly related to an indolent mycobacterial infection or other atypical infection.  At that time he was asymptomatic and the plan was to follow-up in 1 year with a repeat CT chest scan.   He went to the ER on 09/14/2021 for symptoms of chest pain in which a CTA chest was performed which showed unchanged clusters of small nodules in the right middle lobe.  He then had CT cardiac scan on 10/12/2021 mentioning of changes in size of the nodularity of the right lung.   I reviewed the imaging with the patient and his wife via shared screen.  The patient does report concerns for dysphagia and coughing with meals.  Initial OV 09/01/21 Patient reported having hematuria last month in which a CT abdomen pelvis was performed noting renal stones.  The CT abdomen was also notable for areas of clustered micro nodularity of the right middle lobe in which a CT chest scan was ordered by his primary care team for further evaluation of these abnormalities.  The CT  chest scan again showed mild clustered micro nodularity in the right middle lobe and inferior right upper lobe similar to recent abdominal CT imaging.  There is also a small granuloma in the right upper lobe.  Patient reports hematuria has resolved and there were no abnormal findings on cystoscopy except for nephrolithiasis.  Patient denies any respiratory symptoms of cough, wheezing, shortness of breath, sputum production or chest discomfort.  He denies any fevers, chills, night sweats loss of appetite or weight loss.  Patient is a former smoker and quit in 1978.  He is currently retired denies any harmful dust or chemical exposures throughout his occupational history.   Today OV 05/24/23 He recently had sore throat, cough and mucous production with associated olfactory changes. He reports having a burnt butter smell that has been slowly diminishing. He denies any ongoing symptoms from his recent infection. He tested negative for covid and strep a day after symptom onset. He completed a course of amoxicillin without improvement in symptoms. He reports he recovered after about 6 weeks.   He is requesting to move up his CT Chest scan for monitoring of tree-in-bud infiltrates.  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HCC)    Arthritis    Diverticulitis    GERD (gastroesophageal reflux disease)    Hyperlipemia    Hyperlipidemia    Osteoporosis    Palpitations  Family History  Problem Relation Age of Onset   Alcoholism Mother    Breast cancer Mother    Heart disease Mother    Cancer - Lung Father    Breast cancer Sister    Cancer - Ovarian Sister    Breast cancer Sister    Tremor Sister      Social History   Socioeconomic History   Marital status: Married    Spouse name: Not on file   Number of children: Not on file   Years of education: Not on file   Highest education level: Not on file  Occupational History   Not on file  Tobacco Use   Smoking  status: Former    Current packs/day: 0.00    Average packs/day: 0.5 packs/day for 20.0 years (10.0 ttl pk-yrs)    Types: Cigarettes    Start date: 09/24/1956    Quit date: 09/24/1976    Years since quitting: 46.6   Smokeless tobacco: Never  Vaping Use   Vaping status: Never Used  Substance and Sexual Activity   Alcohol use: Yes    Comment: occassionally 1 glass wine per day   Drug use: No   Sexual activity: Not on file  Other Topics Concern   Not on file  Social History Narrative   Lives with wife   Caffeine use: Tea daily   Right handed    Social Determinants of Health   Financial Resource Strain: Low Risk  (01/15/2022)   Overall Financial Resource Strain (CARDIA)    Difficulty of Paying Living Expenses: Not hard at all  Food Insecurity: No Food Insecurity (01/15/2022)   Hunger Vital Sign    Worried About Running Out of Food in the Last Year: Never true    Ran Out of Food in the Last Year: Never true  Transportation Needs: No Transportation Needs (01/15/2022)   PRAPARE - Administrator, Civil Service (Medical): No    Lack of Transportation (Non-Medical): No  Physical Activity: Sufficiently Active (01/15/2022)   Exercise Vital Sign    Days of Exercise per Week: 7 days    Minutes of Exercise per Session: 30 min  Stress: No Stress Concern Present (01/15/2022)   [employer] of Occupational Health - Occupational Stress Questionnaire    Feeling of Stress : Not at all  Social Connections: Socially Integrated (01/15/2022)   Social Connection and Isolation Panel [NHANES]    Frequency of Communication with Friends and Family: More than three times a week    Frequency of Social Gatherings with Friends and Family: More than three times a week    Attends Religious Services: More than 4 times per year    Active Member of [employer] or Organizations: Yes    Attends Engineer, structural: More than 4 times per year    Marital Status: Married  Catering manager Violence:  Not At Risk (01/15/2022)   Humiliation, Afraid, Rape, and Kick questionnaire    Fear of Current or Ex-Partner: No    Emotionally Abused: No    Physically Abused: No    Sexually Abused: No     Allergies  Allergen Reactions   Simvastatin Other (See Comments)     Outpatient Medications Prior to Visit  Medication Sig Dispense Refill   ezetimibe (ZETIA) 10 MG tablet Take 1 tablet (10 mg total) by mouth daily. 90 tablet 3   ibuprofen (ADVIL,MOTRIN) 200 MG tablet Take 400 mg by mouth daily as needed for fever or moderate pain.  omeprazole (PRILOSEC) 20 MG capsule Take 20 mg by mouth daily.     oxymetazoline (AFRIN) 0.05 % nasal spray Place 1 spray into both nostrils 2 (two) times daily.     No facility-administered medications prior to visit.   Review of Systems  Constitutional:  Negative for chills, fever, malaise/fatigue and weight loss.  HENT:  Negative for congestion, sinus pain and sore throat.   Eyes: Negative.   Respiratory:  Negative for cough, hemoptysis, sputum production, shortness of breath and wheezing.   Cardiovascular:  Negative for chest pain, palpitations, orthopnea, claudication and leg swelling.  Gastrointestinal:  Negative for abdominal pain, heartburn, nausea and vomiting.  Genitourinary: Negative.   Musculoskeletal:  Negative for joint pain and myalgias.  Skin:  Negative for rash.  Neurological:  Negative for weakness.  Endo/Heme/Allergies: Negative.   Psychiatric/Behavioral: Negative.      Objective:   Vitals:   05/24/23 1131  BP: 126/78  Pulse: 66  SpO2: 97%  Weight: 144 lb 9.6 oz (65.6 kg)  Height: 5\' 8"  (1.727 m)   Physical Exam Constitutional:      General: He is not in acute distress. HENT:     Head: Normocephalic and atraumatic.  Eyes:     Conjunctiva/sclera: Conjunctivae normal.  Cardiovascular:     Rate and Rhythm: Normal rate and regular rhythm.     Pulses: Normal pulses.     Heart sounds: Normal heart sounds. No murmur  heard. Pulmonary:     Effort: Pulmonary effort is normal.     Breath sounds: Normal breath sounds.  Musculoskeletal:     Right lower leg: No edema.     Left lower leg: No edema.  Skin:    General: Skin is warm and dry.  Neurological:     Mental Status: He is alert.    CBC    Component Value Date/Time   WBC 4.8 10/26/2022 0927   RBC 4.19 (L) 10/26/2022 0927   HGB 14.2 10/26/2022 0927   HCT 41.9 10/26/2022 0927   PLT 238.0 10/26/2022 0927   MCV 99.9 10/26/2022 0927   MCH 33.6 09/14/2021 1244   MCHC 33.9 10/26/2022 0927   RDW 13.4 10/26/2022 0927   LYMPHSABS 1.2 10/26/2022 0927   MONOABS 0.6 10/26/2022 0927   EOSABS 0.1 10/26/2022 0927   BASOSABS 0.0 10/26/2022 0927      Latest Ref Rng & Units 10/26/2022    9:27 AM 05/29/2022   10:53 AM 09/14/2021   12:44 PM  BMP  Glucose 70 - 99 mg/dL 161  92  096   BUN 6 - 23 mg/dL 18  21  18    Creatinine 0.40 - 1.50 mg/dL 0.45  4.09  8.11   Sodium 135 - 145 mEq/L 140  138  136   Potassium 3.5 - 5.1 mEq/L 4.2  4.2  3.8   Chloride 96 - 112 mEq/L 106  103  102   CO2 19 - 32 mEq/L 29  27  28    Calcium 8.4 - 10.5 mg/dL 8.6  9.0  8.9    Chest imaging: CT Chest 08/24/22 Cardiovascular: No significant vascular findings. Normal heart size. No pericardial effusion. There are atherosclerotic calcifications of the aorta.   Mediastinum/Nodes: No enlarged mediastinal or axillary lymph nodes. Thyroid gland, trachea, and esophagus demonstrate no significant findings.   Lungs/Pleura: There is minimal tree-in-bud opacities in the right middle lobe and right upper lobe. There is no pleural effusion or pneumothorax. The lungs are otherwise clear.  CT Chest Scan  08/25/21 No acute upper abdominal findings.   Mild clustered micro nodularity in the RIGHT middle lobe and inferior RIGHT upper lobe, similar to recent abdominal CT imaging. Findings are nonspecific but can be seen in the setting of atypical infection such as MAI.   Cholelithiasis.    Nephrolithiasis with a 3-4 mm calculus in the upper pole the LEFT kidney.   Aortic atherosclerosis.   Aortic Atherosclerosis (ICD10-I70.0).   PFT:     No data to display          Labs:  Path:  Echo:  Heart Catheterization:  Modified Barium Swallow 11/28/21 - Mild pharyngeal dysphagia c/b decreased laryngeal closure and impaired elevation resulting in consistent laryngeal penetration of thin liquids. Chin tuck posture decreased penetrates significantly but still allowed minimal amount.  - Recommend pt continue diet as tolerated, implementing chin tuck posture with liquids.    Assessment & Plan:   Abnormal CT of the chest  Dysphagia, unspecified type  Discussion: Brandon Acosta is a 82 year old male, former smoker with GERD and BPH who returns to pulmonary clinic for abnormal CT Chest scan.  Patient has very mild areas of scattered micro nodularity of the right lung which may represent indolent mycobacterial infection or other atypical infection which remain stable on follow up CT Chest scan.  Patient is asymptomatic at this time. He has dysphagia with liquids which has improved after evaluation with speech therapy.  We will move up the CT Chest scan planned for December to the coming weeks.   Follow up in 3 months.  Melody Comas, MD [facility] Office: [phone]   Current Outpatient Medications:    ezetimibe (ZETIA) 10 MG tablet, Take 1 tablet (10 mg total) by mouth daily., Disp: 90 tablet, Rfl: 3   ibuprofen (ADVIL,MOTRIN) 200 MG tablet, Take 400 mg by mouth daily as needed for fever or moderate pain., Disp: , Rfl:    omeprazole (PRILOSEC) 20 MG capsule, Take 20 mg by mouth daily., Disp: , Rfl:    oxymetazoline (AFRIN) 0.05 % nasal spray, Place 1 spray into both nostrils 2 (two) times daily., Disp: , Rfl:

## 2023-05-28 ENCOUNTER — Ambulatory Visit (HOSPITAL_BASED_OUTPATIENT_CLINIC_OR_DEPARTMENT_OTHER)
Admission: RE | Admit: 2023-05-28 | Discharge: 2023-05-28 | Disposition: A | Discharge status: Home / Self Care | Payer: Medicare Other | Source: Ambulatory Visit | Attending: Pulmonary Disease | Admitting: Pulmonary Disease

## 2023-05-28 DIAGNOSIS — R9389 Abnormal findings on diagnostic imaging of other specified body structures: Secondary | ICD-10-CM | POA: Diagnosis not present

## 2023-05-28 DIAGNOSIS — I700 Atherosclerosis of aorta: Secondary | ICD-10-CM | POA: Diagnosis not present

## 2023-05-28 DIAGNOSIS — R918 Other nonspecific abnormal finding of lung field: Secondary | ICD-10-CM | POA: Diagnosis not present

## 2023-05-31 ENCOUNTER — Telehealth: Payer: Self-pay | Admitting: Family

## 2023-05-31 ENCOUNTER — Other Ambulatory Visit: Payer: Self-pay | Admitting: Family

## 2023-05-31 DIAGNOSIS — N400 Enlarged prostate without lower urinary tract symptoms: Secondary | ICD-10-CM

## 2023-05-31 DIAGNOSIS — Z0000 Encounter for general adult medical examination without abnormal findings: Secondary | ICD-10-CM

## 2023-05-31 DIAGNOSIS — E7800 Pure hypercholesterolemia, unspecified: Secondary | ICD-10-CM

## 2023-05-31 NOTE — Telephone Encounter (Signed)
Called pt and scheduled a lab appointment 06/05/2023.

## 2023-05-31 NOTE — Telephone Encounter (Signed)
Pt has a yearly exam scheduled 9/20. He would like to get his labs ordered prior to his appt. Please advise once orders are in.

## 2023-06-05 ENCOUNTER — Other Ambulatory Visit (INDEPENDENT_AMBULATORY_CARE_PROVIDER_SITE_OTHER): Payer: Medicare Other

## 2023-06-05 DIAGNOSIS — N400 Enlarged prostate without lower urinary tract symptoms: Secondary | ICD-10-CM | POA: Diagnosis not present

## 2023-06-05 DIAGNOSIS — Z0000 Encounter for general adult medical examination without abnormal findings: Secondary | ICD-10-CM | POA: Diagnosis not present

## 2023-06-05 DIAGNOSIS — E7800 Pure hypercholesterolemia, unspecified: Secondary | ICD-10-CM | POA: Diagnosis not present

## 2023-06-05 LAB — CBC WITH DIFFERENTIAL/PLATELET
Basophils Absolute: 0 10*3/uL (ref 0.0–0.1)
Basophils Relative: 0.5 % (ref 0.0–3.0)
Eosinophils Absolute: 0.1 10*3/uL (ref 0.0–0.7)
Eosinophils Relative: 2.3 % (ref 0.0–5.0)
HCT: 45.2 % (ref 39.0–52.0)
Hemoglobin: 14.8 g/dL (ref 13.0–17.0)
Lymphocytes Relative: 38.5 % (ref 12.0–46.0)
Lymphs Abs: 2 10*3/uL (ref 0.7–4.0)
MCHC: 32.7 g/dL (ref 30.0–36.0)
MCV: 100.9 fl — ABNORMAL HIGH (ref 78.0–100.0)
Monocytes Absolute: 0.9 10*3/uL (ref 0.1–1.0)
Monocytes Relative: 16.5 % — ABNORMAL HIGH (ref 3.0–12.0)
Neutro Abs: 2.2 10*3/uL (ref 1.4–7.7)
Neutrophils Relative %: 42.2 % — ABNORMAL LOW (ref 43.0–77.0)
Platelets: 232 10*3/uL (ref 150.0–400.0)
RBC: 4.47 Mil/uL (ref 4.22–5.81)
RDW: 13.6 % (ref 11.5–15.5)
WBC: 5.3 10*3/uL (ref 4.0–10.5)

## 2023-06-05 LAB — LIPID PANEL
Cholesterol: 162 mg/dL (ref 0–200)
HDL: 61.6 mg/dL (ref >39.00)
LDL Cholesterol: 88 mg/dL (ref 0–99)
NonHDL: 100.3
Total CHOL/HDL Ratio: 3
Triglycerides: 63 mg/dL (ref 0.0–149.0)
VLDL: 12.6 mg/dL (ref 0.0–40.0)

## 2023-06-05 LAB — COMPREHENSIVE METABOLIC PANEL
ALT: 15 U/L (ref 0–53)
AST: 19 U/L (ref 0–37)
Albumin: 3.9 g/dL (ref 3.5–5.2)
Alkaline Phosphatase: 91 U/L (ref 39–117)
BUN: 23 mg/dL (ref 6–23)
CO2: 31 meq/L (ref 19–32)
Calcium: 9.2 mg/dL (ref 8.4–10.5)
Chloride: 103 meq/L (ref 96–112)
Creatinine, Ser: 0.83 mg/dL (ref 0.40–1.50)
GFR: 82.13 mL/min (ref >60.00)
Glucose, Bld: 83 mg/dL (ref 70–99)
Potassium: 4.4 meq/L (ref 3.5–5.1)
Sodium: 139 meq/L (ref 135–145)
Total Bilirubin: 0.7 mg/dL (ref 0.2–1.2)
Total Protein: 6.4 g/dL (ref 6.0–8.3)

## 2023-06-05 LAB — PSA: PSA: 3.68 ng/mL (ref 0.10–4.00)

## 2023-06-06 ENCOUNTER — Encounter: Payer: Self-pay | Admitting: Pulmonary Disease

## 2023-06-09 DIAGNOSIS — Z23 Encounter for immunization: Secondary | ICD-10-CM | POA: Diagnosis not present

## 2023-06-14 ENCOUNTER — Ambulatory Visit (INDEPENDENT_AMBULATORY_CARE_PROVIDER_SITE_OTHER): Payer: Medicare Other | Admitting: Family

## 2023-06-14 ENCOUNTER — Encounter: Payer: Self-pay | Admitting: Family

## 2023-06-14 VITALS — BP 112/62 | HR 56 | Temp 97.7°F | Resp 18 | Ht 68.0 in | Wt 145.8 lb

## 2023-06-14 DIAGNOSIS — M1711 Unilateral primary osteoarthritis, right knee: Secondary | ICD-10-CM

## 2023-06-14 DIAGNOSIS — E785 Hyperlipidemia, unspecified: Secondary | ICD-10-CM

## 2023-06-14 DIAGNOSIS — N400 Enlarged prostate without lower urinary tract symptoms: Secondary | ICD-10-CM | POA: Diagnosis not present

## 2023-06-14 MED ORDER — EZETIMIBE 10 MG PO TABS: 10.0000 mg | ORAL_TABLET | Freq: Every day | ORAL | 3 refills | Status: AC

## 2023-06-14 NOTE — Progress Notes (Signed)
Brandon Acosta is a 82 y.o. 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     omeprazole (PRILOSEC) 20 MG capsule Take 20 mg by mouth daily.     oxymetazoline (AFRIN) 0.05 % nasal spray Place 1 spray into both nostrils 2 (two) times daily.     ezetimibe (ZETIA) 10 MG tablet Take 1 tablet (10 mg total) by mouth daily. 90 tablet 3   No current facility-administered medications for this visit.    Allergies: Simvastatin  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HCC)    Arthritis    Diverticulitis    GERD (gastroesophageal reflux disease)    Hyperlipemia    Hyperlipidemia    Osteoporosis    Palpitations     Past Surgical History:  Procedure Laterality Date   APPENDECTOMY  as child   CARDIAC CATHETERIZATION N/A 10/16/2016   Procedure: Left Heart Cath and Coronary Angiography;  Surgeon: Corky Crafts, MD;  Location: [facility] INVASIVE CV LAB;  Service: Cardiovascular;  Laterality: N/A;   COLONOSCOPY WITH PROPOFOL N/A 02/11/2014   Procedure: COLONOSCOPY WITH PROPOFOL;  Surgeon: Florencia Reasons, MD;  Location: [facility] ENDOSCOPY;  Service: Endoscopy;  Laterality: N/A;   KNEE ARTHROSCOPY Right 2013   torn meniscus repair    Family History  Problem Relation Age of Onset    Alcoholism Mother    Breast cancer Mother    Heart disease Mother    Cancer - Lung Father    Breast cancer Sister    Cancer - Ovarian Sister    Breast cancer Sister    Tremor Sister     Social History   Tobacco Use   Smoking status: Former    Current packs/day: 0.00    Average packs/day: 0.5 packs/day for 20.0 years (10.0 ttl pk-yrs)    Types: Cigarettes    Start date: 09/24/1956    Quit date: 09/24/1976    Years since quitting: 46.7   Smokeless tobacco: Never  Substance Use Topics   Alcohol use: Yes    Comment: occassionally 1 glass wine per day    Subjective:   Presents for yearly follow up on chronic care needs; labs were done prior to visit; will be meeting with orthopedist next week to discuss possible right knee replacement;  Sees urology yearly for monitoring kidney stone; sees cardiology and dermatology as well;  Got COVID vaccine last week; will be getting flu and RSV in October;  Taking his Zetia 3x per week;   Objective:  Vitals:   06/14/23 0910  BP: 112/62  Pulse: (!) 56  Resp: 18  Temp: 97.7 F (36.5 C)  TempSrc: Oral  SpO2: 97%  Weight: 145 lb 12.8 oz (66.1 kg)  Height: 5\' 8"  (1.727 m)    General: Well developed, well nourished, in no  acute distress  Skin : Warm and dry.  Head: Normocephalic and atraumatic  Eyes: Sclera and conjunctiva clear; pupils round and reactive to light; extraocular movements intact  Ears: External normal; canals clear; tympanic membranes normal  Oropharynx: Pink, supple. No suspicious lesions  Neck: Supple without thyromegaly, adenopathy  Lungs: Respirations unlabored; clear to auscultation bilaterally without wheeze, rales, rhonchi  CVS exam: normal rate and regular rhythm.  Abdomen: Soft; nontender; nondistended; normoactive bowel sounds; no masses or hepatosplenomegaly  Musculoskeletal: No deformities; no active joint inflammation  Extremities: No edema, cyanosis, clubbing  Vessels: Symmetric bilaterally  Neurologic: Alert  and oriented; speech intact; face symmetrical; moves all extremities well; CNII-XII intact without focal deficit   Assessment:  1. Hyperlipidemia, unspecified hyperlipidemia type   2. Benign prostatic hyperplasia, unspecified whether lower urinary tract symptoms present   3. Primary osteoarthritis of right knee     Plan:  Age appropriate preventive healthcare needs addressed; encouraged regular eye doctor and dental exams; encouraged regular exercise; will update refills as needed today;  Patient will be meeting with orthopedist next week to discuss knee replacement and reassurance that would clear him with low risk concerns;  He will use saline rinse x 2 months and if continued nasal odor persists, to consider ENT evaluation;    Return in about 1 year (around 06/13/2024) for follow up.  No orders of the defined types were placed in this encounter.   Requested Prescriptions   Signed Prescriptions Disp Refills   ezetimibe (ZETIA) 10 MG tablet 90 tablet 3    Sig: Take 1 tablet (10 mg total) by mouth daily.

## 2023-06-19 ENCOUNTER — Ambulatory Visit (INDEPENDENT_AMBULATORY_CARE_PROVIDER_SITE_OTHER): Payer: Medicare Other | Admitting: Orthopaedic Surgery

## 2023-06-19 ENCOUNTER — Encounter: Payer: Self-pay | Admitting: Orthopaedic Surgery

## 2023-06-19 ENCOUNTER — Other Ambulatory Visit (INDEPENDENT_AMBULATORY_CARE_PROVIDER_SITE_OTHER): Payer: Medicare Other

## 2023-06-19 VITALS — Ht 68.0 in | Wt 148.0 lb

## 2023-06-19 DIAGNOSIS — M1711 Unilateral primary osteoarthritis, right knee: Secondary | ICD-10-CM

## 2023-06-19 NOTE — Progress Notes (Signed)
The patient is an 82-year gentleman well-known to [place].  He has well-documented end-stage arthritis of his right knee and it has been slowly getting worse.  X-rays in 2022 showed tricompartment arthritis.  We did obtain new x-rays today of his right knee.  At this point his knee pain is daily and it is detrimentally affecting his mobility, his quality of life and his actives daily living.  He has had a remote history of right knee arthroscopy with a partial medial meniscectomy about 15 years ago.  He has tried and failed all forms conservative treatment including injections and activity modification.  He is a thin individual.  He exercises regularly and does strengthen his quad muscles.  It has been getting worse over the last 12 months.  I did review all of his medications and past medical history within epic.  He does not smoke.  He is not a diabetic.  He does not use illicit drugs.  On exam his right knee has varus malalignment that is correctable.  He has significant medial joint line tenderness and patellofemoral crepitation and tenderness.  The knee is ligamentously stable with full range of motion.  X-rays from today compared to x-rays from 2022 shows severe worsening end-stage arthritis of the right knee.  There is varus malalignment with bone-on-bone wear the medial compartment and the patellofemoral joint.  There are osteophytes in all 3 compartments.  We had a long and thorough discussion about knee replacement surgery.  We discussed the risks and benefits of the surgery and what to expect from an intraoperative and postoperative course.  I showed him a model of a knee replacement and went over his x-rays with him.  All question concerns were answered and addressed.  We will work on getting him scheduled for a right total knee arthroplasty which he hopes would be due in November or December of this year.

## 2023-07-11 ENCOUNTER — Other Ambulatory Visit: Payer: Self-pay

## 2023-07-16 NOTE — Progress Notes (Signed)
COVID Vaccine received:  []  No [x]  Yes Date of any COVID positive Test in last 90 days: no PCP - Olive Bass FNP Cardiologist -Dr. Donato Schultz   Chest x-ray - 05/28/23 Epic EKG -  07/18/23 Epic Stress Test -  ECHO - 09/27/21 Epic Cardiac Cath - 10/16/16 Epic  Bowel Prep - [x]  No  []   Yes ______  Pacemaker / ICD device [x]  No []  Yes   Spinal Cord Stimulator:[x]  No []  Yes       History of Sleep Apnea? [x]  No []  Yes   CPAP used?- [x]  No []  Yes    Does the patient monitor blood sugar?          [x]  No []  Yes  []  N/A  Patient has: [x]  NO Hx DM   []  Pre-DM                 []  DM1  []   DM2 Does patient have a [employer] or [employer]? []  No []  Yes   Fasting Blood Sugar Ranges-  Checks Blood Sugar _____ times a day  GLP1 agonist / usual dose - no GLP1 instructions:  SGLT-2 inhibitors / usual dose - no SGLT-2 instructions:   Blood Thinner / Instructions:no Aspirin Instructions:no  Comments:   Activity level: Patient is able to climb a flight of stairs without difficulty; [x]  No CP  [x]  No SOB,   Patient can  perform ADLs without assistance.   Anesthesia review: Mitral valve regurg., aortic atherosclerosis  Patient denies shortness of breath, fever, cough and chest pain at PAT appointment.  Patient verbalized understanding and agreement to the Pre-Surgical Instructions that were given to them at this PAT appointment. Patient was also educated of the need to review these PAT instructions again prior to his/her surgery.I reviewed the appropriate phone numbers to call if they have any and questions or concerns.

## 2023-07-16 NOTE — Patient Instructions (Signed)
SURGICAL WAITING ROOM VISITATION  Patients having surgery or a procedure may have no more than 2 support people in the waiting area - these visitors may rotate.    Children under the age of 76 must have an adult with them who is not the patient.  Due to an increase in RSV and influenza rates and associated hospitalizations, children ages 59 and under may not visit patients in [facility] hospitals.  If the patient needs to stay at the hospital during part of their recovery, the visitor guidelines for inpatient rooms apply. Pre-op nurse will coordinate an appropriate time for 1 support person to accompany patient in pre-op.  This support person may not rotate.    Please refer to the [facility] website for the visitor guidelines for Inpatients (after your surgery is over and you are in a regular room).       Your procedure is scheduled on: 07/26/23   Report to [facility] Main Entrance    Report to admitting at 9 AM   Call this number if you have problems the morning of surgery [phone]   Do not eat food :After Midnight.   After Midnight you may have the following liquids until 8:30 AM DAY OF SURGERY  Water Non-Citrus Juices (without pulp, NO RED-Apple, White grape, White cranberry) Black Coffee (NO MILK/CREAM OR CREAMERS, sugar ok)  Clear Tea (NO MILK/CREAM OR CREAMERS, sugar ok) regular and decaf                             Plain Jell-O (NO RED)                                           Fruit ices (not with fruit pulp, NO RED)                                     Popsicles (NO RED)                                                               Sports drinks like Gatorade (NO RED)                The day of surgery:  Drink ONE (1) Pre-Surgery Clear Ensure at 8:30  AM the morning of surgery. Drink in one sitting. Do not sip.  This drink was given to you during your hospital  pre-op appointment visit. Nothing else to drink after completing the  Pre-Surgery Clear Ensure     Oral Hygiene is also important to reduce your risk of infection.                                    Remember - BRUSH YOUR TEETH THE MORNING OF SURGERY WITH YOUR REGULAR TOOTHPASTE  DENTURES WILL BE REMOVED PRIOR TO SURGERY PLEASE DO NOT APPLY "Poly grip" OR ADHESIVES!!!   Stop all vitamins and herbal supplements 7 days before surgery.   Take these medicines the morning of  surgery with A SIP OF WATER: Ezetimibe, Omeprazole.             You may not have any metal on your body including hair pins, jewelry, and body piercing             Do not wear make-up, lotions, powders, cologne, or deodorant              Men may shave face and neck.   Do not bring valuables to the hospital. [facility] IS NOT             RESPONSIBLE   FOR VALUABLES.   Contacts, glasses, dentures or bridgework may not be worn into surgery.   Bring small overnight bag day of surgery.   DO NOT BRING YOUR HOME MEDICATIONS TO THE HOSPITAL. PHARMACY WILL DISPENSE MEDICATIONS LISTED ON YOUR MEDICATION LIST TO YOU DURING YOUR ADMISSION IN THE HOSPITAL!    Patients discharged on the day of surgery will not be allowed to drive home.  Someone NEEDS to stay with you for the first 24 hours after anesthesia.   Special Instructions: Bring a copy of your healthcare power of attorney and living will documents the day of surgery if you haven't scanned them before.              Please read over the following fact sheets you were given: IF YOU HAVE QUESTIONS ABOUT YOUR PRE-OP INSTRUCTIONS PLEASE CALL [phone] [facility]   If you received a COVID test during your pre-op visit  it is requested that you wear a mask when out in public, stay away from anyone that may not be feeling well and notify your surgeon if you develop symptoms. If you test positive for Covid or have been in contact with anyone that has tested positive in the last 10 days please notify you surgeon.      Pre-operative 5 CHG Bath Instructions   You can play a  key role in reducing the risk of infection after surgery. Your skin needs to be as free of germs as possible. You can reduce the number of germs on your skin by washing with CHG (chlorhexidine gluconate) soap before surgery. CHG is an antiseptic soap that kills germs and continues to kill germs even after washing.   DO NOT use if you have an allergy to chlorhexidine/CHG or antibacterial soaps. If your skin becomes reddened or irritated, stop using the CHG and notify one of our RNs at [phone].   Please shower with the CHG soap starting 4 days before surgery using the following schedule:     Please keep in mind the following:  DO NOT shave, including legs and underarms, starting the day of your first shower.   You may shave your face at any point before/day of surgery.  Place clean sheets on your bed the day you start using CHG soap. Use a clean washcloth (not used since being washed) for each shower. DO NOT sleep with pets once you start using the CHG.   CHG Shower Instructions:  If you choose to wash your hair and private area, wash first with your normal shampoo/soap.  After you use shampoo/soap, rinse your hair and body thoroughly to remove shampoo/soap residue.  Turn the water OFF and apply about 3 tablespoons (45 ml) of CHG soap to a CLEAN washcloth.  Apply CHG soap ONLY FROM YOUR NECK DOWN TO YOUR TOES (washing for 3-5 minutes)  DO NOT use CHG soap on face, private areas, open  wounds, or sores.  Pay special attention to the area where your surgery is being performed.  If you are having back surgery, having someone wash your back for you may be helpful. Wait 2 minutes after CHG soap is applied, then you may rinse off the CHG soap.  Pat dry with a clean towel  Put on clean clothes/pajamas   If you choose to wear lotion, please use ONLY the CHG-compatible lotions on the back of this paper.     Additional instructions for the day of surgery: DO NOT APPLY any lotions, deodorants,  cologne, or perfumes.   Put on clean/comfortable clothes.  Brush your teeth.  Ask your nurse before applying any prescription medications to the skin.      CHG Compatible Lotions   Aveeno Moisturizing lotion  Cetaphil Moisturizing Cream  Cetaphil Moisturizing Lotion  Clairol Herbal Essence Moisturizing Lotion, Dry Skin  Clairol Herbal Essence Moisturizing Lotion, Extra Dry Skin  Clairol Herbal Essence Moisturizing Lotion, Normal Skin  Curel Age Defying Therapeutic Moisturizing Lotion with Alpha Hydroxy  Curel Extreme Care Body Lotion  Curel Soothing Hands Moisturizing Hand Lotion  Curel Therapeutic Moisturizing Cream, Fragrance-Free  Curel Therapeutic Moisturizing Lotion, Fragrance-Free  Curel Therapeutic Moisturizing Lotion, Original Formula  Eucerin Daily Replenishing Lotion  Eucerin Dry Skin Therapy Plus Alpha Hydroxy Crme  Eucerin Dry Skin Therapy Plus Alpha Hydroxy Lotion  Eucerin Original Crme  Eucerin Original Lotion  Eucerin Plus Crme Eucerin Plus Lotion  Eucerin TriLipid Replenishing Lotion  Keri Anti-Bacterial Hand Lotion  Keri Deep Conditioning Original Lotion Dry Skin Formula Softly Scented  Keri Deep Conditioning Original Lotion, Fragrance Free Sensitive Skin Formula  Keri Lotion Fast Absorbing Fragrance Free Sensitive Skin Formula  Keri Lotion Fast Absorbing Softly Scented Dry Skin Formula  Keri Original Lotion  Keri Skin Renewal Lotion Keri Silky Smooth Lotion  Keri Silky Smooth Sensitive Skin Lotion  Nivea Body Creamy Conditioning Oil  Nivea Body Extra Enriched Lotion  Nivea Body Original Lotion  Nivea Body Sheer Moisturizing Lotion Nivea Crme  Nivea Skin Firming Lotion  NutraDerm 30 Skin Lotion  NutraDerm Skin Lotion  NutraDerm Therapeutic Skin Cream  NutraDerm Therapeutic Skin Lotion  ProShield Protective Hand Cream    Incentive Spirometer (Watch this video at home: [email or web address])  An incentive spirometer is a  tool that can help keep your lungs clear and active. This tool measures how well you are filling your lungs with each breath. Taking long deep breaths may help reverse or decrease the chance of developing breathing (pulmonary) problems (especially infection) following: A long period of time when you are unable to move or be active. BEFORE THE PROCEDURE  If the spirometer includes an indicator to show your best effort, your nurse or respiratory therapist will set it to a desired goal. If possible, sit up straight or lean slightly forward. Try not to slouch. Hold the incentive spirometer in an upright position. INSTRUCTIONS FOR USE  Sit on the edge of your bed if possible, or sit up as far as you can in bed or on a chair. Hold the incentive spirometer in an upright position. Breathe out normally. Place the mouthpiece in your mouth and seal your lips tightly around it. Breathe in slowly and as deeply as possible, raising the piston or the ball toward the top of the column. Hold your breath for 3-5 seconds or for as long as possible. Allow the piston or ball to fall to the bottom of the column. Remove the mouthpiece from your mouth  and breathe out normally. Rest for a few seconds and repeat Steps 1 through 7 at least 10 times every 1-2 hours when you are awake. Take your time and take a few normal breaths between deep breaths. The spirometer may include an indicator to show your best effort. Use the indicator as a goal to work toward during each repetition. After each set of 10 deep breaths, practice coughing to be sure your lungs are clear. If you have an incision (the cut made at the time of surgery), support your incision when coughing by placing a pillow or rolled up towels firmly against it. Once you are able to get out of bed, walk around indoors and cough well. You may stop using the incentive spirometer when instructed by your caregiver.  RISKS AND COMPLICATIONS Take your time so you do not  get dizzy or light-headed. If you are in pain, you may need to take or ask for pain medication before doing incentive spirometry. It is harder to take a deep breath if you are having pain. AFTER USE Rest and breathe slowly and easily. It can be helpful to keep track of a log of your progress. Your caregiver can provide you with a simple table to help with this. If you are using the spirometer at home, follow these instructions: SEEK MEDICAL CARE IF:  You are having difficultly using the spirometer. You have trouble using the spirometer as often as instructed. Your pain medication is not giving enough relief while using the spirometer. You develop fever of 100.5 F (38.1 C) or higher. SEEK IMMEDIATE MEDICAL CARE IF:  You cough up bloody sputum that had not been present before. You develop fever of 102 F (38.9 C) or greater. You develop worsening pain at or near the incision site. MAKE SURE YOU:  Understand these instructions. Will watch your condition. Will get help right away if you are not doing well or get worse. Document Released: 01/21/2007 Document Revised: 12/03/2011 Document Reviewed: 03/24/2007 [facility] Patient Information 2014 [place], [place].

## 2023-07-18 ENCOUNTER — Encounter (HOSPITAL_COMMUNITY): Payer: Self-pay

## 2023-07-18 ENCOUNTER — Other Ambulatory Visit: Payer: Self-pay

## 2023-07-18 ENCOUNTER — Encounter (HOSPITAL_COMMUNITY)
Admission: RE | Admit: 2023-07-18 | Discharge: 2023-07-18 | Disposition: A | Discharge status: Home / Self Care | Payer: Medicare Other | Source: Ambulatory Visit | Attending: Orthopaedic Surgery | Admitting: Orthopaedic Surgery

## 2023-07-18 VITALS — BP 127/67 | HR 55 | Resp 16 | Ht 68.0 in | Wt 145.0 lb

## 2023-07-18 DIAGNOSIS — Z01812 Encounter for preprocedural laboratory examination: Secondary | ICD-10-CM | POA: Diagnosis present

## 2023-07-18 DIAGNOSIS — Z01810 Encounter for preprocedural cardiovascular examination: Secondary | ICD-10-CM | POA: Diagnosis present

## 2023-07-18 DIAGNOSIS — M1711 Unilateral primary osteoarthritis, right knee: Secondary | ICD-10-CM | POA: Insufficient documentation

## 2023-07-18 DIAGNOSIS — Z01818 Encounter for other preprocedural examination: Secondary | ICD-10-CM | POA: Diagnosis not present

## 2023-07-18 DIAGNOSIS — I340 Nonrheumatic mitral (valve) insufficiency: Secondary | ICD-10-CM

## 2023-07-18 HISTORY — DX: Personal history of urinary calculi: Z87.442

## 2023-07-18 LAB — CBC
HCT: 42 % (ref 39.0–52.0)
Hemoglobin: 14 g/dL (ref 13.0–17.0)
MCH: 33.7 pg (ref 26.0–34.0)
MCHC: 33.3 g/dL (ref 30.0–36.0)
MCV: 101.2 fL — ABNORMAL HIGH (ref 80.0–100.0)
Platelets: 210 10*3/uL (ref 150–400)
RBC: 4.15 MIL/uL — ABNORMAL LOW (ref 4.22–5.81)
RDW: 12.9 % (ref 11.5–15.5)
WBC: 5.2 10*3/uL (ref 4.0–10.5)
nRBC: 0 % (ref 0.0–0.2)

## 2023-07-18 LAB — COMPREHENSIVE METABOLIC PANEL
ALT: 19 U/L (ref 0–44)
AST: 21 U/L (ref 15–41)
Albumin: 3.7 g/dL (ref 3.5–5.0)
Alkaline Phosphatase: 81 U/L (ref 38–126)
Anion gap: 7 (ref 5–15)
BUN: 22 mg/dL (ref 8–23)
CO2: 26 mmol/L (ref 22–32)
Calcium: 9.2 mg/dL (ref 8.9–10.3)
Chloride: 105 mmol/L (ref 98–111)
Creatinine, Ser: 0.76 mg/dL (ref 0.61–1.24)
GFR, Estimated: >60 mL/min (ref >60)
Glucose, Bld: 102 mg/dL — ABNORMAL HIGH (ref 70–99)
Potassium: 4.6 mmol/L (ref 3.5–5.1)
Sodium: 138 mmol/L (ref 135–145)
Total Bilirubin: 0.5 mg/dL (ref 0.3–1.2)
Total Protein: 6.3 g/dL — ABNORMAL LOW (ref 6.5–8.1)

## 2023-07-18 LAB — SURGICAL PCR SCREEN
MRSA, PCR: NEGATIVE
Staphylococcus aureus: NEGATIVE

## 2023-07-25 ENCOUNTER — Telehealth: Payer: Self-pay | Admitting: *Deleted

## 2023-07-25 NOTE — Telephone Encounter (Signed)
Ortho bundle call completed. 

## 2023-07-25 NOTE — H&P (Signed)
TOTAL KNEE ADMISSION H&P  Patient is being admitted for right total knee arthroplasty.  Subjective:  Chief Complaint:right knee pain.  HPI: Brandon Acosta, 82 y.o. male, has a history of pain and functional disability in the right knee due to arthritis and has failed non-surgical conservative treatments for greater than 12 weeks to includeNSAID's and/or analgesics, corticosteriod injections, viscosupplementation injections, flexibility and strengthening excercises, and activity modification.  Onset of symptoms was gradual, starting 5 years ago with gradually worsening course since that time. The patient noted prior procedures on the knee to include  arthroscopy on the right knee(s).  Patient currently rates pain in the right knee(s) at 10 out of 10 with activity. Patient has night pain, worsening of pain with activity and weight bearing, pain that interferes with activities of daily living, pain with passive range of motion, crepitus, and joint swelling.  Patient has evidence of subchondral sclerosis, periarticular osteophytes, and joint space narrowing by imaging studies. There is no active infection.  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   Hematuria 09/04/2018   BPH (benign prostatic hyperplasia) 09/04/2018   Sepsis secondary to UTI (HCC) 09/04/2018   UTI (urinary tract infection) 09/04/2018   Abnormal ECG during exercise stress test    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HCC)    Arthritis    Diverticulitis    GERD (gastroesophageal reflux disease)    History of kidney stones    Hyperlipemia    Hyperlipidemia    Osteoporosis    Palpitations     Past Surgical History:   Procedure Laterality Date   APPENDECTOMY  as child   CARDIAC CATHETERIZATION N/A 10/16/2016   Procedure: Left Heart Cath and Coronary Angiography;  Surgeon: Corky Crafts, MD;  Location: [facility] INVASIVE CV LAB;  Service: Cardiovascular;  Laterality: N/A;   COLONOSCOPY WITH PROPOFOL N/A 02/11/2014   Procedure: COLONOSCOPY WITH PROPOFOL;  Surgeon: Florencia Reasons, MD;  Location: [facility] ENDOSCOPY;  Service: Endoscopy;  Laterality: N/A;   HERNIA REPAIR  2024   KNEE ARTHROSCOPY Right 09/25/2011   torn meniscus repair   PROSTATE SURGERY     TONSILLECTOMY      No current facility-administered medications for this encounter.   Current Outpatient Medications  Medication Sig Dispense Refill Last Dose   ezetimibe (ZETIA) 10 MG tablet Take 1 tablet (10 mg total) by mouth daily. 90 tablet 3    ibuprofen (ADVIL,MOTRIN) 200 MG tablet Take 400 mg by mouth daily as needed for fever or moderate pain.      omeprazole (PRILOSEC) 20 MG capsule Take 20 mg by mouth daily.      oxymetazoline (AFRIN) 0.05 % nasal spray Place 1 spray into both nostrils at bedtime.      Allergies  Allergen Reactions   Simvastatin Other (See Comments)    Leg cramps    Social History   Tobacco Use   Smoking status: Former    Current packs/day: 0.00    Average packs/day: 0.5 packs/day for 20.0 years (10.0 ttl pk-yrs)    Types: Cigarettes    Start date: 09/24/1956    Quit date: 09/24/1976    Years since quitting: 46.8   Smokeless tobacco: Never  Substance Use Topics  Alcohol use: Yes    Comment: occassionally 1 glass wine per day    Family History  Problem Relation Age of Onset   Alcoholism Mother    Breast cancer Mother    Heart disease Mother    Cancer - Lung Father    Breast cancer Sister    Cancer - Ovarian Sister    Breast cancer Sister    Tremor Sister      Review of Systems  Objective:  Physical Exam Vitals reviewed.  Constitutional:      Appearance: Normal appearance. He is normal weight.  HENT:      Head: Normocephalic and atraumatic.  Eyes:     Extraocular Movements: Extraocular movements intact.     Pupils: Pupils are equal, round, and reactive to light.  Cardiovascular:     Rate and Rhythm: Normal rate.     Pulses: Normal pulses.  Pulmonary:     Effort: Pulmonary effort is normal.     Breath sounds: Normal breath sounds.  Abdominal:     Palpations: Abdomen is soft.  Musculoskeletal:     Cervical back: Normal range of motion and neck supple.     Right knee: Effusion, bony tenderness and crepitus present. [name]. Tenderness present over the medial joint line and lateral joint line. Abnormal alignment and abnormal meniscus.  Neurological:     Mental Status: He is alert and oriented to person, place, and time.  Psychiatric:        Behavior: Behavior normal.     Vital signs in last 24 hours:    Labs:   Estimated body mass index is 22.05 kg/m as calculated from the following:   Height as of 07/18/23: 5\' 8"  (1.727 m).   Weight as of 07/18/23: 65.8 kg.   Imaging Review Plain radiographs demonstrate severe degenerative joint disease of the right knee(s). The overall alignment ismild varus. The bone quality appears to be excellent for age and reported activity level.      Assessment/Plan:  End stage arthritis, right knee   The patient history, physical examination, clinical judgment of the provider and imaging studies are consistent with end stage degenerative joint disease of the right knee(s) and total knee arthroplasty is deemed medically necessary. The treatment options including medical management, injection therapy arthroscopy and arthroplasty were discussed at length. The risks and benefits of total knee arthroplasty were presented and reviewed. The risks due to aseptic loosening, infection, stiffness, patella tracking problems, thromboembolic complications and other imponderables were discussed. The patient acknowledged the explanation,  agreed to proceed with the plan and consent was signed. Patient is being admitted for inpatient treatment for surgery, pain control, PT, OT, prophylactic antibiotics, VTE prophylaxis, progressive ambulation and ADL's and discharge planning. The patient is planning to be discharged home with home health services

## 2023-07-25 NOTE — Care Plan (Signed)
[facility] RNCM call to patient to discuss his upcoming Right total knee arthroplasty with Dr. Magnus Ivan at [facility] on 07/26/23. He is agreeable to case management. He lives with his spouse at [facility]. He plans to return to his independent living apartment there with assistance from his spouse after surgery. He will need a RW before discharge. CM has contacted [employer] who contracts for therapy there at [facility] and provided orders for post op therapy. They will provide therapy in home at first, then OPPT after a few visits. Reviewed all post op care instructions with patient and questions answered. Will continue to follow for needs.

## 2023-07-26 ENCOUNTER — Ambulatory Visit (HOSPITAL_COMMUNITY): Payer: Medicare Other | Admitting: Physician Assistant

## 2023-07-26 ENCOUNTER — Ambulatory Visit (HOSPITAL_COMMUNITY): Payer: Medicare Other | Admitting: Anesthesiology

## 2023-07-26 ENCOUNTER — Encounter (HOSPITAL_COMMUNITY): Payer: Self-pay | Admitting: Orthopaedic Surgery

## 2023-07-26 ENCOUNTER — Other Ambulatory Visit: Payer: Self-pay

## 2023-07-26 ENCOUNTER — Observation Stay (HOSPITAL_COMMUNITY): Payer: Medicare Other

## 2023-07-26 ENCOUNTER — Observation Stay (HOSPITAL_COMMUNITY)
Admission: RE | Admit: 2023-07-26 | Discharge: 2023-07-27 | Disposition: A | Discharge status: Home / Self Care | Payer: Medicare Other | Attending: Orthopaedic Surgery | Admitting: Orthopaedic Surgery

## 2023-07-26 ENCOUNTER — Encounter (HOSPITAL_COMMUNITY)
Admission: RE | Disposition: A | Discharge status: Home / Self Care | Payer: Self-pay | Source: Home / Self Care | Attending: Orthopaedic Surgery

## 2023-07-26 DIAGNOSIS — Z87891 Personal history of nicotine dependence: Secondary | ICD-10-CM | POA: Insufficient documentation

## 2023-07-26 DIAGNOSIS — Z96651 Presence of right artificial knee joint: Secondary | ICD-10-CM

## 2023-07-26 DIAGNOSIS — M1711 Unilateral primary osteoarthritis, right knee: Secondary | ICD-10-CM | POA: Diagnosis not present

## 2023-07-26 DIAGNOSIS — G8918 Other acute postprocedural pain: Secondary | ICD-10-CM | POA: Diagnosis not present

## 2023-07-26 DIAGNOSIS — I340 Nonrheumatic mitral (valve) insufficiency: Secondary | ICD-10-CM

## 2023-07-26 DIAGNOSIS — E7800 Pure hypercholesterolemia, unspecified: Secondary | ICD-10-CM | POA: Diagnosis not present

## 2023-07-26 DIAGNOSIS — Z01818 Encounter for other preprocedural examination: Secondary | ICD-10-CM

## 2023-07-26 DIAGNOSIS — Z471 Aftercare following joint replacement surgery: Secondary | ICD-10-CM | POA: Diagnosis not present

## 2023-07-26 HISTORY — PX: TOTAL KNEE ARTHROPLASTY: SHX125

## 2023-07-26 SURGERY — ARTHROPLASTY, KNEE, TOTAL
Anesthesia: Spinal | Site: Knee | Laterality: Right

## 2023-07-26 MED ORDER — ONDANSETRON HCL 4 MG/2ML IJ SOLN: 4.0000 mg | Freq: Four times a day (QID) | INTRAMUSCULAR | Status: DC | PRN

## 2023-07-26 MED ORDER — METOCLOPRAMIDE HCL 5 MG/ML IJ SOLN: 5.0000 mg | Freq: Three times a day (TID) | INTRAMUSCULAR | Status: DC | PRN

## 2023-07-26 MED ORDER — ALBUMIN HUMAN 5 % IV SOLN: INTRAVENOUS | Status: DC | PRN

## 2023-07-26 MED ORDER — FENTANYL CITRATE (PF) 100 MCG/2ML IJ SOLN
INTRAMUSCULAR | Status: AC
  Filled 2023-07-26: qty 2

## 2023-07-26 MED ORDER — HYDROMORPHONE HCL 1 MG/ML IJ SOLN
0.2500 mg | INTRAMUSCULAR | Status: DC | PRN
Start: 2023-07-26 — End: 2023-07-26

## 2023-07-26 MED ORDER — ACETAMINOPHEN 10 MG/ML IV SOLN: 1000.0000 mg | Freq: Once | INTRAVENOUS | Status: DC | PRN

## 2023-07-26 MED ORDER — LIDOCAINE HCL (CARDIAC) PF 100 MG/5ML IV SOSY
PREFILLED_SYRINGE | INTRAVENOUS | Status: DC | PRN
  Administered 2023-07-26: 20 mg via INTRAVENOUS

## 2023-07-26 MED ORDER — LIDOCAINE HCL (PF) 2 % IJ SOLN
INTRAMUSCULAR | Status: AC
  Filled 2023-07-26: qty 5

## 2023-07-26 MED ORDER — CHLORHEXIDINE GLUCONATE 0.12 % MT SOLN
15.0000 mL | Freq: Once | OROMUCOSAL | Status: AC
  Administered 2023-07-26: 15 mL via OROMUCOSAL

## 2023-07-26 MED ORDER — ACETAMINOPHEN 325 MG PO TABS: 325.0000 mg | ORAL_TABLET | Freq: Four times a day (QID) | ORAL | Status: DC | PRN

## 2023-07-26 MED ORDER — ALUM & MAG HYDROXIDE-SIMETH 200-200-20 MG/5ML PO SUSP: 30.0000 mL | ORAL | Status: DC | PRN

## 2023-07-26 MED ORDER — ACETAMINOPHEN 160 MG/5ML PO SOLN
325.0000 mg | Freq: Once | ORAL | Status: DC | PRN
Start: 2023-07-26 — End: 2023-07-26

## 2023-07-26 MED ORDER — DIPHENHYDRAMINE HCL 12.5 MG/5ML PO ELIX: 12.5000 mg | ORAL_SOLUTION | ORAL | Status: DC | PRN

## 2023-07-26 MED ORDER — CEFAZOLIN SODIUM-DEXTROSE 1-4 GM/50ML-% IV SOLN
1.0000 g | Freq: Four times a day (QID) | INTRAVENOUS | Status: AC
  Administered 2023-07-26 (×2): 1 g via INTRAVENOUS
  Filled 2023-07-26 (×2): qty 50

## 2023-07-26 MED ORDER — ROPIVACAINE HCL 5 MG/ML IJ SOLN
INTRAMUSCULAR | Status: DC | PRN
  Administered 2023-07-26: 30 mL via PERINEURAL

## 2023-07-26 MED ORDER — LACTATED RINGERS IV SOLN: INTRAVENOUS | Status: DC

## 2023-07-26 MED ORDER — BUPIVACAINE-EPINEPHRINE 0.25% -1:200000 IJ SOLN
INTRAMUSCULAR | Status: AC
  Filled 2023-07-26: qty 1

## 2023-07-26 MED ORDER — MIDAZOLAM HCL 2 MG/2ML IJ SOLN
2.0000 mg | Freq: Once | INTRAMUSCULAR | Status: AC
  Administered 2023-07-26: 1 mg via INTRAVENOUS
  Filled 2023-07-26: qty 2

## 2023-07-26 MED ORDER — BUPIVACAINE-EPINEPHRINE 0.25% -1:200000 IJ SOLN
INTRAMUSCULAR | Status: DC | PRN
  Administered 2023-07-26: 30 mL

## 2023-07-26 MED ORDER — PHENYLEPHRINE HCL-NACL 20-0.9 MG/250ML-% IV SOLN
INTRAVENOUS | Status: DC | PRN
  Administered 2023-07-26: 30 ug/min via INTRAVENOUS

## 2023-07-26 MED ORDER — DROPERIDOL 2.5 MG/ML IJ SOLN: 0.6250 mg | Freq: Once | INTRAMUSCULAR | Status: DC | PRN

## 2023-07-26 MED ORDER — CEFAZOLIN SODIUM-DEXTROSE 2-4 GM/100ML-% IV SOLN
2.0000 g | INTRAVENOUS | Status: AC
  Administered 2023-07-26: 2 g via INTRAVENOUS
  Filled 2023-07-26: qty 100

## 2023-07-26 MED ORDER — FENTANYL CITRATE (PF) 100 MCG/2ML IJ SOLN
INTRAMUSCULAR | Status: DC | PRN
  Administered 2023-07-26 (×2): 25 ug via INTRAVENOUS
  Administered 2023-07-26: 50 ug via INTRAVENOUS

## 2023-07-26 MED ORDER — SODIUM CHLORIDE 0.9% FLUSH
10.0000 mL | Freq: Two times a day (BID) | INTRAVENOUS | Status: DC
  Administered 2023-07-27: 10 mL via INTRAVENOUS

## 2023-07-26 MED ORDER — ASPIRIN 81 MG PO CHEW
81.0000 mg | CHEWABLE_TABLET | Freq: Two times a day (BID) | ORAL | Status: DC
  Administered 2023-07-26 – 2023-07-27 (×2): 81 mg via ORAL
  Filled 2023-07-26 (×2): qty 1

## 2023-07-26 MED ORDER — OXYCODONE HCL 5 MG PO TABS: 10.0000 mg | ORAL_TABLET | ORAL | Status: DC | PRN

## 2023-07-26 MED ORDER — DOCUSATE SODIUM 100 MG PO CAPS
100.0000 mg | ORAL_CAPSULE | Freq: Two times a day (BID) | ORAL | Status: DC
  Administered 2023-07-26 – 2023-07-27 (×2): 100 mg via ORAL
  Filled 2023-07-26 (×2): qty 1

## 2023-07-26 MED ORDER — BUPIVACAINE IN DEXTROSE 0.75-8.25 % IT SOLN
INTRATHECAL | Status: DC | PRN
  Administered 2023-07-26: 1.6 mL via INTRATHECAL

## 2023-07-26 MED ORDER — PROPOFOL 500 MG/50ML IV EMUL
INTRAVENOUS | Status: DC | PRN
  Administered 2023-07-26: 50 ug/kg/min via INTRAVENOUS

## 2023-07-26 MED ORDER — ALBUMIN HUMAN 5 % IV SOLN
INTRAVENOUS | Status: AC
  Filled 2023-07-26: qty 250

## 2023-07-26 MED ORDER — 0.9 % SODIUM CHLORIDE (POUR BTL) OPTIME
TOPICAL | Status: DC | PRN
  Administered 2023-07-26: 1000 mL

## 2023-07-26 MED ORDER — GLYCOPYRROLATE 0.2 MG/ML IJ SOLN
INTRAMUSCULAR | Status: DC | PRN
  Administered 2023-07-26: .1 mg via INTRAVENOUS

## 2023-07-26 MED ORDER — METOCLOPRAMIDE HCL 5 MG PO TABS: 5.0000 mg | ORAL_TABLET | Freq: Three times a day (TID) | ORAL | Status: DC | PRN

## 2023-07-26 MED ORDER — ACETAMINOPHEN 325 MG PO TABS
325.0000 mg | ORAL_TABLET | Freq: Once | ORAL | Status: DC | PRN
Start: 2023-07-26 — End: 2023-07-26

## 2023-07-26 MED ORDER — HYDROMORPHONE HCL 1 MG/ML IJ SOLN: 0.5000 mg | INTRAMUSCULAR | Status: DC | PRN

## 2023-07-26 MED ORDER — ONDANSETRON HCL 4 MG/2ML IJ SOLN
INTRAMUSCULAR | Status: DC | PRN
  Administered 2023-07-26: 4 mg via INTRAVENOUS

## 2023-07-26 MED ORDER — ONDANSETRON HCL 4 MG PO TABS: 4.0000 mg | ORAL_TABLET | Freq: Four times a day (QID) | ORAL | Status: DC | PRN

## 2023-07-26 MED ORDER — ONDANSETRON HCL 4 MG/2ML IJ SOLN
INTRAMUSCULAR | Status: AC
  Filled 2023-07-26: qty 2

## 2023-07-26 MED ORDER — OXYCODONE HCL 5 MG PO TABS
5.0000 mg | ORAL_TABLET | ORAL | Status: DC | PRN
  Administered 2023-07-26: 10 mg via ORAL
  Administered 2023-07-26: 5 mg via ORAL
  Administered 2023-07-27: 10 mg via ORAL
  Administered 2023-07-27: 5 mg via ORAL
  Filled 2023-07-26 (×2): qty 2
  Filled 2023-07-26: qty 1
  Filled 2023-07-26: qty 2

## 2023-07-26 MED ORDER — ORAL CARE MOUTH RINSE: 15.0000 mL | Freq: Once | OROMUCOSAL | Status: AC

## 2023-07-26 MED ORDER — PHENYLEPHRINE HCL-NACL 20-0.9 MG/250ML-% IV SOLN
INTRAVENOUS | Status: AC
  Filled 2023-07-26: qty 250

## 2023-07-26 MED ORDER — PANTOPRAZOLE SODIUM 40 MG PO TBEC
40.0000 mg | DELAYED_RELEASE_TABLET | Freq: Every day | ORAL | Status: DC
  Administered 2023-07-26 – 2023-07-27 (×2): 40 mg via ORAL
  Filled 2023-07-26 (×2): qty 1

## 2023-07-26 MED ORDER — TRANEXAMIC ACID-NACL 1000-0.7 MG/100ML-% IV SOLN
1000.0000 mg | INTRAVENOUS | Status: AC
  Administered 2023-07-26: 1000 mg via INTRAVENOUS
  Filled 2023-07-26: qty 100

## 2023-07-26 MED ORDER — PHENOL 1.4 % MT LIQD: 1.0000 | OROMUCOSAL | Status: DC | PRN

## 2023-07-26 MED ORDER — PROPOFOL 1000 MG/100ML IV EMUL
INTRAVENOUS | Status: AC
  Filled 2023-07-26: qty 100

## 2023-07-26 MED ORDER — EZETIMIBE 10 MG PO TABS
10.0000 mg | ORAL_TABLET | Freq: Every day | ORAL | Status: DC
  Administered 2023-07-27: 10 mg via ORAL
  Filled 2023-07-26: qty 1

## 2023-07-26 MED ORDER — METHOCARBAMOL 1000 MG/10ML IJ SOLN: 500.0000 mg | Freq: Four times a day (QID) | INTRAMUSCULAR | Status: DC | PRN

## 2023-07-26 MED ORDER — POVIDONE-IODINE 10 % EX SWAB: 2.0000 | Freq: Once | CUTANEOUS | Status: DC

## 2023-07-26 MED ORDER — MEPERIDINE HCL 50 MG/ML IJ SOLN
6.2500 mg | INTRAMUSCULAR | Status: DC | PRN
Start: 2023-07-26 — End: 2023-07-26

## 2023-07-26 MED ORDER — GLYCOPYRROLATE 0.2 MG/ML IJ SOLN
INTRAMUSCULAR | Status: AC
  Filled 2023-07-26: qty 1

## 2023-07-26 MED ORDER — PROPOFOL 10 MG/ML IV BOLUS
INTRAVENOUS | Status: DC | PRN
  Administered 2023-07-26 (×2): 10 mg via INTRAVENOUS

## 2023-07-26 MED ORDER — MENTHOL 3 MG MT LOZG: 1.0000 | LOZENGE | OROMUCOSAL | Status: DC | PRN

## 2023-07-26 MED ORDER — METHOCARBAMOL 500 MG PO TABS
500.0000 mg | ORAL_TABLET | Freq: Four times a day (QID) | ORAL | Status: DC | PRN
  Administered 2023-07-26 – 2023-07-27 (×3): 500 mg via ORAL
  Filled 2023-07-26 (×3): qty 1

## 2023-07-26 MED ORDER — FENTANYL CITRATE PF 50 MCG/ML IJ SOSY
100.0000 ug | PREFILLED_SYRINGE | Freq: Once | INTRAMUSCULAR | Status: AC
  Administered 2023-07-26: 25 ug via INTRAVENOUS
  Filled 2023-07-26: qty 2

## 2023-07-26 SURGICAL SUPPLY — 60 items
APL SKNCLS STERI-STRIP NONHPOA (GAUZE/BANDAGES/DRESSINGS)
BAG COUNTER SPONGE SURGICOUNT (BAG) IMPLANT
BAG SPEC THK2 15X12 ZIP CLS (MISCELLANEOUS) ×1
BAG SPNG CNTER NS LX DISP (BAG)
BAG ZIPLOCK 12X15 (MISCELLANEOUS) ×1 IMPLANT
BENZOIN TINCTURE PRP APPL 2/3 (GAUZE/BANDAGES/DRESSINGS) IMPLANT
BLADE SAG 18X100X1.27 (BLADE) ×1 IMPLANT
BLADE SURG SZ10 CARB STEEL (BLADE) ×2 IMPLANT
BNDG CMPR 6 X 5 YARDS HK CLSR (GAUZE/BANDAGES/DRESSINGS) ×1
BNDG ELASTIC 6INX 5YD STR LF (GAUZE/BANDAGES/DRESSINGS) ×2 IMPLANT
BOWL SMART MIX CTS (DISPOSABLE) IMPLANT
CEMENT BONE R 1X40 (Cement) IMPLANT
COMP FEM CMT PERSONA SZ9 RT (Joint) ×1 IMPLANT
COMP TIB CMT PS KNEE F 0D RT (Joint) ×1 IMPLANT
COMPONENT FEM CMT PRSONA SZ9RT (Joint) IMPLANT
COMPONENT TIB CMT PS KN F 0DRT (Joint) IMPLANT
COOLER ICEMAN CLASSIC (MISCELLANEOUS) ×1 IMPLANT
COVER SURGICAL LIGHT HANDLE (MISCELLANEOUS) ×1 IMPLANT
CUFF TOURN SGL QUICK 34 (TOURNIQUET CUFF) ×1
CUFF TRNQT CYL 34X4.125X (TOURNIQUET CUFF) ×1 IMPLANT
DRAPE INCISE IOBAN 66X45 STRL (DRAPES) ×1 IMPLANT
DRAPE U-SHAPE 47X51 STRL (DRAPES) ×1 IMPLANT
DURAPREP 26ML APPLICATOR (WOUND CARE) ×1 IMPLANT
ELECT BLADE TIP CTD 4 INCH (ELECTRODE) ×1 IMPLANT
ELECT REM PT RETURN 15FT ADLT (MISCELLANEOUS) ×1 IMPLANT
GAUZE PAD ABD 8X10 STRL (GAUZE/BANDAGES/DRESSINGS) ×2 IMPLANT
GAUZE SPONGE 4X4 12PLY STRL (GAUZE/BANDAGES/DRESSINGS) ×1 IMPLANT
GAUZE XEROFORM 1X8 LF (GAUZE/BANDAGES/DRESSINGS) IMPLANT
GLOVE BIO SURGEON STRL SZ7.5 (GLOVE) ×1 IMPLANT
GLOVE BIOGEL PI IND STRL 8 (GLOVE) ×2 IMPLANT
GLOVE ECLIPSE 8.0 STRL XLNG CF (GLOVE) ×1 IMPLANT
GOWN STRL REUS W/ TWL XL LVL3 (GOWN DISPOSABLE) ×2 IMPLANT
GOWN STRL REUS W/TWL XL LVL3 (GOWN DISPOSABLE) ×2
HANDPIECE INTERPULSE COAX TIP (DISPOSABLE) ×1
HOLDER FOLEY CATH W/STRAP (MISCELLANEOUS) IMPLANT
IMMOBILIZER KNEE 20 (SOFTGOODS) ×1
IMMOBILIZER KNEE 20 THIGH 36 (SOFTGOODS) ×1 IMPLANT
INSERT TIB ARTISURF SZ8-11X12 (Insert) IMPLANT
KIT TURNOVER KIT A (KITS) IMPLANT
NS IRRIG 1000ML POUR BTL (IV SOLUTION) ×1 IMPLANT
PACK TOTAL KNEE CUSTOM (KITS) ×1 IMPLANT
PAD COLD SHLDR WRAP-ON (PAD) ×1 IMPLANT
PADDING CAST COTTON 6X4 STRL (CAST SUPPLIES) ×2 IMPLANT
PIN DRILL HDLS TROCAR 75 4PK (PIN) IMPLANT
PROTECTOR NERVE ULNAR (MISCELLANEOUS) ×1 IMPLANT
SCREW FEMALE HEX FIX 25X2.5 (ORTHOPEDIC DISPOSABLE SUPPLIES) IMPLANT
SET HNDPC FAN SPRY TIP SCT (DISPOSABLE) ×1 IMPLANT
SET PAD KNEE POSITIONER (MISCELLANEOUS) ×1 IMPLANT
SPIKE FLUID TRANSFER (MISCELLANEOUS) IMPLANT
STAPLER SKIN PROX WIDE 3.9 (STAPLE) IMPLANT
STEM POLY PAT PLY 32M KNEE (Knees) IMPLANT
STRIP CLOSURE SKIN 1/2X4 (GAUZE/BANDAGES/DRESSINGS) IMPLANT
SUT MNCRL AB 4-0 PS2 18 (SUTURE) IMPLANT
SUT VIC AB 0 CT1 27 (SUTURE) ×1
SUT VIC AB 0 CT1 27XBRD ANTBC (SUTURE) ×1 IMPLANT
SUT VIC AB 1 CT1 36 (SUTURE) ×2 IMPLANT
SUT VIC AB 2-0 CT1 27 (SUTURE) ×2
SUT VIC AB 2-0 CT1 TAPERPNT 27 (SUTURE) ×2 IMPLANT
TRAY FOLEY MTR SLVR 16FR STAT (SET/KITS/TRAYS/PACK) IMPLANT
WATER STERILE IRR 1000ML POUR (IV SOLUTION) ×2 IMPLANT

## 2023-07-26 NOTE — Anesthesia Procedure Notes (Signed)
Anesthesia Regional Block: Adductor canal block   Pre-Anesthetic Checklist: , timeout performed,  Correct Patient, Correct Site, Correct Laterality,  Correct Procedure, Correct Position, site marked,  Risks and benefits discussed,  Surgical consent,  Pre-op evaluation,  At surgeon's request and post-op pain management  Laterality: Right  Prep: chloraprep       Needles:  Injection technique: Single-shot  Needle Type: Echogenic Stimulator Needle     Needle Length: 9cm  Needle Gauge: 21     Additional Needles:   Procedures:,,,, ultrasound used (permanent image in chart),,    Narrative:  Start time: 07/26/2023 10:25 AM End time: 07/26/2023 10:30 AM Injection made incrementally with aspirations every 5 mL.  Performed by: Personally  Anesthesiologist: Shelton Silvas, MD  Additional Notes: Discussed risks and benefits of the nerve block in detail, including but not limited vascular injury, permanent nerve damage and infection.   Patient tolerated the procedure well. Local anesthetic introduced in an incremental fashion under minimal resistance after negative aspirations. No paresthesias were elicited. After completion of the procedure, no acute issues were identified and patient continued to be monitored by RN.

## 2023-07-26 NOTE — Anesthesia Procedure Notes (Signed)
Spinal  Patient location during procedure: OR Start time: 07/26/2023 11:49 AM End time: 07/26/2023 11:53 AM Reason for block: surgical anesthesia Staffing Performed: resident/CRNA  Resident/CRNA: Garth Bigness, CRNA Performed by: Garth Bigness, CRNA Authorized by: Shelton Silvas, MD   Preanesthetic Checklist Completed: patient identified, IV checked, site marked, risks and benefits discussed, surgical consent, monitors and equipment checked, pre-op evaluation and timeout performed Spinal Block Patient position: sitting Prep: DuraPrep Patient monitoring: heart rate, continuous pulse ox and blood pressure Location: L4-5 Injection technique: single-shot Needle Needle type: Pencan  Needle gauge: 24 G Needle length: 9 cm Needle insertion depth: 5 cm Assessment Sensory level: T10 Events: CSF return Additional Notes

## 2023-07-26 NOTE — Evaluation (Signed)
Physical Therapy Evaluation Patient Details Name: Brandon Acosta MRN: [id number] DOB: [birth year] Today's Date: 07/26/2023  History of Present Illness  82 yo male presents to therapy s/p R TKA on 07/26/2023 due to failure of conservative measures. Pt PMH includes but is not limited to: HLD, recurrent UTI, mitral valve regurgitation, Atrial tachycardia, kidney stones, angina, diverticulitis, GERD, and HTN.  Clinical Impression      Brandon Acosta is a 82 y.o. male POD 0 s/p R TKA. Patient reports IND with mobility at baseline. Patient is now limited by functional impairments (see PT problem list below) and requires S for bed mobility and CGA and cues for transfers. Patient was able to ambulate 45 feet with RW and CGA level of assist. Patient instructed in exercise to facilitate ROM and circulation to manage edema. Patient will benefit from continued skilled PT interventions to address impairments and progress towards PLOF. Acute PT will follow to progress mobility and stair training in preparation for safe discharge home with family support and Brandon Acosta services.     If plan is discharge home, recommend the following: A little help with walking and/or transfers;A little help with bathing/dressing/bathroom;Assistance with cooking/housework;Assist for transportation;Help with stairs or ramp for entrance   Can travel by private vehicle        Equipment Recommendations Rolling walker (2 wheels)  Recommendations for Other Services       Functional Status Assessment Patient has had a recent decline in their functional status and demonstrates the ability to make significant improvements in function in a reasonable and predictable amount of time.     Precautions / Restrictions Precautions Precautions: Knee;Fall Restrictions Weight Bearing Restrictions: No      Mobility  Bed Mobility Overal bed mobility: Needs Assistance Bed Mobility: Supine to Sit     Supine to sit: Supervision, HOB  elevated, Used rails     General bed mobility comments: min cues    Transfers Overall transfer level: Needs assistance Equipment used: Rolling walker (2 wheels) Transfers: Sit to/from Stand Sit to Stand: Contact guard assist           General transfer comment: min cues    Ambulation/Gait Ambulation/Gait assistance: Contact guard assist Gait Distance (Feet): 45 Feet Assistive device: Rolling walker (2 wheels) Gait Pattern/deviations: Step-to pattern, Antalgic Gait velocity: decreased     General Gait Details: min cues for posture and proper distance from RW. pt demonstrated good sequencing, slight R knee flexion throughout gait cycle no instability noted  Stairs            Wheelchair Mobility     Tilt Bed    Modified Rankin (Stroke Patients Only)       Balance Overall balance assessment: Needs assistance Sitting-balance support: Feet supported Sitting balance-Leahy Scale: Good     Standing balance support: Bilateral upper extremity supported, During functional activity, Reliant on assistive device for balance Standing balance-Leahy Scale: Poor                               Pertinent Vitals/Pain Pain Assessment Pain Assessment: 0-10 Pain Score: 7  Pain Location: R knee Pain Descriptors / Indicators: Aching, Constant, Discomfort, Grimacing, Operative site guarding Pain Intervention(s): Limited activity within patient's tolerance, Monitored during session, Premedicated before session, Repositioned, Ice applied    Home Living Family/patient expects to be discharged to:: Private residence Living Arrangements: Spouse/significant other Available Help at Discharge: Family Type of Home: Independent living facility Home Access:  Level entry       Home Layout: One level Home Equipment: None Additional Comments: pt reports second story apartment at [facility]    Prior Function Prior Level of Function : Independent/Modified  Independent;Driving             Mobility Comments: IND no AD for all ADLs, self care tasks and IADLs       Extremity/Trunk Assessment        Lower Extremity Assessment Lower Extremity Assessment: RLE deficits/detail RLE Deficits / Details: ankle DF/PF 5/5; SLR < 10 degree lag RLE Sensation: WNL    Cervical / Trunk Assessment Cervical / Trunk Assessment: Normal  Communication   Communication Communication: No apparent difficulties  Cognition Arousal: Alert Behavior During Therapy: WFL for tasks assessed/performed Overall Cognitive Status: Within Functional Limits for tasks assessed                                          General Comments      Exercises Total Joint Exercises Ankle Circles/Pumps: AROM, Both, 15 reps   Assessment/Plan    PT Assessment Patient needs continued PT services  PT Problem List Decreased strength;[name];Decreased activity tolerance;Decreased balance;Decreased mobility;Decreased coordination;Pain       PT Treatment Interventions DME instruction;Gait training;Functional mobility training;Therapeutic activities;Therapeutic exercise;Balance training;Neuromuscular re-education;Patient/family education;Modalities    PT Goals (Current goals can be found in the Care Plan section)  Acute Rehab PT Goals Patient Stated Goal: to be able to walk a mile, play golf and return to pickleball PT Goal Formulation: With patient Time For Goal Achievement: 08/09/23 Potential to Achieve Goals: Good    Frequency 7X/week     Co-evaluation               AM-PAC PT "6 Clicks" Mobility  Outcome Measure Help needed turning from your back to your side while in a flat bed without using bedrails?: A Little Help needed moving from lying on your back to sitting on the side of a flat bed without using bedrails?: A Little Help needed moving to and from a bed to a chair (including a wheelchair)?: A Little Help needed  standing up from a chair using your arms (e.g., wheelchair or bedside chair)?: A Little Help needed to walk in hospital room?: A Little Help needed climbing 3-5 steps with a railing? : A Lot 6 Click Score: 17    End of Session Equipment Utilized During Treatment: Gait belt Activity Tolerance: Patient tolerated treatment well;No increased pain Patient left: in chair;with call bell/phone within reach;with family/visitor present Nurse Communication: Mobility status PT Visit Diagnosis: Unsteadiness on feet (R26.81);Other abnormalities of gait and mobility (R26.89);Muscle weakness (generalized) (M62.81);Difficulty in walking, not elsewhere classified (R26.2);Pain Pain - Right/Left: Right Pain - part of body: Leg;Knee    Time: 1735-1754 PT Time Calculation (min) (ACUTE ONLY): 19 min   Charges:   PT Evaluation $PT Eval Low Complexity: 1 Low   PT General Charges $$ ACUTE PT VISIT: 1 Visit         Brandon Acosta, PT Acute Rehab   Brandon Acosta 07/26/2023, 6:08 PM

## 2023-07-26 NOTE — Plan of Care (Signed)
  Problem: Coping: Goal: Level of anxiety will decrease Outcome: Progressing   Problem: Pain Management: Goal: General experience of comfort will improve Outcome: Progressing   Problem: Safety: Goal: Ability to remain free from injury will improve Outcome: Progressing

## 2023-07-26 NOTE — Transfer of Care (Signed)
Immediate Anesthesia Transfer of Care Note  Patient: Brandon Acosta  Procedure(s) Performed: RIGHT TOTAL KNEE ARTHROPLASTY (Right: Knee)  Patient Location: PACU  Anesthesia Type:Spinal  Level of Consciousness: awake, alert , oriented, and patient cooperative  Airway & Oxygen Therapy: Patient Spontanous Breathing and Patient connected to face mask oxygen  Post-op Assessment: Report given to RN and Post -op Vital signs reviewed and stable  Post vital signs: Reviewed and stable  Last Vitals:  Vitals Value Taken Time  BP    Temp    Pulse    Resp    SpO2      Last Pain:  Vitals:   07/26/23 1040  TempSrc:   PainSc: 0-No pain         Complications: No notable events documented.

## 2023-07-26 NOTE — Anesthesia Preprocedure Evaluation (Addendum)
Anesthesia Evaluation  Patient identified by MRN, date of birth, ID band Patient awake    Reviewed: Allergy & Precautions, NPO status , Patient's Chart, lab work & pertinent test results  Airway Mallampati: III     Mouth opening: Limited Mouth Opening  Dental  (+) Teeth Intact, Dental Advisory Given   Pulmonary former smoker   breath sounds clear to auscultation       Cardiovascular  Rhythm:Regular Rate:Normal     Neuro/Psych negative neurological ROS  negative psych ROS   GI/Hepatic Neg liver ROS,GERD  Medicated,,  Endo/Other  negative endocrine ROS    Renal/GU negative Renal ROS     Musculoskeletal  (+) Arthritis ,    Abdominal   Peds  Hematology negative hematology ROS (+)   Anesthesia Other Findings - HLD  Reproductive/Obstetrics                             Anesthesia Physical Anesthesia Plan  ASA: 3  Anesthesia Plan: Spinal   Post-op Pain Management: Tylenol PO (pre-op)* and Regional block*   Induction: Intravenous  PONV Risk Score and Plan: 2 and Ondansetron, Dexamethasone and Midazolam  Airway Management Planned: Nasal Cannula and Natural Airway  Additional Equipment: None  Intra-op Plan:   Post-operative Plan:   Informed Consent: I have reviewed the patients History and Physical, chart, labs and discussed the procedure including the risks, benefits and alternatives for the proposed anesthesia with the patient or authorized representative who has indicated his/her understanding and acceptance.       Plan Discussed with: CRNA  Anesthesia Plan Comments: (Lab Results      Component                Value               Date                      WBC                      5.2                 07/18/2023                HGB                      14.0                07/18/2023                HCT                      42.0                07/18/2023                MCV                       101.2 (H)           07/18/2023                PLT                      210                 07/18/2023           )  Anesthesia Quick Evaluation

## 2023-07-26 NOTE — Op Note (Signed)
Operative Note  Date of operation: 07/26/2023 Preoperative diagnosis: Right knee primary osteoarthritis Postoperative diagnosis: Same  Procedure: Right cemented total knee arthroplasty  Implants: Biomet/Zimmer system cemented knee system Implant Name Type Inv. Item Serial No. Manufacturer Lot No. LRB No. Used Action  CEMENT BONE R 1X40 - [id number] Cement CEMENT BONE R 1X40  ZIMMER RECON(ORTH,TRAU,BIO,SG) [id number] Right 2 Implanted  COMP FEM CMT PERSONA SZ9 RT - [id number] Joint COMP FEM CMT PERSONA SZ9 RT  ZIMMER RECON(ORTH,TRAU,BIO,SG) [id number] Right 1 Implanted  COMP TIB CMT PS KNEE F 0D RT - [id number] Joint COMP TIB CMT PS KNEE F 0D RT  ZIMMER RECON(ORTH,TRAU,BIO,SG) [id number] Right 1 Implanted  STEM POLY PAT PLY 80M KNEE - [id number] Knees STEM POLY PAT PLY 80M KNEE  ZIMMER RECON(ORTH,TRAU,BIO,SG) [id number] Right 1 Implanted  INSERT TIB ARTISURF [id number] - [id number] Insert INSERT TIB ARTISURF [id number]  ZIMMER RECON(ORTH,TRAU,BIO,SG) [id number] Right 1 Implanted   Surgeon: Vanita Panda. Magnus Ivan, MD Assistant: Rexene Edison, PA-C  Anesthesia: #1 right lower extremity adductor canal block, #2 spinal, #3 local Tourniquet time: Under 1 hour EBL: Less than 100 cc Complications: None  Indications: The patient is an 82 year old gentleman well-known to [place].  We have seen him for many years now due to debilitating arthritis involving his right knee.  He has tried and failed all forms of conservative treatment and now his knee is gotten to where it is detrimentally affecting his daily life including his mobility, his activities of daily living and just his quality of life in general.  He wishes to proceed with a total knee arthroplasty and we agree with this as well.  We did discuss the risk of acute blood loss anemia, nerve or vessel injury, fracture, infection, DVT and implant failure.  He understands that our goals are hopefully decreased pain, improved mobility, and improved quality of  life.  Procedure description: After informed consent was obtained and the appropriate right knee was marked, anesthesia obtained a right lower extremity adductor canal block in the holding room.  The patient was then brought to the operating room and set up on the operating table where spinal anesthesia was obtained.  He was laid in some oppositional on the operating table and a Foley catheter was placed.  A nonsterile tourniquet was placed around his upper right thigh and his right thigh, knee, leg, ankle and foot were prepped and draped with DuraPrep and sterile drapes including a sterile stockinette.  A timeout was called and he was identified as the correct patient and the correct right knee.  An Esmarch was then used to wrap out the knee and the tourniquet was inflated to 300 mm of pressure.  With the knee extended a direct midline incision was made over the patella and carried proximally distally.  Dissection was carried down to the knee joint and a medial parapatellar arthrotomy was made finding a large joint effusion.  With the knee in a flexed position we found complete loss of the cartilage in all 3 compartments of the knee especially the medial compartment of the knee with varus malalignment.  Remnants of the ACL as well as medial and lateral meniscus were removed.  We then used an extramedullary cutting guide for making our proximal tibia cut making this cut for a 7 degree slope correcting for varus and valgus as well.  The cut was made to take 2 mm off the low side and we backed this down to more millimeters.  He did have a flexion contracture of note  starting.  We then used an intramedullary cutting guide for making her distal femur cut setting this for right knee at 5 degrees externally rotated and a 10 mm distal femoral cut.  We brought the knee back down to full extension and we achieved full extension with a 10 mm extension block.  We then went back to the femur and put a femoral sizing guide  based off the epicondylar axis.  Based off of this we chose a size 9 femur.  We put a 4-in-1 cutting block for a size 9 femur and made the anterior and posterior cuts followed our chamfer cuts.  We then went back to the tibia and chose a size F right tibial tray for coverage over the tibial plateau setting the rotation off of the femur and the tibial tubercle.  We did a drill hole and keel punch off of this.  We then trialed our size F right tibial tray combined with our size 9 right CR standard femur.  We placed a 10 mm medial congruent polyethylene insert and we increased that to a 12 mm insert and I was pleased with range of motion and stability without insert.  We then made our patella cut drilled 3 holes for a size 32 patella button.  Again with all trial instrumentation in the knee we are pleased with range of motion and stability.  We then removed all transportation from the knee and irrigate the knee with normal saline solution.  Marcaine with epinephrine was placed around the arthrotomy.  We then mixed our cement with the knee in a flexed position we cemented our Biomet/Zimmer persona tibial tray for a right knee size F followed by cementing our size 9.  We placed a 12 mm fixed-bearing medial congruent right polythene insert and then cemented our size 32 patella button.  The knee was then held fully extended while we compress the knee to allow the cement to harden.  Once it hardened the tourniquet was let down and hemostasis was obtained with electrocautery.  The arthrotomy was then closed with interrupted #1 Vicryl suture followed by 0 Vicryl to close the deep tissue and 2-0 Vicryl to close subcutaneous tissue.  The skin was closed with staples.  Well-padded sterile dressing was applied.  The patient was taken to the recovery room in stable condition.  They did note some EKG changes during surgery in terms of a different arrhythmia so the plan was to obtain an EKG in the PACU and address this is  accordingly.  There was no instability from a vital sign standpoint for the patient during surgery either.  Rexene Edison, PA-C was present and assisted during the entire case and beginning to end and his assistance was crucial and medically necessary for soft tissue management and retraction, helping guide implant placement and a layered closure of the wound.

## 2023-07-26 NOTE — Anesthesia Postprocedure Evaluation (Signed)
Anesthesia Post Note  Patient: Holston Oyama  Procedure(s) Performed: RIGHT TOTAL KNEE ARTHROPLASTY (Right: Knee)     Patient location during evaluation: PACU Anesthesia Type: Spinal Level of consciousness: oriented and awake and alert Pain management: pain level controlled Vital Signs Assessment: post-procedure vital signs reviewed and stable Respiratory status: spontaneous breathing, respiratory function stable and patient connected to nasal cannula oxygen Cardiovascular status: blood pressure returned to baseline and stable Postop Assessment: no headache, no backache and no apparent nausea or vomiting Anesthetic complications: no  No notable events documented.  Last Vitals:  Vitals:   07/26/23 1445 07/26/23 1500  BP: 124/73 121/82  Pulse: (!) 45 64  Resp: 14 16  Temp: 36.6 C   SpO2: 100% 100%    Last Pain:  Vitals:   07/26/23 1500  TempSrc:   PainSc: 0-No pain    LLE Motor Response: Purposeful movement (07/26/23 1500) LLE Sensation: Tingling (07/26/23 1500) RLE Motor Response: Purposeful movement (07/26/23 1500) RLE Sensation: Tingling (07/26/23 1500) L Sensory Level: S1-Sole of foot, small toes (07/26/23 1500) R Sensory Level: S1-Sole of foot, small toes (07/26/23 1500)  Shelton Silvas

## 2023-07-26 NOTE — Interval H&P Note (Signed)
History and Physical Interval Note: Patient understands that he is here today for a right total knee replacement to treat his severe right knee arthritis.  There has been no acute or interval change in his medical status.  The risks and benefits of surgery been discussed in detail and informed consent has been obtained.  The right operative knee has been marked.  07/26/2023 10:22 AM  Brandon Acosta  has presented today for surgery, with the diagnosis of osteoarthritis right knee.  The various methods of treatment have been discussed with the patient and family. After consideration of risks, benefits and other options for treatment, the patient has consented to  Procedure(s): RIGHT TOTAL KNEE ARTHROPLASTY (Right) as a surgical intervention.  The patient's history has been reviewed, patient examined, no change in status, stable for surgery.  I have reviewed the patient's chart and labs.  Questions were answered to the patient's satisfaction.     Kathryne Hitch

## 2023-07-26 NOTE — Discharge Instructions (Signed)

## 2023-07-27 DIAGNOSIS — M1711 Unilateral primary osteoarthritis, right knee: Secondary | ICD-10-CM | POA: Diagnosis not present

## 2023-07-27 DIAGNOSIS — Z87891 Personal history of nicotine dependence: Secondary | ICD-10-CM | POA: Diagnosis not present

## 2023-07-27 LAB — CBC
HCT: 37.6 % — ABNORMAL LOW (ref 39.0–52.0)
Hemoglobin: 12.5 g/dL — ABNORMAL LOW (ref 13.0–17.0)
MCH: 33.5 pg (ref 26.0–34.0)
MCHC: 33.2 g/dL (ref 30.0–36.0)
MCV: 100.8 fL — ABNORMAL HIGH (ref 80.0–100.0)
Platelets: 202 10*3/uL (ref 150–400)
RBC: 3.73 MIL/uL — ABNORMAL LOW (ref 4.22–5.81)
RDW: 12.8 % (ref 11.5–15.5)
WBC: 12.4 10*3/uL — ABNORMAL HIGH (ref 4.0–10.5)
nRBC: 0 % (ref 0.0–0.2)

## 2023-07-27 LAB — BASIC METABOLIC PANEL
Anion gap: 7 (ref 5–15)
BUN: 16 mg/dL (ref 8–23)
CO2: 26 mmol/L (ref 22–32)
Calcium: 8.6 mg/dL — ABNORMAL LOW (ref 8.9–10.3)
Chloride: 101 mmol/L (ref 98–111)
Creatinine, Ser: 0.74 mg/dL (ref 0.61–1.24)
GFR, Estimated: >60 mL/min (ref >60)
Glucose, Bld: 156 mg/dL — ABNORMAL HIGH (ref 70–99)
Potassium: 4.1 mmol/L (ref 3.5–5.1)
Sodium: 134 mmol/L — ABNORMAL LOW (ref 135–145)

## 2023-07-27 MED ORDER — HYDROCODONE-ACETAMINOPHEN 5-325 MG PO TABS: 1.0000 | ORAL_TABLET | Freq: Four times a day (QID) | ORAL | 0 refills | Status: DC | PRN

## 2023-07-27 MED ORDER — TIZANIDINE HCL 2 MG PO TABS: 2.0000 mg | ORAL_TABLET | Freq: Four times a day (QID) | ORAL | 0 refills | Status: DC | PRN

## 2023-07-27 MED ORDER — ASPIRIN 81 MG PO CHEW: 81.0000 mg | CHEWABLE_TABLET | Freq: Two times a day (BID) | ORAL | 0 refills | Status: DC

## 2023-07-27 MED ORDER — HYDROCODONE-ACETAMINOPHEN 5-325 MG PO TABS: 1.0000 | ORAL_TABLET | ORAL | Status: DC | PRN

## 2023-07-27 NOTE — Progress Notes (Signed)
Physical Therapy Treatment Patient Details Name: Brandon Acosta MRN: [id number] DOB: [birth year] Today's Date: 07/27/2023   History of Present Illness 82 yo male presents to therapy s/p R TKA on 07/26/2023 due to failure of conservative measures. Pt PMH includes but is not limited to: HLD, recurrent UTI, mitral valve regurgitation, Atrial tachycardia, kidney stones, angina, diverticulitis, GERD, and HTN.    PT Comments  Reviewed/practiced exercises, gait training, and stair training. Issued HEP for pt to follow at home until be begins HHPT. Encouraged pt to wait for RW that was recommended for him (wife stated they would just leave without the walker-they appear anxious to d/c-made RN aware). All PT education completed.   If plan is discharge home, recommend the following: A little help with walking and/or transfers;A little help with bathing/dressing/bathroom;Assistance with cooking/housework;Assist for transportation;Help with stairs or ramp for entrance   Can travel by private vehicle        Equipment Recommendations       Recommendations for Other Services       Precautions / Restrictions Precautions Precautions: Fall;Knee Restrictions Weight Bearing Restrictions: No RLE Weight Bearing: Weight bearing as tolerated     Mobility  Bed Mobility               General bed mobility comments: oob in recliner    Transfers Overall transfer level: Needs assistance Equipment used: Rolling walker (2 wheels) Transfers: Sit to/from Stand Sit to Stand: Supervision           General transfer comment: Supv for safety.    Ambulation/Gait Ambulation/Gait assistance: Supervision Gait Distance (Feet): 150 Feet Assistive device: Rolling walker (2 wheels) Gait Pattern/deviations: Step-through pattern, Decreased stride length       General Gait Details: Supv for safety. Fair gait speed. Pt deneid dizziness. Tolerated distance well.   Stairs             Wheelchair  Mobility     Tilt Bed    Modified Rankin (Stroke Patients Only)       Balance Overall balance assessment: Needs assistance         Standing balance support: Bilateral upper extremity supported, During functional activity, Reliant on assistive device for balance Standing balance-Leahy Scale: Fair                              Cognition Arousal: Alert Behavior During Therapy: WFL for tasks assessed/performed Overall Cognitive Status: Within Functional Limits for tasks assessed                                          Exercises Total Joint Exercises Ankle Circles/Pumps: AROM, Both, 10 reps Quad Sets: AROM, Right, 10 reps Heel Slides: AAROM, Right, 10 reps Hip ABduction/ADduction: AROM, Right, 10 reps Straight Leg Raises: AROM, Right, 10 reps Goniometric ROM: ~10-65 degrees    General Comments        Pertinent Vitals/Pain Pain Assessment Pain Assessment: 0-10 Pain Score: 6  Pain Location: R knee with activity Pain Descriptors / Indicators: Aching, Constant, Discomfort, Operative site guarding Pain Intervention(s): Monitored during session, Repositioned    Home Living                          Prior Function            PT  Goals (current goals can now be found in the care plan section) Progress towards PT goals: Progressing toward goals    Frequency    7X/week      PT Plan      Co-evaluation              AM-PAC PT "6 Clicks" Mobility   Outcome Measure  Help needed turning from your back to your side while in a flat bed without using bedrails?: A Little Help needed moving from lying on your back to sitting on the side of a flat bed without using bedrails?: A Little Help needed moving to and from a bed to a chair (including a wheelchair)?: A Little Help needed standing up from a chair using your arms (e.g., wheelchair or bedside chair)?: A Little Help needed to walk in hospital room?: A Little Help needed  climbing 3-5 steps with a railing? : A Little 6 Click Score: 18    End of Session Equipment Utilized During Treatment: Gait belt Activity Tolerance: Patient tolerated treatment well Patient left: in chair;with call bell/phone within reach;with family/visitor present   PT Visit Diagnosis: Unsteadiness on feet (R26.81);Other abnormalities of gait and mobility (R26.89);Muscle weakness (generalized) (M62.81);Difficulty in walking, not elsewhere classified (R26.2);Pain Pain - Right/Left: Right Pain - part of body: Knee     Time: 1100-1129 PT Time Calculation (min) (ACUTE ONLY): 29 min  Charges:    $Gait Training: 8-22 mins $Therapeutic Exercise: 8-22 mins PT General Charges $$ ACUTE PT VISIT: 1 Visit                        Faye Ramsay, PT Acute Rehabilitation  Office: [phone]

## 2023-07-27 NOTE — Progress Notes (Signed)
Subjective: 1 Day Post-Op Procedure(s) (LRB): RIGHT TOTAL KNEE ARTHROPLASTY (Right) Patient reports pain as moderate.  Hopes to go home today if clears PT.  Also, wants hydrocod vs oxy.  Objective: Vital signs in last 24 hours: Temp:  [97.5 F (36.4 C)-98.4 F (36.9 C)] 97.6 F (36.4 C) (11/02 1005) Pulse Rate:  [41-85] 58 (11/02 1005) Resp:  [11-18] 18 (11/02 1005) BP: (92-140)/(53-83) 117/58 (11/02 1005) SpO2:  [98 %-100 %] 100 % (11/02 1005)  Intake/Output from previous day: 11/01 0701 - 11/02 0700 In: 1970 [P.O.:520; I.V.:950; IV Piggyback:500] Out: 2200 [Urine:2150; Blood:50] Intake/Output this shift: Total I/O In: 3000 [P.O.:3000] Out: -   Recent Labs    07/27/23 0310  HGB 12.5*   Recent Labs    07/27/23 0310  WBC 12.4*  RBC 3.73*  HCT 37.6*  PLT 202   Recent Labs    07/27/23 0310  NA 134*  K 4.1  CL 101  CO2 26  BUN 16  CREATININE 0.74  GLUCOSE 156*  CALCIUM 8.6*   No results for input(s): "LABPT", "INR" in the last 72 hours.  Sensation intact distally Intact pulses distally Dorsiflexion/Plantar flexion intact Incision: dressing C/D/I Compartment soft   Assessment/Plan: 1 Day Post-Op Procedure(s) (LRB): RIGHT TOTAL KNEE ARTHROPLASTY (Right) Up with therapy Discharge home with home health      Kathryne Hitch 07/27/2023, 11:04 AM

## 2023-07-27 NOTE — Progress Notes (Signed)
Transition of Care [facility]) - Inpatient Brief Assessment   Patient Details  Name: Brandon Acosta MRN: [id number] Date of Birth: 1941-04-21  [facility] CM/SW Contact:    Georgie Chard, LCSW Phone Number: 07/27/2023, 12:06 PM   Clinical Narrative: CSW spoke to the patient's wife. At this time wife has declined resources due to family setting up out side HHPT. Wife reports family friend has walker. Family reports having no TOC needs at this time.    Transition of Care Asessment: Insurance and Status: (P) Insurance coverage has been reviewed Patient has primary care physician: (P) Yes Home environment has been reviewed: (P) Lives with wife Prior level of function:: (P) walking   Social Determinants of Health Reivew: (P) SDOH reviewed no interventions necessary Readmission risk has been reviewed: (P) Yes Transition of care needs: (P) no transition of care needs at this time

## 2023-07-28 NOTE — Discharge Summary (Signed)
Patient ID: Brandon Acosta MRN: [id number] DOB/AGE: April 01, 1941 82 y.o.  Admit date: 07/26/2023 Discharge date: 07/27/23  Admission Diagnoses:  Principal Problem:   Primary osteoarthritis of right knee Active Problems:   Status post total right knee replacement   Discharge Diagnoses:  Same  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HCC)    Arthritis    Diverticulitis    GERD (gastroesophageal reflux disease)    History of kidney stones    Hyperlipemia    Hyperlipidemia    Osteoporosis    Palpitations     Surgeries: Procedure(s): RIGHT TOTAL KNEE ARTHROPLASTY on 07/26/2023   Consultants:   Discharged Condition: Improved  Hospital Course: Brandon Acosta is an 82 y.o. male who was admitted 07/26/2023 for operative treatment ofPrimary osteoarthritis of right knee. Patient has severe unremitting pain that affects sleep, daily activities, and work/hobbies. After pre-op clearance the patient was taken to the operating room on 07/26/2023 and underwent  Procedure(s): RIGHT TOTAL KNEE ARTHROPLASTY.    Patient was given perioperative antibiotics:  Anti-infectives (From admission, onward)    Start     Dose/Rate Route Frequency Ordered Stop   07/26/23 1800  ceFAZolin (ANCEF) IVPB 1 g/50 mL premix        1 g 100 mL/hr over 30 Minutes Intravenous Every 6 hours 07/26/23 1536 07/27/23 0012   07/26/23 0930  ceFAZolin (ANCEF) IVPB 2g/100 mL premix        2 g 200 mL/hr over 30 Minutes Intravenous On call to O.R. 07/26/23 0917 07/26/23 1159        Patient was given sequential compression devices, early ambulation, and chemoprophylaxis to prevent DVT.  Patient benefited maximally from hospital stay and there were no complications.    Recent vital signs: No data found.   Recent laboratory studies:  Recent Labs    07/27/23 0310  WBC 12.4*  HGB 12.5*  HCT 37.6*  PLT 202  NA 134*  K 4.1  CL 101  CO2 26  BUN 16  CREATININE 0.74  GLUCOSE 156*  CALCIUM 8.6*      Discharge Medications:   Allergies as of 07/27/2023       Reactions   Simvastatin Other (See Comments)   Leg cramps        Medication List     TAKE these medications    aspirin 81 MG chewable tablet Chew 1 tablet (81 mg total) by mouth 2 (two) times daily.   ezetimibe 10 MG tablet Commonly known as: ZETIA Take 1 tablet (10 mg total) by mouth daily.   HYDROcodone-acetaminophen 5-325 MG tablet Commonly known as: NORCO/VICODIN Take 1-2 tablets by mouth every 6 (six) hours as needed for severe pain (pain score 7-10).   ibuprofen 200 MG tablet Commonly known as: ADVIL Take 400 mg by mouth daily as needed for fever or moderate pain.   omeprazole 20 MG capsule Commonly known as: PRILOSEC Take 20 mg by mouth daily.   oxymetazoline 0.05 % nasal spray Commonly known as: AFRIN Place 1 spray into both nostrils at bedtime.   tiZANidine 2 MG tablet Commonly known as: ZANAFLEX Take 1 tablet (2 mg total) by mouth every 6 (six) hours as needed for muscle spasms.        Diagnostic Studies: DG Knee Right Port  Result Date: 07/26/2023 CLINICAL DATA:  Status post total right knee arthroplasty. EXAM: PORTABLE RIGHT KNEE - 1-2 VIEW COMPARISON:  Right knee radiographs 06/19/2023 FINDINGS: Interval total right knee arthroplasty. No perihardware lucency  is seen to indicate hardware failure or loosening. Expected postoperative changes including intra-articular and subcutaneous air. Mild-to-moderatejoint effusion. Anterior surgical skin staples. No acute fracture or dislocation. IMPRESSION: Interval total right knee arthroplasty without evidence of hardware failure. Electronically Signed   By: Neita Garnet M.D.   On: 07/26/2023 16:29    Disposition: Discharge disposition: 01-Home or Self Care          Follow-up Information     Kathryne Hitch, MD Follow up in 2 week(s).   Specialty: Orthopedic Surgery Contact information: [address] [place]  [address] [phone]                  Signed: Kathryne Hitch 07/28/2023, 2:04 PM

## 2023-07-29 ENCOUNTER — Telehealth: Payer: Self-pay | Admitting: *Deleted

## 2023-07-29 ENCOUNTER — Encounter (HOSPITAL_COMMUNITY): Payer: Self-pay | Admitting: Orthopaedic Surgery

## 2023-07-29 DIAGNOSIS — Z471 Aftercare following joint replacement surgery: Secondary | ICD-10-CM | POA: Diagnosis not present

## 2023-07-29 DIAGNOSIS — M25561 Pain in right knee: Secondary | ICD-10-CM | POA: Diagnosis not present

## 2023-07-29 DIAGNOSIS — M6281 Muscle weakness (generalized): Secondary | ICD-10-CM | POA: Diagnosis not present

## 2023-07-29 DIAGNOSIS — R262 Difficulty in walking, not elsewhere classified: Secondary | ICD-10-CM | POA: Diagnosis not present

## 2023-07-29 NOTE — Telephone Encounter (Signed)
Ortho bundle D/C call completed. 

## 2023-07-31 DIAGNOSIS — M25561 Pain in right knee: Secondary | ICD-10-CM | POA: Diagnosis not present

## 2023-07-31 DIAGNOSIS — Z471 Aftercare following joint replacement surgery: Secondary | ICD-10-CM | POA: Diagnosis not present

## 2023-07-31 DIAGNOSIS — R262 Difficulty in walking, not elsewhere classified: Secondary | ICD-10-CM | POA: Diagnosis not present

## 2023-07-31 DIAGNOSIS — M6281 Muscle weakness (generalized): Secondary | ICD-10-CM | POA: Diagnosis not present

## 2023-08-01 ENCOUNTER — Other Ambulatory Visit: Payer: Self-pay | Admitting: Orthopaedic Surgery

## 2023-08-01 ENCOUNTER — Telehealth: Payer: Self-pay | Admitting: *Deleted

## 2023-08-01 MED ORDER — HYDROCODONE-ACETAMINOPHEN 5-325 MG PO TABS: 1.0000 | ORAL_TABLET | Freq: Four times a day (QID) | ORAL | 0 refills | Status: DC | PRN

## 2023-08-01 MED ORDER — TIZANIDINE HCL 2 MG PO TABS: 2.0000 mg | ORAL_TABLET | Freq: Four times a day (QID) | ORAL | 0 refills | Status: DC | PRN

## 2023-08-01 NOTE — Telephone Encounter (Signed)
Patient left VM this morning requesting refill of pain medication and muscle relaxer. He will be out over the weekend. Doing well with therapy there at [facility] and feels he is making progress. Thank you

## 2023-08-01 NOTE — Telephone Encounter (Signed)
Spoke to patient and updated on refills sent to pharmacy. Discussed bruising and activity, swelling and pain. Doing well overall.

## 2023-08-02 DIAGNOSIS — Z471 Aftercare following joint replacement surgery: Secondary | ICD-10-CM | POA: Diagnosis not present

## 2023-08-02 DIAGNOSIS — M6281 Muscle weakness (generalized): Secondary | ICD-10-CM | POA: Diagnosis not present

## 2023-08-02 DIAGNOSIS — M25561 Pain in right knee: Secondary | ICD-10-CM | POA: Diagnosis not present

## 2023-08-02 DIAGNOSIS — R262 Difficulty in walking, not elsewhere classified: Secondary | ICD-10-CM | POA: Diagnosis not present

## 2023-08-04 ENCOUNTER — Encounter (HOSPITAL_BASED_OUTPATIENT_CLINIC_OR_DEPARTMENT_OTHER): Payer: Self-pay | Admitting: Emergency Medicine

## 2023-08-04 ENCOUNTER — Emergency Department (HOSPITAL_BASED_OUTPATIENT_CLINIC_OR_DEPARTMENT_OTHER)
Admission: EM | Admit: 2023-08-04 | Discharge: 2023-08-04 | Disposition: A | Discharge status: Home / Self Care | Payer: Medicare Other | Attending: Emergency Medicine | Admitting: Emergency Medicine

## 2023-08-04 DIAGNOSIS — K625 Hemorrhage of anus and rectum: Secondary | ICD-10-CM | POA: Diagnosis present

## 2023-08-04 DIAGNOSIS — K5641 Fecal impaction: Secondary | ICD-10-CM | POA: Insufficient documentation

## 2023-08-04 MED ORDER — POLYETHYLENE GLYCOL 3350 17 G PO PACK: 17.0000 g | PACK | Freq: Every day | ORAL | 0 refills | Status: DC

## 2023-08-04 NOTE — ED Provider Notes (Signed)
[facility] Provider Note   CSN: [id number] Arrival date & time: 08/04/23  1359     History Chief Complaint  Patient presents with   Rectal Bleeding   Constipation    HPI Brandon Acosta is a 82 y.o. male presenting for chief complaint of rectal bleeding.  States that he was suffering from fecal impaction earlier this week after take hydrocodone for a knee replacement.  He has been trying 3X suppositories, multiple over-the-counter laxatives without much symptomatic improvement.  Today he performed a manual disimpaction on himself and came in for inability to defecate.  While he was waiting in the lobby he had a large bowel movement.  He states he feels completely resolved at this time but did have some trace blood as part of that bowel movement..   Patient's recorded medical, surgical, social, medication list and allergies were reviewed in the Snapshot window as part of the initial history.   Review of Systems   Review of Systems  Constitutional:  Negative for chills and fever.  HENT:  Negative for ear pain and sore throat.   Eyes:  Negative for pain and visual disturbance.  Respiratory:  Negative for cough and shortness of breath.   Cardiovascular:  Negative for chest pain and palpitations.  Gastrointestinal:  Positive for constipation. Negative for abdominal pain and vomiting.  Genitourinary:  Negative for dysuria and hematuria.  Musculoskeletal:  Negative for arthralgias and back pain.  Skin:  Negative for color change and rash.  Neurological:  Negative for seizures and syncope.  All other systems reviewed and are negative.   Physical Exam Updated Vital Signs BP 136/65 (BP Location: Left Arm)   Pulse 72   Temp 97.9 F (36.6 C) (Oral)   Resp 16   Ht 5\' 8"  (1.727 m)   Wt 65.8 kg   SpO2 98%   BMI 22.06 kg/m  Physical Exam Vitals and nursing note reviewed.  Constitutional:      General: He is not in acute distress.     Appearance: He is well-developed.  HENT:     Head: Normocephalic and atraumatic.  Eyes:     Conjunctiva/sclera: Conjunctivae normal.  Cardiovascular:     Rate and Rhythm: Normal rate and regular rhythm.  Pulmonary:     Effort: Pulmonary effort is normal. No respiratory distress.  Abdominal:     General: Abdomen is flat. There is no distension.  Genitourinary:    Comments: Rectal exam with no blood in the rectal vault.  Small amount of brown stool.  No impaction palpated. Musculoskeletal:        General: No swelling or deformity.  Skin:    General: Skin is warm and dry.     Capillary Refill: Capillary refill takes less than 2 seconds.  Neurological:     Mental Status: He is alert and oriented to person, place, and time. Mental status is at baseline.      ED Course/ Medical Decision Making/ A&P    Procedures Procedures   Medications Ordered in ED Medications - No data to display  Medical Decision Making:   Patient's history of present on his physicals and findings were consistent with a fecal impaction.  Patient treated himself at home and no longer is impacted at this time.  He was worried about possible trauma given the rectal bleeding but this appears resolved at this time.  Discussed supportive care for his constipation and patient feels comfortable with discharge at this time.  Disposition:  I have considered need for hospitalization, however, considering all of the above, I believe this patient is stable for discharge at this time.  Patient/family educated about specific return precautions for given chief complaint and symptoms.  Patient/family educated about follow-up with PCP.     Patient/family expressed understanding of return precautions and need for follow-up. Patient spoken to regarding all imaging and laboratory results and appropriate follow up for these results. All education provided in verbal form with additional information in written form. Time was allowed  for answering of patient questions. Patient discharged.    Emergency Department Medication Summary:   Medications - No data to display      Clinical Impression:  1. Fecal impaction in rectum [facility])      Discharge   Final Clinical Impression(s) / ED Diagnoses Final diagnoses:  Fecal impaction in rectum [facility])    Rx / DC Orders ED Discharge Orders     None         Glyn Ade, MD 08/04/23 2251

## 2023-08-04 NOTE — ED Triage Notes (Signed)
Pt reports constipation (s/p knee replacement, taking hydrocodone); tried to disimpact himself today and started bleeding

## 2023-08-04 NOTE — Discharge Instructions (Addendum)
You were seen today for constipation. Please use MiraLAX going forward.  Start with 2 scoops of powder in 32 ounces of fluid.  Escalate to 4 scoops of powder in 32 ounces of fluid 6 hours later.  The next morning you may start with 4 scoops in 32 ounces of fluid and escalate to 8 to scoops in 32 ounces of fluid if you remain unsuccessful in having a bowel movement.  If this is ineffective, you will need to follow back up with your primary care provider.  Once you have had a substantial bowel movement decrease to 2 scoops a day and a minimum of 16 ounces of fluid per scoop.

## 2023-08-05 DIAGNOSIS — R262 Difficulty in walking, not elsewhere classified: Secondary | ICD-10-CM | POA: Diagnosis not present

## 2023-08-05 DIAGNOSIS — M6281 Muscle weakness (generalized): Secondary | ICD-10-CM | POA: Diagnosis not present

## 2023-08-05 DIAGNOSIS — Z471 Aftercare following joint replacement surgery: Secondary | ICD-10-CM | POA: Diagnosis not present

## 2023-08-05 DIAGNOSIS — M25561 Pain in right knee: Secondary | ICD-10-CM | POA: Diagnosis not present

## 2023-08-07 DIAGNOSIS — Z471 Aftercare following joint replacement surgery: Secondary | ICD-10-CM | POA: Diagnosis not present

## 2023-08-07 DIAGNOSIS — M25561 Pain in right knee: Secondary | ICD-10-CM | POA: Diagnosis not present

## 2023-08-07 DIAGNOSIS — M6281 Muscle weakness (generalized): Secondary | ICD-10-CM | POA: Diagnosis not present

## 2023-08-07 DIAGNOSIS — R262 Difficulty in walking, not elsewhere classified: Secondary | ICD-10-CM | POA: Diagnosis not present

## 2023-08-08 ENCOUNTER — Ambulatory Visit: Payer: Medicare Other | Admitting: Orthopaedic Surgery

## 2023-08-08 ENCOUNTER — Encounter: Payer: Self-pay | Admitting: Orthopaedic Surgery

## 2023-08-08 DIAGNOSIS — Z96651 Presence of right artificial knee joint: Secondary | ICD-10-CM

## 2023-08-08 NOTE — Progress Notes (Signed)
The patient is here today for his first postoperative visit status post a right total knee replacement.  He is doing well overall.  He and his wife are residents of [facility].  He has been getting therapy there.  His knee incision looks good on the right knee.  His staples were removed and Steri-Strips applied.  There is significant swelling to be expected.  His extension is almost full and I can flex him to 90 degrees.  His calf is soft.  There is a lot of bruising to be expected.  He is 82 years old and has been compliant with a baby aspirin twice a day.  He can stop aspirin from that standpoint.  He knows to wait at least 2 more weeks before driving on his own.  He needs to not have a narcotic in his system when he drives.  He needs to push himself hard through therapy as well even outside of PT.  Will see him back in 4 weeks for repeat exam but no x-rays are needed.

## 2023-08-09 DIAGNOSIS — Z471 Aftercare following joint replacement surgery: Secondary | ICD-10-CM | POA: Diagnosis not present

## 2023-08-09 DIAGNOSIS — R262 Difficulty in walking, not elsewhere classified: Secondary | ICD-10-CM | POA: Diagnosis not present

## 2023-08-09 DIAGNOSIS — M6281 Muscle weakness (generalized): Secondary | ICD-10-CM | POA: Diagnosis not present

## 2023-08-09 DIAGNOSIS — M25561 Pain in right knee: Secondary | ICD-10-CM | POA: Diagnosis not present

## 2023-08-12 DIAGNOSIS — M6281 Muscle weakness (generalized): Secondary | ICD-10-CM | POA: Diagnosis not present

## 2023-08-12 DIAGNOSIS — R262 Difficulty in walking, not elsewhere classified: Secondary | ICD-10-CM | POA: Diagnosis not present

## 2023-08-12 DIAGNOSIS — Z471 Aftercare following joint replacement surgery: Secondary | ICD-10-CM | POA: Diagnosis not present

## 2023-08-12 DIAGNOSIS — M25561 Pain in right knee: Secondary | ICD-10-CM | POA: Diagnosis not present

## 2023-08-14 DIAGNOSIS — R262 Difficulty in walking, not elsewhere classified: Secondary | ICD-10-CM | POA: Diagnosis not present

## 2023-08-14 DIAGNOSIS — M6281 Muscle weakness (generalized): Secondary | ICD-10-CM | POA: Diagnosis not present

## 2023-08-14 DIAGNOSIS — M25561 Pain in right knee: Secondary | ICD-10-CM | POA: Diagnosis not present

## 2023-08-14 DIAGNOSIS — Z471 Aftercare following joint replacement surgery: Secondary | ICD-10-CM | POA: Diagnosis not present

## 2023-08-16 DIAGNOSIS — Z471 Aftercare following joint replacement surgery: Secondary | ICD-10-CM | POA: Diagnosis not present

## 2023-08-16 DIAGNOSIS — R262 Difficulty in walking, not elsewhere classified: Secondary | ICD-10-CM | POA: Diagnosis not present

## 2023-08-16 DIAGNOSIS — M6281 Muscle weakness (generalized): Secondary | ICD-10-CM | POA: Diagnosis not present

## 2023-08-16 DIAGNOSIS — M25561 Pain in right knee: Secondary | ICD-10-CM | POA: Diagnosis not present

## 2023-08-19 DIAGNOSIS — M25561 Pain in right knee: Secondary | ICD-10-CM | POA: Diagnosis not present

## 2023-08-19 DIAGNOSIS — R262 Difficulty in walking, not elsewhere classified: Secondary | ICD-10-CM | POA: Diagnosis not present

## 2023-08-19 DIAGNOSIS — Z471 Aftercare following joint replacement surgery: Secondary | ICD-10-CM | POA: Diagnosis not present

## 2023-08-19 DIAGNOSIS — M6281 Muscle weakness (generalized): Secondary | ICD-10-CM | POA: Diagnosis not present

## 2023-08-21 DIAGNOSIS — Z471 Aftercare following joint replacement surgery: Secondary | ICD-10-CM | POA: Diagnosis not present

## 2023-08-21 DIAGNOSIS — M6281 Muscle weakness (generalized): Secondary | ICD-10-CM | POA: Diagnosis not present

## 2023-08-21 DIAGNOSIS — R262 Difficulty in walking, not elsewhere classified: Secondary | ICD-10-CM | POA: Diagnosis not present

## 2023-08-21 DIAGNOSIS — M25561 Pain in right knee: Secondary | ICD-10-CM | POA: Diagnosis not present

## 2023-08-23 DIAGNOSIS — M6281 Muscle weakness (generalized): Secondary | ICD-10-CM | POA: Diagnosis not present

## 2023-08-23 DIAGNOSIS — R262 Difficulty in walking, not elsewhere classified: Secondary | ICD-10-CM | POA: Diagnosis not present

## 2023-08-23 DIAGNOSIS — M25561 Pain in right knee: Secondary | ICD-10-CM | POA: Diagnosis not present

## 2023-08-23 DIAGNOSIS — Z471 Aftercare following joint replacement surgery: Secondary | ICD-10-CM | POA: Diagnosis not present

## 2023-08-26 DIAGNOSIS — R262 Difficulty in walking, not elsewhere classified: Secondary | ICD-10-CM | POA: Diagnosis not present

## 2023-08-26 DIAGNOSIS — Z471 Aftercare following joint replacement surgery: Secondary | ICD-10-CM | POA: Diagnosis not present

## 2023-08-26 DIAGNOSIS — M25561 Pain in right knee: Secondary | ICD-10-CM | POA: Diagnosis not present

## 2023-08-26 DIAGNOSIS — M6281 Muscle weakness (generalized): Secondary | ICD-10-CM | POA: Diagnosis not present

## 2023-08-28 ENCOUNTER — Ambulatory Visit (HOSPITAL_BASED_OUTPATIENT_CLINIC_OR_DEPARTMENT_OTHER): Payer: BLUE CROSS/BLUE SHIELD

## 2023-08-28 DIAGNOSIS — M6281 Muscle weakness (generalized): Secondary | ICD-10-CM | POA: Diagnosis not present

## 2023-08-28 DIAGNOSIS — Z471 Aftercare following joint replacement surgery: Secondary | ICD-10-CM | POA: Diagnosis not present

## 2023-08-28 DIAGNOSIS — R262 Difficulty in walking, not elsewhere classified: Secondary | ICD-10-CM | POA: Diagnosis not present

## 2023-08-28 DIAGNOSIS — M25561 Pain in right knee: Secondary | ICD-10-CM | POA: Diagnosis not present

## 2023-08-30 DIAGNOSIS — Z471 Aftercare following joint replacement surgery: Secondary | ICD-10-CM | POA: Diagnosis not present

## 2023-08-30 DIAGNOSIS — M6281 Muscle weakness (generalized): Secondary | ICD-10-CM | POA: Diagnosis not present

## 2023-08-30 DIAGNOSIS — R262 Difficulty in walking, not elsewhere classified: Secondary | ICD-10-CM | POA: Diagnosis not present

## 2023-08-30 DIAGNOSIS — M25561 Pain in right knee: Secondary | ICD-10-CM | POA: Diagnosis not present

## 2023-09-02 DIAGNOSIS — Z471 Aftercare following joint replacement surgery: Secondary | ICD-10-CM | POA: Diagnosis not present

## 2023-09-02 DIAGNOSIS — R262 Difficulty in walking, not elsewhere classified: Secondary | ICD-10-CM | POA: Diagnosis not present

## 2023-09-02 DIAGNOSIS — M25561 Pain in right knee: Secondary | ICD-10-CM | POA: Diagnosis not present

## 2023-09-02 DIAGNOSIS — M6281 Muscle weakness (generalized): Secondary | ICD-10-CM | POA: Diagnosis not present

## 2023-09-04 DIAGNOSIS — M6281 Muscle weakness (generalized): Secondary | ICD-10-CM | POA: Diagnosis not present

## 2023-09-04 DIAGNOSIS — R262 Difficulty in walking, not elsewhere classified: Secondary | ICD-10-CM | POA: Diagnosis not present

## 2023-09-04 DIAGNOSIS — M25561 Pain in right knee: Secondary | ICD-10-CM | POA: Diagnosis not present

## 2023-09-04 DIAGNOSIS — Z471 Aftercare following joint replacement surgery: Secondary | ICD-10-CM | POA: Diagnosis not present

## 2023-09-05 ENCOUNTER — Encounter: Payer: Self-pay | Admitting: Orthopaedic Surgery

## 2023-09-05 ENCOUNTER — Ambulatory Visit: Payer: Medicare Other | Admitting: Orthopaedic Surgery

## 2023-09-05 DIAGNOSIS — Z96651 Presence of right artificial knee joint: Secondary | ICD-10-CM

## 2023-09-05 NOTE — Progress Notes (Signed)
The patient is an active 82 year old gentleman who is now 6 weeks status post a right total knee arthroplasty.  He said they have flex him to 115 degrees.  He is almost getting full extension.  His biggest complaint is laying down at night and it does wake him up.  A lot of pains on the lateral aspect of his knee but overall he is doing well.  He is off of pain medications and is already driving.  He is inquired about getting back into the pool as well.  His right knee lacks full extension by just a few degrees and I can flex him to past to 100 degrees today.  It feels ligamentously stable.  From my standpoint he can swim.  He will continue to increase his activities as comfort allows.  We will see him back next in 3 months with a standing AP and lateral of his right knee.  If there are issues before then he knows to reach out and let us know.

## 2023-09-06 DIAGNOSIS — R262 Difficulty in walking, not elsewhere classified: Secondary | ICD-10-CM | POA: Diagnosis not present

## 2023-09-06 DIAGNOSIS — M25561 Pain in right knee: Secondary | ICD-10-CM | POA: Diagnosis not present

## 2023-09-06 DIAGNOSIS — Z471 Aftercare following joint replacement surgery: Secondary | ICD-10-CM | POA: Diagnosis not present

## 2023-09-06 DIAGNOSIS — M6281 Muscle weakness (generalized): Secondary | ICD-10-CM | POA: Diagnosis not present

## 2023-09-09 DIAGNOSIS — Z471 Aftercare following joint replacement surgery: Secondary | ICD-10-CM | POA: Diagnosis not present

## 2023-09-09 DIAGNOSIS — M25561 Pain in right knee: Secondary | ICD-10-CM | POA: Diagnosis not present

## 2023-09-09 DIAGNOSIS — M6281 Muscle weakness (generalized): Secondary | ICD-10-CM | POA: Diagnosis not present

## 2023-09-09 DIAGNOSIS — R262 Difficulty in walking, not elsewhere classified: Secondary | ICD-10-CM | POA: Diagnosis not present

## 2023-09-13 DIAGNOSIS — R262 Difficulty in walking, not elsewhere classified: Secondary | ICD-10-CM | POA: Diagnosis not present

## 2023-09-13 DIAGNOSIS — M25561 Pain in right knee: Secondary | ICD-10-CM | POA: Diagnosis not present

## 2023-09-13 DIAGNOSIS — Z471 Aftercare following joint replacement surgery: Secondary | ICD-10-CM | POA: Diagnosis not present

## 2023-09-13 DIAGNOSIS — M6281 Muscle weakness (generalized): Secondary | ICD-10-CM | POA: Diagnosis not present

## 2023-09-16 DIAGNOSIS — M6281 Muscle weakness (generalized): Secondary | ICD-10-CM | POA: Diagnosis not present

## 2023-09-16 DIAGNOSIS — Z471 Aftercare following joint replacement surgery: Secondary | ICD-10-CM | POA: Diagnosis not present

## 2023-09-16 DIAGNOSIS — M25561 Pain in right knee: Secondary | ICD-10-CM | POA: Diagnosis not present

## 2023-09-16 DIAGNOSIS — R262 Difficulty in walking, not elsewhere classified: Secondary | ICD-10-CM | POA: Diagnosis not present

## 2023-09-20 DIAGNOSIS — R262 Difficulty in walking, not elsewhere classified: Secondary | ICD-10-CM | POA: Diagnosis not present

## 2023-09-20 DIAGNOSIS — M6281 Muscle weakness (generalized): Secondary | ICD-10-CM | POA: Diagnosis not present

## 2023-09-20 DIAGNOSIS — M25561 Pain in right knee: Secondary | ICD-10-CM | POA: Diagnosis not present

## 2023-09-20 DIAGNOSIS — Z471 Aftercare following joint replacement surgery: Secondary | ICD-10-CM | POA: Diagnosis not present

## 2023-09-23 DIAGNOSIS — R262 Difficulty in walking, not elsewhere classified: Secondary | ICD-10-CM | POA: Diagnosis not present

## 2023-09-23 DIAGNOSIS — M25561 Pain in right knee: Secondary | ICD-10-CM | POA: Diagnosis not present

## 2023-09-23 DIAGNOSIS — Z471 Aftercare following joint replacement surgery: Secondary | ICD-10-CM | POA: Diagnosis not present

## 2023-09-23 DIAGNOSIS — M6281 Muscle weakness (generalized): Secondary | ICD-10-CM | POA: Diagnosis not present

## 2023-09-26 DIAGNOSIS — M6281 Muscle weakness (generalized): Secondary | ICD-10-CM | POA: Diagnosis not present

## 2023-09-26 DIAGNOSIS — Z471 Aftercare following joint replacement surgery: Secondary | ICD-10-CM | POA: Diagnosis not present

## 2023-09-26 DIAGNOSIS — R262 Difficulty in walking, not elsewhere classified: Secondary | ICD-10-CM | POA: Diagnosis not present

## 2023-09-26 DIAGNOSIS — M25561 Pain in right knee: Secondary | ICD-10-CM | POA: Diagnosis not present

## 2023-09-30 DIAGNOSIS — Z471 Aftercare following joint replacement surgery: Secondary | ICD-10-CM | POA: Diagnosis not present

## 2023-09-30 DIAGNOSIS — M25561 Pain in right knee: Secondary | ICD-10-CM | POA: Diagnosis not present

## 2023-09-30 DIAGNOSIS — R262 Difficulty in walking, not elsewhere classified: Secondary | ICD-10-CM | POA: Diagnosis not present

## 2023-09-30 DIAGNOSIS — M6281 Muscle weakness (generalized): Secondary | ICD-10-CM | POA: Diagnosis not present

## 2023-10-02 ENCOUNTER — Ambulatory Visit: Payer: Medicare Other | Admitting: Family Medicine

## 2023-10-02 ENCOUNTER — Ambulatory Visit: Payer: Self-pay | Admitting: Family

## 2023-10-02 VITALS — BP 136/72 | HR 115 | Temp 97.5°F | Resp 16 | Ht 68.0 in | Wt 146.0 lb

## 2023-10-02 DIAGNOSIS — R300 Dysuria: Secondary | ICD-10-CM | POA: Diagnosis not present

## 2023-10-02 LAB — POC URINALSYSI DIPSTICK (AUTOMATED)
Bilirubin, UA: NEGATIVE
Glucose, UA: NEGATIVE
Ketones, UA: NEGATIVE
Nitrite, UA: NEGATIVE
Protein, UA: NEGATIVE
Spec Grav, UA: <=1.005 — AB (ref 1.010–1.025)
Urobilinogen, UA: 0.2 U/dL
pH, UA: 6 (ref 5.0–8.0)

## 2023-10-02 MED ORDER — CIPROFLOXACIN HCL 500 MG PO TABS: 500.0000 mg | ORAL_TABLET | Freq: Two times a day (BID) | ORAL | 0 refills | Status: AC

## 2023-10-02 NOTE — Telephone Encounter (Signed)
  Chief Complaint: urinary symptoms  Symptoms: cloudy urine, urinary frequency,  Frequency: every 45 minutes urinating last night  Pertinent Negatives: Patient denies fevers, flank pain, frank blood in urine, nausea, vomiting Disposition: [] ED /[] Urgent Care (no appt availability in office) / [x] Appointment(In office/virtual)/ []  [place] [facility]/ [] Home Care/ [] Refused Recommended Disposition /[] [place] [facility]/ []  Follow-up with PCP Additional Notes: Patient states he tried to call his urologist but has not been able to receive a call back. Patient concerned due to history of kidney stones and history of UTI that led to sepsis. Patient agreeable to office visit today with available provider.   Copied from CRM [id number]. Topic: Clinical - Red Word Triage >> Oct 02, 2023 12:28 PM Laurier C wrote: Red Word that prompted transfer to Nurse Triage: Possible UTI or Kidney stones Symptoms include cloudy urine, and frequent use of the bathroom Reason for Disposition  Urinating more frequently than usual (i.e., frequency)  Answer Assessment - Initial Assessment Questions 1. SYMPTOM: What's the main symptom you're concerned about? (e.g., frequency, incontinence)     Frequency (states last night he was up every 45 minutes); cloudy urine.  2. ONSET: When did the  urinary symptoms  start?     Since last night.  3. PAIN: Is there any pain? If Yes, ask: How bad is it? (Scale: 1-10; mild, moderate, severe)     Denies pain at this time.  4. CAUSE: What do you think is causing the symptoms?     Patient states he has a history of UTI and kidney stones. He states he has been hospitalized due to sepsis from UTI in the past.  5. OTHER SYMPTOMS: Do you have any other symptoms? (e.g., blood in urine, fever, flank pain, pain with urination)     Burning with urination, felt like straining with urinating.  Protocols used: Urinary Symptoms-A-AH

## 2023-10-02 NOTE — Progress Notes (Signed)
 Chief Complaint  Patient presents with   Urinary Tract Infection    Discuss UTI symptom     Brandon Acosta is a 83 y.o. male here for possible UTI.  Duration: 2 days. Symptoms: Dysuria, urinary frequency, urinary hesitancy, and urgency Denies: hematuria, urinary retention, cloudiness fever, nausea, vomiting, flank pain, discharge Hx of recurrent UTI? Not recurrent but does have them every few years Circumcised? Yes Denies new sexual partners.  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HCC)    Arthritis    Diverticulitis    GERD (gastroesophageal reflux disease)    History of kidney stones    Hyperlipemia    Hyperlipidemia    Osteoporosis    Palpitations      BP 136/72   Pulse (!) 115   Temp (!) 97.5 F (36.4 C) (Oral)   Resp 16   Ht 5' 8 (1.727 m)   Wt 146 lb (66.2 kg)   SpO2 96%   BMI 22.20 kg/m  General: Awake, alert, appears stated age Heart: Tachycardic, regular rhythm Lungs: CTAB, normal respiratory effort, no accessory muscle usage Abd: BS+, soft, NT, ND, no masses or organomegaly MSK: No CVA tenderness, neg Lloyd's sign Psych: Age appropriate judgment and insight  Dysuria - Plan: ciprofloxacin  (CIPRO ) 500 MG tablet, POCT Urinalysis Dipstick (Automated), Urine Culture  UA suggestive of infection.  He has historically done well with ciprofloxacin  and prefers to go on that.  Will send in 7-day course.  Check culture. Seek immediate care if pt starts to develop fevers, new/worsening symptoms, uncontrollable N/V. F/u prn. The patient voiced understanding and agreement to the plan.  Mabel [place], DO 10/02/23 4:45 PM

## 2023-10-04 ENCOUNTER — Encounter: Payer: Self-pay | Admitting: Family Medicine

## 2023-10-04 DIAGNOSIS — Z471 Aftercare following joint replacement surgery: Secondary | ICD-10-CM | POA: Diagnosis not present

## 2023-10-04 DIAGNOSIS — M25561 Pain in right knee: Secondary | ICD-10-CM | POA: Diagnosis not present

## 2023-10-04 DIAGNOSIS — R262 Difficulty in walking, not elsewhere classified: Secondary | ICD-10-CM | POA: Diagnosis not present

## 2023-10-04 DIAGNOSIS — M6281 Muscle weakness (generalized): Secondary | ICD-10-CM | POA: Diagnosis not present

## 2023-10-04 LAB — URINE CULTURE
MICRO NUMBER:: 15932650
SPECIMEN QUALITY:: ADEQUATE

## 2023-10-10 DIAGNOSIS — L821 Other seborrheic keratosis: Secondary | ICD-10-CM | POA: Diagnosis not present

## 2023-10-10 DIAGNOSIS — Z85828 Personal history of other malignant neoplasm of skin: Secondary | ICD-10-CM | POA: Diagnosis not present

## 2023-10-10 DIAGNOSIS — D2371 Other benign neoplasm of skin of right lower limb, including hip: Secondary | ICD-10-CM | POA: Diagnosis not present

## 2023-10-10 DIAGNOSIS — L718 Other rosacea: Secondary | ICD-10-CM | POA: Diagnosis not present

## 2023-10-10 DIAGNOSIS — L570 Actinic keratosis: Secondary | ICD-10-CM | POA: Diagnosis not present

## 2023-10-15 DIAGNOSIS — H524 Presbyopia: Secondary | ICD-10-CM | POA: Diagnosis not present

## 2023-10-15 DIAGNOSIS — H2513 Age-related nuclear cataract, bilateral: Secondary | ICD-10-CM | POA: Diagnosis not present

## 2023-11-21 ENCOUNTER — Ambulatory Visit (INDEPENDENT_AMBULATORY_CARE_PROVIDER_SITE_OTHER): Payer: Medicare Other | Admitting: Family

## 2023-11-21 ENCOUNTER — Encounter: Payer: Self-pay | Admitting: Family

## 2023-11-21 VITALS — BP 130/60 | HR 52 | Ht 68.0 in | Wt 146.2 lb

## 2023-11-21 DIAGNOSIS — R8290 Unspecified abnormal findings in urine: Secondary | ICD-10-CM

## 2023-11-21 DIAGNOSIS — R194 Change in bowel habit: Secondary | ICD-10-CM | POA: Diagnosis not present

## 2023-11-21 DIAGNOSIS — R5383 Other fatigue: Secondary | ICD-10-CM

## 2023-11-21 DIAGNOSIS — R350 Frequency of micturition: Secondary | ICD-10-CM

## 2023-11-21 DIAGNOSIS — K8020 Calculus of gallbladder without cholecystitis without obstruction: Secondary | ICD-10-CM | POA: Diagnosis not present

## 2023-11-21 DIAGNOSIS — D509 Iron deficiency anemia, unspecified: Secondary | ICD-10-CM

## 2023-11-21 DIAGNOSIS — J329 Chronic sinusitis, unspecified: Secondary | ICD-10-CM

## 2023-11-21 NOTE — Progress Notes (Signed)
 Brandon Acosta is a 83 y.o. 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 90 tablet 3   omeprazole (PRILOSEC) 20 MG capsule Take 20 mg by mouth daily.     oxymetazoline (AFRIN) 0.05 % nasal spray Place 1 spray into both nostrils at bedtime.     No current facility-administered medications for this visit.    Allergies: Doxycycline and Simvastatin  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HCC)    Arthritis    Diverticulitis    GERD (gastroesophageal reflux disease)    History of kidney stones    Hyperlipemia    Hyperlipidemia    Osteoporosis    Palpitations     Past Surgical History:  Procedure Laterality Date   APPENDECTOMY  as child   CARDIAC CATHETERIZATION N/A 10/16/2016   Procedure: Left Heart Cath and Coronary Angiography;  Surgeon: Corky Crafts, MD;  Location: [facility] INVASIVE CV LAB;  Service: Cardiovascular;  Laterality: N/A;   COLONOSCOPY WITH PROPOFOL N/A 02/11/2014   Procedure: COLONOSCOPY WITH PROPOFOL;  Surgeon: Florencia Reasons, MD;  Location: [facility] ENDOSCOPY;  Service: Endoscopy;  Laterality: N/A;   HERNIA REPAIR  2024   KNEE ARTHROSCOPY Right 09/25/2011   torn meniscus repair   PROSTATE SURGERY      TONSILLECTOMY     TOTAL KNEE ARTHROPLASTY Right 07/26/2023   Procedure: RIGHT TOTAL KNEE ARTHROPLASTY;  Surgeon: Kathryne Hitch, MD;  Location: [facility] ORS;  Service: Orthopedics;  Laterality: Right;    Family History  Problem Relation Age of Onset   Alcoholism Mother    Breast cancer Mother    Heart disease Mother    Cancer - Lung Father    Breast cancer Sister    Cancer - Ovarian Sister    Breast cancer Sister    Tremor Sister     Social History   Tobacco Use   Smoking status: Former    Current packs/day: 0.00    Average packs/day: 0.5 packs/day for 20.0 years (10.0 ttl pk-yrs)    Types: Cigarettes    Start date: 09/24/1956    Quit date: 09/24/1976    Years since quitting: 47.1   Smokeless tobacco: Never  Substance Use Topics   Alcohol use: Yes    Comment: occassionally 1 glass wine per day    Subjective:   Concerned about persisting "strong" odor in nose- does feel that symptoms have worsened and intensity/ frequency of smell is worsening; has been working on increased water intake; Did take antibiotics for UTI in early January- did think smell was better while on antibiotics but did not clear completely; also feels that stool has strong odor and occasionally dark color;     Objective:  Vitals:   11/21/23 1313 11/21/23 1352  BP: (!) 118/44  130/60  Pulse: (!) 52   SpO2: 96%   Weight: 146 lb 3.2 oz (66.3 kg)   Height: 5\' 8"  (1.727 m)     General: Well developed, well nourished, in no acute distress  Skin : Warm and dry.  Head: Normocephalic and atraumatic  Eyes: Sclera and conjunctiva clear; pupils round and reactive to light; extraocular movements intact  Ears: External normal; canals clear; tympanic membranes normal  Oropharynx: Pink, supple. No suspicious lesions  Neck: Supple without thyromegaly, adenopathy  Lungs: Respirations unlabored; clear to auscultation bilaterally without wheeze, rales, rhonchi  CVS exam: normal rate and regular rhythm.  Neurologic:  Alert and oriented; speech intact; face symmetrical; moves all extremities well; CNII-XII intact without focal deficit   Assessment:  1. Bad odor of urine   2. Urinary frequency   3. Other fatigue   4. Bowel habit changes   5. Chronic sinusitis, unspecified location   6. Gallstones   7. Iron deficiency anemia, unspecified iron deficiency anemia type     Plan:  Update urine culture; check PSA; 3.   Check CBC, CMP; 4.   Check stool culture, C. Diff; 5.   Check sinus CT; 6.   Update abdominal ultrasound; 7.   Check ferritin/ IBC today;   Follow up to be determined based on labs/ imaging- agree symptoms are concerning for possible infection but unsure source at this time;   No follow-ups on file.  Orders Placed This Encounter  Procedures   Urine Culture   Stool Culture    Standing Status:   Future    Expected Date:   11/21/2023    Expiration Date:   11/20/2024   Clostridium difficile culture-fecal    Standing Status:   Future    Expected Date:   11/21/2023    Expiration Date:   11/20/2024   CT MAXILLOFACIAL W CONTRAST    Standing Status:   Future    Expiration Date:   11/20/2024    If indicated for the ordered procedure, I authorize the administration of contrast media per Radiology protocol:   Yes    Preferred imaging location?:   [facility]   US Abdomen Complete    Standing Status:   Future    Expiration Date:   11/20/2024    Scheduling Instructions:     Please schedule along with sinus CT    Reason for Exam (SYMPTOM  OR DIAGNOSIS REQUIRED):   gallstones    Preferred imaging location?:   [facility]   CBC with Differential/Platelet   Comp Met (CMET)   PSA   IBC + Ferritin    Requested Prescriptions    No prescriptions requested or ordered in this encounter

## 2023-11-22 ENCOUNTER — Encounter: Payer: Self-pay | Admitting: Family

## 2023-11-22 LAB — CBC WITH DIFFERENTIAL/PLATELET
Basophils Absolute: 0 10*3/uL (ref 0.0–0.1)
Basophils Relative: 0.7 % (ref 0.0–3.0)
Eosinophils Absolute: 0 10*3/uL (ref 0.0–0.7)
Eosinophils Relative: 0.2 % (ref 0.0–5.0)
HCT: 43.7 % (ref 39.0–52.0)
Hemoglobin: 14.5 g/dL (ref 13.0–17.0)
Lymphocytes Relative: 22.8 % (ref 12.0–46.0)
Lymphs Abs: 1.4 10*3/uL (ref 0.7–4.0)
MCHC: 33.1 g/dL (ref 30.0–36.0)
MCV: 99.3 fl (ref 78.0–100.0)
Monocytes Absolute: 0.7 10*3/uL (ref 0.1–1.0)
Monocytes Relative: 10.9 % (ref 3.0–12.0)
Neutro Abs: 4.1 10*3/uL (ref 1.4–7.7)
Neutrophils Relative %: 65.4 % (ref 43.0–77.0)
Platelets: 233 10*3/uL (ref 150.0–400.0)
RBC: 4.4 Mil/uL (ref 4.22–5.81)
RDW: 13.6 % (ref 11.5–15.5)
WBC: 6.2 10*3/uL (ref 4.0–10.5)

## 2023-11-22 LAB — COMPREHENSIVE METABOLIC PANEL
ALT: 15 U/L (ref 0–53)
AST: 18 U/L (ref 0–37)
Albumin: 4 g/dL (ref 3.5–5.2)
Alkaline Phosphatase: 93 U/L (ref 39–117)
BUN: 19 mg/dL (ref 6–23)
CO2: 26 meq/L (ref 19–32)
Calcium: 8.9 mg/dL (ref 8.4–10.5)
Chloride: 104 meq/L (ref 96–112)
Creatinine, Ser: 0.89 mg/dL (ref 0.40–1.50)
GFR: 80.15 mL/min (ref >60.00)
Glucose, Bld: 102 mg/dL — ABNORMAL HIGH (ref 70–99)
Potassium: 4.5 meq/L (ref 3.5–5.1)
Sodium: 138 meq/L (ref 135–145)
Total Bilirubin: 0.4 mg/dL (ref 0.2–1.2)
Total Protein: 6.4 g/dL (ref 6.0–8.3)

## 2023-11-22 LAB — PSA: PSA: 3.3 ng/mL (ref 0.10–4.00)

## 2023-11-22 LAB — IBC + FERRITIN
Ferritin: 123 ng/mL (ref 22.0–322.0)
Iron: 76 ug/dL (ref 42–165)
Saturation Ratios: 28.6 % (ref 20.0–50.0)
TIBC: 266 ug/dL (ref 250.0–450.0)
Transferrin: 190 mg/dL — ABNORMAL LOW (ref 212.0–360.0)

## 2023-11-22 LAB — URINE CULTURE
MICRO NUMBER:: 16138044
Result:: NO GROWTH
SPECIMEN QUALITY:: ADEQUATE

## 2023-11-25 ENCOUNTER — Other Ambulatory Visit

## 2023-11-25 DIAGNOSIS — R194 Change in bowel habit: Secondary | ICD-10-CM

## 2023-11-25 NOTE — Progress Notes (Signed)
 Specimen drop off

## 2023-11-28 ENCOUNTER — Other Ambulatory Visit (INDEPENDENT_AMBULATORY_CARE_PROVIDER_SITE_OTHER): Payer: Self-pay

## 2023-11-28 ENCOUNTER — Encounter: Payer: Self-pay | Admitting: Family

## 2023-11-28 ENCOUNTER — Encounter: Payer: Self-pay | Admitting: Orthopaedic Surgery

## 2023-11-28 ENCOUNTER — Ambulatory Visit: Payer: BLUE CROSS/BLUE SHIELD | Admitting: Orthopaedic Surgery

## 2023-11-28 DIAGNOSIS — Z96651 Presence of right artificial knee joint: Secondary | ICD-10-CM

## 2023-11-28 LAB — SALMONELLA/SHIGELLA CULT, CAMPY EIA AND SHIGA TOXIN RFL ECOLI
MICRO NUMBER: 16150586
MICRO NUMBER:: 16150587
MICRO NUMBER:: 16150588
Result:: NOT DETECTED
SHIGA RESULT:: NOT DETECTED
SPECIMEN QUALITY: ADEQUATE
SPECIMEN QUALITY:: ADEQUATE
SPECIMEN QUALITY:: ADEQUATE

## 2023-11-28 NOTE — Progress Notes (Signed)
 The patient is now 4 months status post a right total knee arthroplasty.  He is 83 years old.  He reports good range of motion and strength and a little bit of a pulling sensation to the lateral aspect of the knee but overall he is pleased thus far.  He does have known arthritis of his left knee and he said he may see this at some point for that knee in terms of replacement.  Examination of his right knee shows the incision is well-healed.  His range of motion is entirely full and his swelling is minimal.  The knee feels loosely stable.  2 views of the right knee show well-seated total knee arthroplasty with no complicating features.  From the standpoint the right knee, he will continue to increase activities as comfort allows and continue strengthening exercises.  Will see him back in 6 months with a repeat AP and lateral of the right knee.  If he does have any issues with his left arthritic knee he knows he can come see [place] sooner.

## 2023-11-29 LAB — CLOSTRIDIUM DIFFICILE CULTURE-FECAL

## 2023-11-30 ENCOUNTER — Ambulatory Visit (HOSPITAL_BASED_OUTPATIENT_CLINIC_OR_DEPARTMENT_OTHER)
Admission: RE | Admit: 2023-11-30 | Discharge: 2023-11-30 | Disposition: A | Discharge status: Home / Self Care | Payer: Medicare Other | Source: Ambulatory Visit | Attending: Family | Admitting: Family

## 2023-11-30 DIAGNOSIS — J329 Chronic sinusitis, unspecified: Secondary | ICD-10-CM | POA: Diagnosis not present

## 2023-11-30 DIAGNOSIS — N281 Cyst of kidney, acquired: Secondary | ICD-10-CM | POA: Diagnosis not present

## 2023-11-30 DIAGNOSIS — K8020 Calculus of gallbladder without cholecystitis without obstruction: Secondary | ICD-10-CM | POA: Diagnosis not present

## 2023-11-30 MED ORDER — IOHEXOL 300 MG/ML  SOLN
75.0000 mL | Freq: Once | INTRAMUSCULAR | Status: AC | PRN
  Administered 2023-11-30: 75 mL via INTRAVENOUS

## 2023-12-16 ENCOUNTER — Encounter: Payer: Self-pay | Admitting: Family

## 2023-12-16 ENCOUNTER — Other Ambulatory Visit: Payer: Self-pay | Admitting: Family

## 2023-12-16 DIAGNOSIS — K8020 Calculus of gallbladder without cholecystitis without obstruction: Secondary | ICD-10-CM

## 2023-12-20 ENCOUNTER — Encounter: Payer: Self-pay | Admitting: Family

## 2023-12-20 ENCOUNTER — Other Ambulatory Visit: Payer: Self-pay | Admitting: Family

## 2023-12-20 DIAGNOSIS — J341 Cyst and mucocele of nose and nasal sinus: Secondary | ICD-10-CM

## 2024-01-06 DIAGNOSIS — T485X5A Adverse effect of other anti-common-cold drugs, initial encounter: Secondary | ICD-10-CM | POA: Diagnosis not present

## 2024-01-06 DIAGNOSIS — J341 Cyst and mucocele of nose and nasal sinus: Secondary | ICD-10-CM | POA: Diagnosis not present

## 2024-01-06 DIAGNOSIS — J342 Deviated nasal septum: Secondary | ICD-10-CM | POA: Diagnosis not present

## 2024-01-06 DIAGNOSIS — J310 Chronic rhinitis: Secondary | ICD-10-CM | POA: Diagnosis not present

## 2024-01-06 DIAGNOSIS — R442 Other hallucinations: Secondary | ICD-10-CM | POA: Diagnosis not present

## 2024-01-06 DIAGNOSIS — J3489 Other specified disorders of nose and nasal sinuses: Secondary | ICD-10-CM | POA: Diagnosis not present

## 2024-01-17 DIAGNOSIS — H04123 Dry eye syndrome of bilateral lacrimal glands: Secondary | ICD-10-CM | POA: Diagnosis not present

## 2024-01-17 DIAGNOSIS — H2513 Age-related nuclear cataract, bilateral: Secondary | ICD-10-CM | POA: Diagnosis not present

## 2024-02-02 IMAGING — RF DG SWALLOWING FUNCTION
13 of 16 series · 19 of 24 positions shown · non-contrast
Comparison: Chest CT 11/15/2020

CLINICAL DATA: Dysphagia. Atypical infectious bronchiolitis in the
right lung base.

EXAM:
MODIFIED BARIUM SWALLOW
TECHNIQUE: Different consistencies of barium were administered orally to the
patient by the Speech Pathologist. Imaging of the pharynx was
performed in the lateral projection. The radiologist was present in
the fluoroscopy room for this study, providing personal supervision.
FLUOROSCOPY:
Fluoroscopy Time:  1 minute, 48 seconds
Radiation Exposure Index (if provided by the fluoroscopic device):
11.6 mGy air kerma
Number of Acquired Spot Images: 0

[Series 1: cp_standard · 2 of 18 frames shown (1 of 13)]
[frame 3/18]
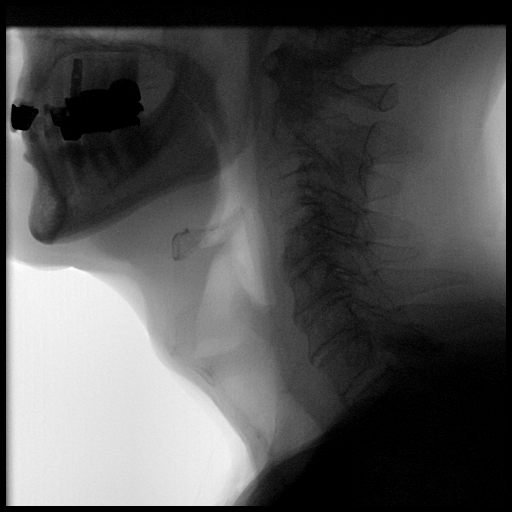
[frame 18/18]
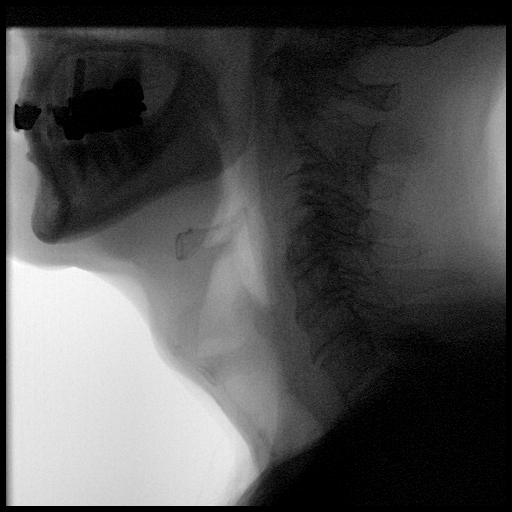

[Series 3: cp_standard · 2 of 320 frames shown (2 of 13)]
[frame 49/320]
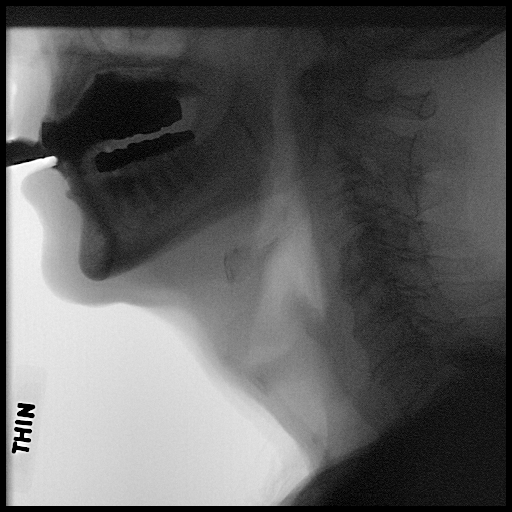
[frame 273/320]
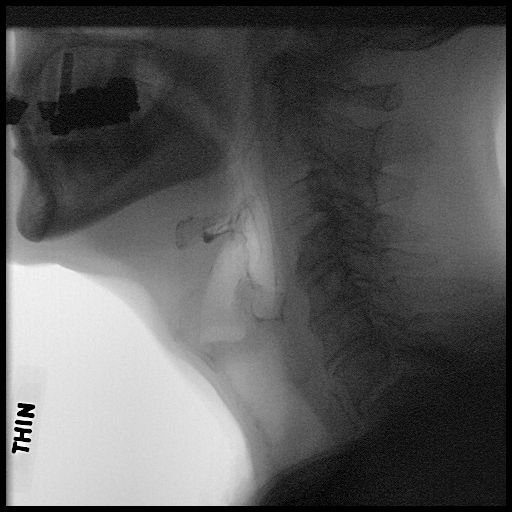

[Series 4: cp_standard · 1 of 113 frames shown (3 of 13)]
[frame 96/113]
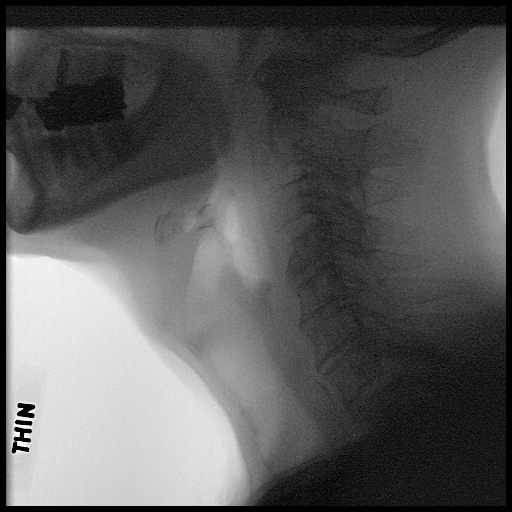

[Series 5: cp_standard · 1 of 376 frames shown (4 of 13)]
[frame 57/376]
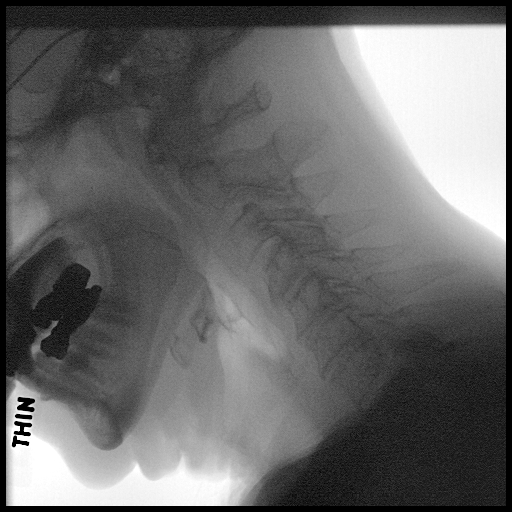

[Series 6: cp_standard · 1 of 137 frames shown (5 of 13)]
[frame 69/137]
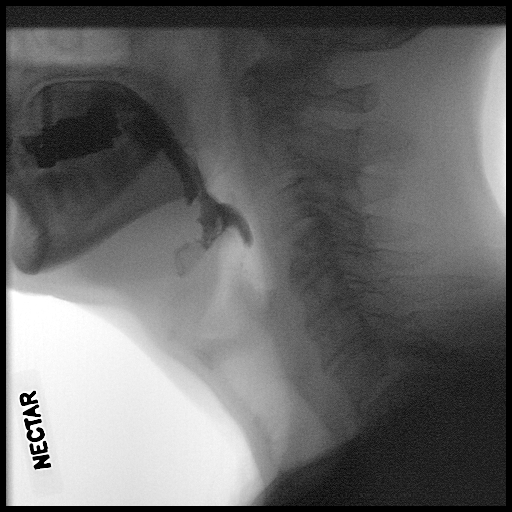

[Series 7: cp_standard · 2 of 194 frames shown (6 of 13)]
[frame 98/194]
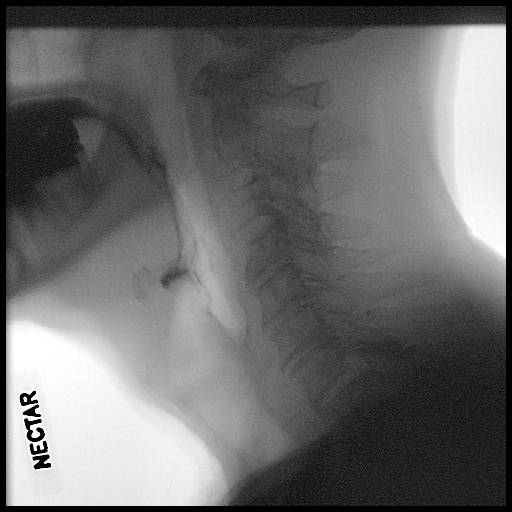
[frame 165/194]
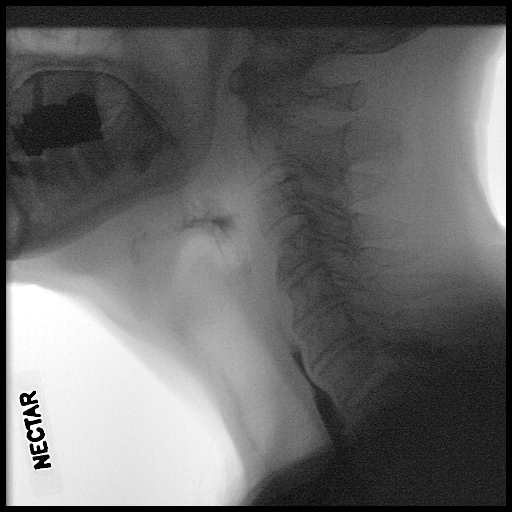

[Series 9: cp_standard · 1 of 302 frames shown (7 of 13)]
[frame 152/302]
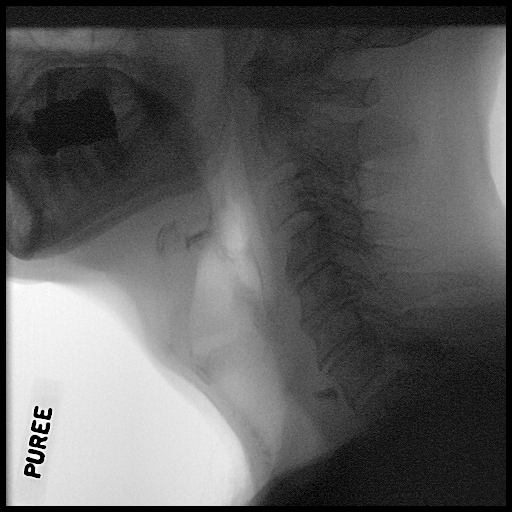

[Series 10: cp_standard · 2 of 150 frames shown (8 of 13)]
[frame 23/150]
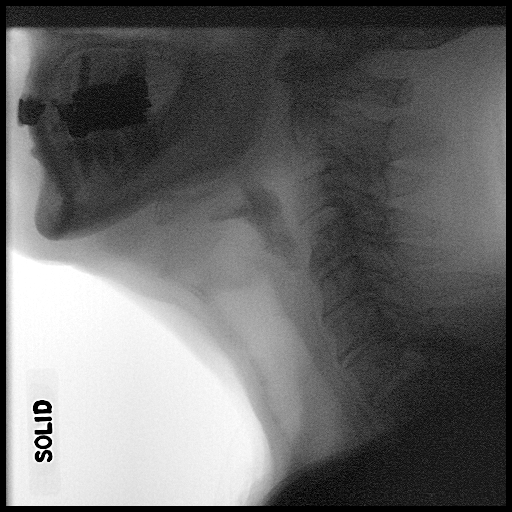
[frame 76/150]
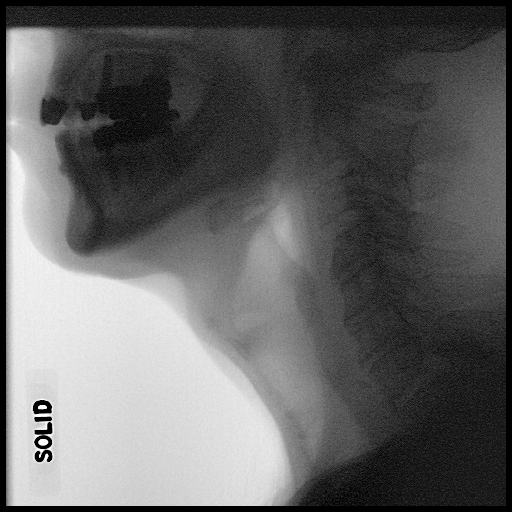

[Series 11: cp_standard · 1 of 383 frames shown (9 of 13)]
[frame 87/383]
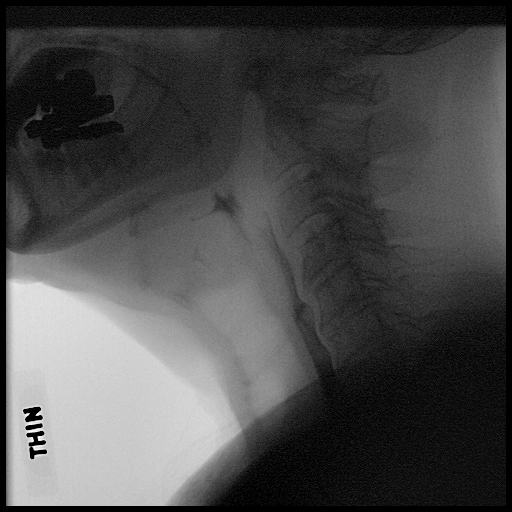

[Series 12: cp_standard · 1 of 148 frames shown (10 of 13)]
[frame 126/148]
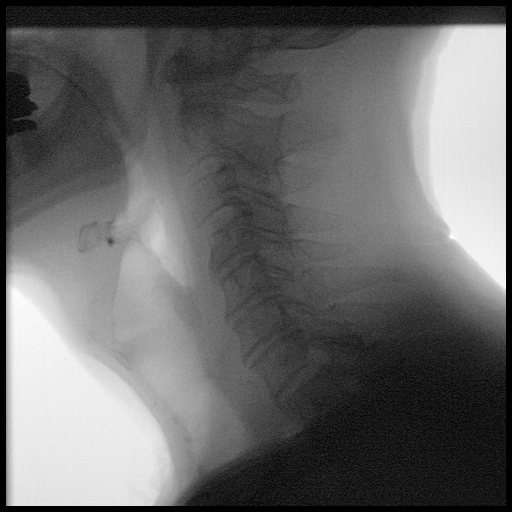

[Series 13: cp_standard · 1 of 343 frames shown (11 of 13)]
[frame 52/343]
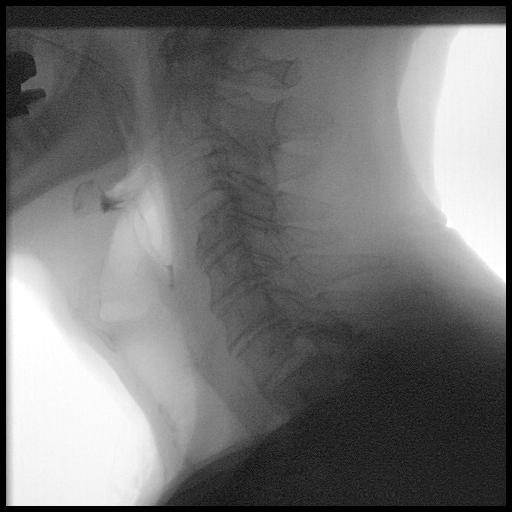

[Series 14: cp_standard · 2 of 29 frames shown (12 of 13)]
[frame 5/29]
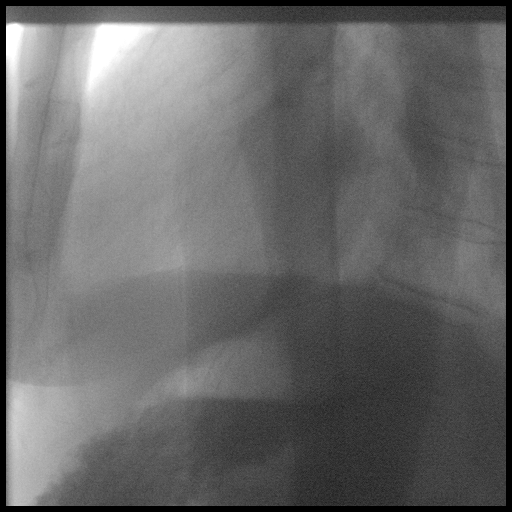
[frame 25/29]
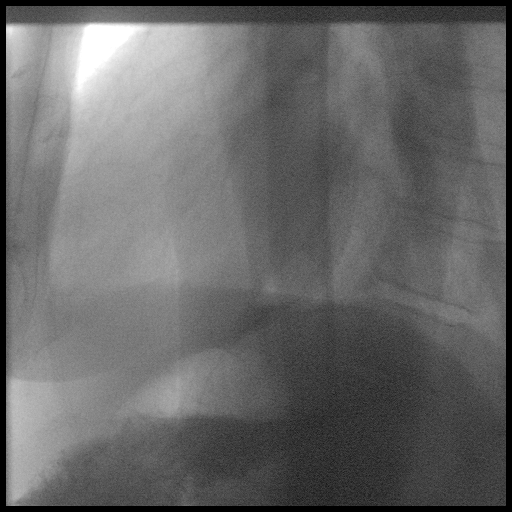

[Series 16: cp_standard · 2 of 41 frames shown (13 of 13)]
[frame 1/41]
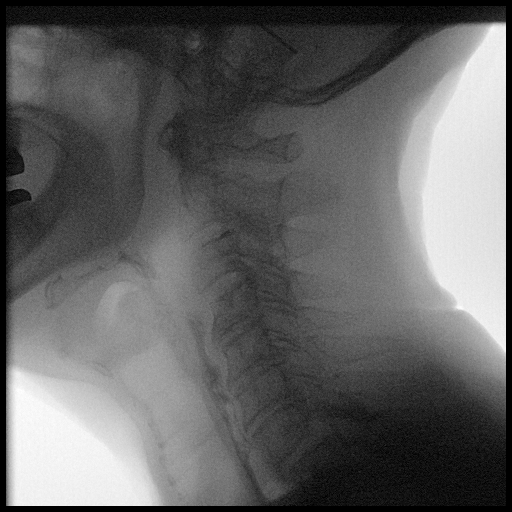
[frame 35/41]
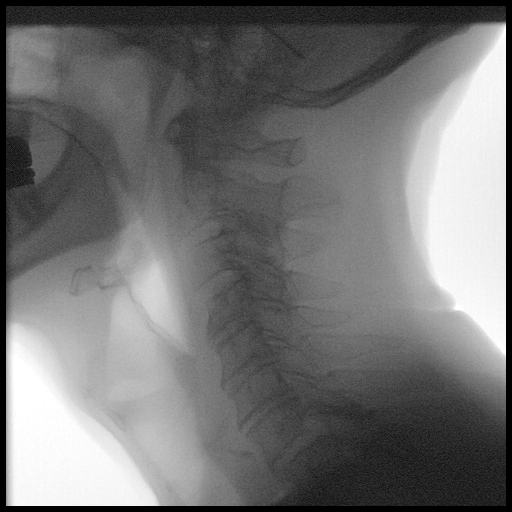

[19 of 24 positions shown; findings below may reference images not displayed]

FINDINGS: Thin liquid: Repeatable laryngeal penetration to the level of the
cords, occasionally eliciting a cough which was effective. No overt
tracheal aspiration observed.

Nectar: Unremarkable

Puree: Unremarkable

Solid: Unremarkable

Tablet: Transient mid esophageal stasis which subsequently passed.
IMPRESSION: 1. Laryngeal penetration with thin liquids.

Please refer to the Speech Pathologists report for complete details
and recommendations.

## 2024-02-07 ENCOUNTER — Ambulatory Visit (INDEPENDENT_AMBULATORY_CARE_PROVIDER_SITE_OTHER): Admitting: Family

## 2024-02-07 VITALS — BP 110/58 | HR 65 | Temp 98.0°F | Resp 16 | Ht 68.0 in | Wt 141.2 lb

## 2024-02-07 DIAGNOSIS — R442 Other hallucinations: Secondary | ICD-10-CM

## 2024-02-07 NOTE — Progress Notes (Signed)
 Brandon Acosta is a 83 y.o. 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 90 tablet 3   omeprazole (PRILOSEC) 20 MG capsule Take 20 mg by mouth daily.     oxymetazoline  (AFRIN) 0.05 % nasal spray Place 1 spray into both nostrils at bedtime.     No current facility-administered medications for this visit.    Allergies: Doxycycline and Simvastatin   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HCC)    Arthritis    Diverticulitis    GERD (gastroesophageal reflux disease)    History of kidney stones    Hyperlipemia    Hyperlipidemia    Osteoporosis    Palpitations     Past Surgical History:  Procedure Laterality Date   APPENDECTOMY  as child   CARDIAC CATHETERIZATION N/A 10/16/2016   Procedure: Left Heart Cath and Coronary Angiography;  Surgeon: Lucendia Rusk, MD;  Location: [facility] INVASIVE CV LAB;  Service: Cardiovascular;  Laterality: N/A;   COLONOSCOPY WITH PROPOFOL  N/A 02/11/2014   Procedure: COLONOSCOPY WITH PROPOFOL ;  Surgeon: Brice Campi, MD;  Location: [facility] ENDOSCOPY;  Service: Endoscopy;  Laterality: N/A;   HERNIA REPAIR  2024   KNEE ARTHROSCOPY Right 09/25/2011   torn meniscus repair   PROSTATE SURGERY      TONSILLECTOMY     TOTAL KNEE ARTHROPLASTY Right 07/26/2023   Procedure: RIGHT TOTAL KNEE ARTHROPLASTY;  Surgeon: Arnie Lao, MD;  Location: [facility] ORS;  Service: Orthopedics;  Laterality: Right;    Family History  Problem Relation Age of Onset   Alcoholism Mother    Breast cancer Mother    Heart disease Mother    Cancer - Lung Father    Breast cancer Sister    Cancer - Ovarian Sister    Breast cancer Sister    Tremor Sister     Social History   Tobacco Use   Smoking status: Former    Current packs/day: 0.00    Average packs/day: 0.5 packs/day for 20.0 years (10.0 ttl pk-yrs)    Types: Cigarettes    Start date: 09/24/1956    Quit date: 09/24/1976    Years since quitting: 47.4   Smokeless tobacco: Never  Substance Use Topics   Alcohol use: Yes    Comment: occassionally 1 glass wine per day    Subjective:   Follow up to touch base about chronic needs-  Has struggled with abnormal smell for the past year; did see ENT in April and exam was reassuring; thankfully patient has noticed improvement in frequency and intensity of smell recently; actually feeling much better and wanted to discuss how much further testing might be necessary;   Objective:  Vitals:   02/07/24 1024  BP: (!) 110/58  Pulse: 65  Resp: 16  Temp: 98  F (36.7 C)  TempSrc: Oral  SpO2: 95%  Weight: 141 lb 3.2 oz (64 kg)  Height: 5\' 8"  (1.727 m)    General: Well developed, well nourished, in no acute distress  Skin : Warm and dry.  Head: Normocephalic and atraumatic  Eyes: Sclera and conjunctiva clear; pupils round and reactive to light; extraocular movements intact  Ears: External normal; canals clear; tympanic membranes normal  Oropharynx: Pink, supple. No suspicious lesions  Neck: Supple without thyromegaly, adenopathy  Lungs: Respirations unlabored;  Neurologic: Alert and oriented; speech intact; face symmetrical; moves all extremities well; CNII-XII intact without focal deficit   Assessment:   1. Phantosmia     Plan:  Thankfully patients symptoms are resolving; reviewed work up that has been done to this point and all tests are reassuring;  Discussed that meeting with surgeon about gallstones is not emergent and he should do at his convenience;   No follow-ups on file.  No orders of the defined types were placed in this encounter.   Requested Prescriptions    No prescriptions requested or ordered in this encounter

## 2024-02-22 DIAGNOSIS — J019 Acute sinusitis, unspecified: Secondary | ICD-10-CM | POA: Diagnosis not present

## 2024-03-09 ENCOUNTER — Encounter: Payer: Self-pay | Admitting: Cardiology

## 2024-03-09 ENCOUNTER — Ambulatory Visit: Attending: Cardiology | Admitting: Cardiology

## 2024-03-09 VITALS — BP 122/65 | HR 62 | Ht 68.0 in | Wt 145.0 lb

## 2024-03-09 DIAGNOSIS — I2510 Atherosclerotic heart disease of native coronary artery without angina pectoris: Secondary | ICD-10-CM | POA: Diagnosis not present

## 2024-03-09 DIAGNOSIS — I4719 Other supraventricular tachycardia: Secondary | ICD-10-CM | POA: Diagnosis not present

## 2024-03-09 DIAGNOSIS — E7800 Pure hypercholesterolemia, unspecified: Secondary | ICD-10-CM | POA: Insufficient documentation

## 2024-03-09 NOTE — Progress Notes (Signed)
 Cardiology Office Note:  .   Date:  03/09/2024  ID:  Brandon Acosta, DOB 10-20-1940, MRN [id number] PCP: Adra Alanis, FNP  [facility] Providers Cardiologist:  Dorothye Gathers, MD     History of Present Illness: .   Brandon Acosta is a 83 y.o. male Discussed the use of AI scribe software for clinical note transcription with the patient, who gave verbal consent to proceed.  History of Present Illness Brandon Acosta is an 83 year old male with atrial tachycardia who presents for a routine cardiovascular follow-up. Previously seen by Dr. Maximo Spar, a lipid specialist, for evaluation of coronary calcium score.  He has a history of atrial tachycardia and feels well overall, with no significant chest pain or shortness of breath since his last visit two years ago. He experiences occasional 'funny feelings' but finds them infrequent and not concerning. Previous EKGs showed sinus rhythm with PACs and atrial tachycardia, but not atrial fibrillation.  He takes Zetia  (ezetimibe ) 10 mg three times a week to manage his LDL cholesterol, which has decreased from 120 to 90. His last LDL was 88 in September. His hemoglobin A1c is 5.8, indicating prediabetes.  He has a history of esophageal spasm, treated with Prilosec (omeprazole) 20 mg daily, and reports no significant issues with this condition recently.  He recently moved to [place] and enjoys an active lifestyle, including playing golf three times a week. He underwent hernia surgery after starting to play pickleball and had a knee replacement in October of the previous year.  He describes experiencing a strange odor following a flu-like illness about a year ago, which he initially thought might be related to COVID-19. The odor, described as 'cooked meat or beef broth', occurs randomly and lasts only a few breaths. He has undergone various tests, including scans for kidney stones and gallbladder stones, and has been evaluated by a  pulmonary specialist and an ENT, but no definitive cause has been identified.     Studies Reviewed: .        Results LABS LDL: 88 HbA1c: 5.8 (05/2023) Hb: 14.5 (05/2023)  RADIOLOGY Coronary calcium score: 50  DIAGNOSTIC EKG: Sinus rhythm, PACs, atrial tachycardia Risk Assessment/Calculations:            Physical Exam:   VS:  BP 122/65   Pulse 62   Ht 5' 8 (1.727 m)   Wt 145 lb (65.8 kg)   SpO2 97%   BMI 22.05 kg/m    Wt Readings from Last 3 Encounters:  03/09/24 145 lb (65.8 kg)  02/07/24 141 lb 3.2 oz (64 kg)  11/21/23 146 lb 3.2 oz (66.3 kg)    GEN: Well nourished, well developed in no acute distress NECK: No JVD; No carotid bruits CARDIAC: RRR, no murmurs, no rubs, no gallops RESPIRATORY:  Clear to auscultation without rales, wheezing or rhonchi  ABDOMEN: Soft, non-tender, non-distended EXTREMITIES:  No edema; No deformity   ASSESSMENT AND PLAN: .    Assessment and Plan Assessment & Plan Atrial tachycardia Atrial tachycardia with infrequent, self-limiting palpitations. No recent atrial fibrillation episodes.  Hyperlipidemia Initiated on Zetia  10 mg three times a week, reducing LDL from 120 to 90. - Continue Zetia  10 mg three times a week.  Esophageal spasm Esophageal spasm managed with Prilosec. Asymptomatic currently. - Continue Prilosec as needed.  Prediabetes Hemoglobin A1c is 5.8, indicating prediabetes. Advised to maintain diet and exercise to prevent diabetes progression. - Continue current diet and exercise regimen.  History of gallbladder stones Experiencing a  strange odor, possibly related to gallbladder stones. Referred to Dr. Holland Lundborg for further evaluation. - Follow up with Dr. Holland Lundborg for evaluation of gallbladder stones.         Dispo: 2 yrs  Signed, Dorothye Gathers, MD

## 2024-03-09 NOTE — Patient Instructions (Signed)
 Medication Instructions:  The current medical regimen is effective;  continue present plan and medications.  *If you need a refill on your cardiac medications before your next appointment, please call your pharmacy*  Follow-Up: At [facility], you and your health needs are our priority.  As part of our continuing mission to provide you with exceptional heart care, our providers are all part of one team.  This team includes your primary Cardiologist (physician) and Advanced Practice Providers or APPs (Physician Assistants and Nurse Practitioners) who all work together to provide you with the care you need, when you need it.  Your next appointment:   2 year(s)  Provider:   Donato Schultz, MD     We recommend signing up for the patient portal called "MyChart".  Sign up information is provided on this After Visit Summary.  MyChart is used to connect with patients for Virtual Visits (Telemedicine).  Patients are able to view lab/test results, encounter notes, upcoming appointments, etc.  Non-urgent messages can be sent to your provider as well.   To learn more about what you can do with MyChart, go to [email or web address].

## 2024-03-11 DIAGNOSIS — K8020 Calculus of gallbladder without cholecystitis without obstruction: Secondary | ICD-10-CM | POA: Diagnosis not present

## 2024-03-12 ENCOUNTER — Telehealth: Payer: Self-pay | Admitting: Family

## 2024-03-12 NOTE — Telephone Encounter (Signed)
 Copied from CRM [id number]. Topic: Medicare AWV >> Mar 12, 2024  1:42 PM Juliana Ocean wrote: Reason for CRM: LVM 03/12/2024 to schedule AWV. Please schedule Virtual or Telehealth visits ONLY.   Rosalee Collins; Care Guide Ambulatory Clinical Support [place] [facility] Direct Dial: [phone]

## 2024-04-21 DIAGNOSIS — N200 Calculus of kidney: Secondary | ICD-10-CM | POA: Diagnosis not present

## 2024-05-14 ENCOUNTER — Encounter: Payer: Self-pay | Admitting: Family

## 2024-05-18 ENCOUNTER — Telehealth: Payer: Self-pay | Admitting: Family

## 2024-05-18 ENCOUNTER — Telehealth: Payer: Self-pay

## 2024-05-18 NOTE — Telephone Encounter (Signed)
 Copied from CRM [id number]. Topic: Appointments - Scheduling Inquiry for Clinic >> May 18, 2024  2:02 PM Turkey A wrote: Reason for CRM: Patient called to see how long will it take for his appointment and can his wife be slid in around 11 or will she have to wait until her 12:00PM appt? Please call patients wife number at [phone]

## 2024-05-18 NOTE — Telephone Encounter (Unsigned)
 Copied from CRM #[id number]. Topic: Appointments - Transfer of Care >> May 18, 2024  1:44 PM Gennette ORN wrote: Pt is requesting to transfer FROM: Dr.Murray Pt is requesting to transfer TO: Dr. Almarie Reason for requested transfer: he like [facility]  It is the responsibility of the team the patient would like to transfer to (Dr. Almarie) to reach out to the patient if for any reason this transfer is not acceptable.

## 2024-06-16 ENCOUNTER — Encounter: Payer: BLUE CROSS/BLUE SHIELD | Admitting: Family

## 2024-06-30 ENCOUNTER — Encounter: Payer: Self-pay | Admitting: Family Medicine

## 2024-06-30 ENCOUNTER — Ambulatory Visit (INDEPENDENT_AMBULATORY_CARE_PROVIDER_SITE_OTHER): Admitting: Family Medicine

## 2024-06-30 VITALS — BP 117/58 | HR 58 | Ht 68.0 in | Wt 145.0 lb

## 2024-06-30 DIAGNOSIS — I2510 Atherosclerotic heart disease of native coronary artery without angina pectoris: Secondary | ICD-10-CM

## 2024-06-30 DIAGNOSIS — E7800 Pure hypercholesterolemia, unspecified: Secondary | ICD-10-CM

## 2024-06-30 DIAGNOSIS — N400 Enlarged prostate without lower urinary tract symptoms: Secondary | ICD-10-CM | POA: Diagnosis not present

## 2024-06-30 DIAGNOSIS — Z23 Encounter for immunization: Secondary | ICD-10-CM | POA: Diagnosis not present

## 2024-06-30 DIAGNOSIS — Z0000 Encounter for general adult medical examination without abnormal findings: Secondary | ICD-10-CM

## 2024-06-30 NOTE — Progress Notes (Signed)
 Established Patient Office Visit  Subjective   Patient ID: Brandon Acosta, male    DOB: [birth year]  Age: 83 y.o. MRN: [id number]  Chief Complaint  Patient presents with   Establish Care    HPI    Discussed the use of AI scribe software for clinical note transcription with the patient, who gave verbal consent to proceed.  History of Present Illness Brandon Acosta is an 83 year old male who presents to transfer care..  He has a history of arthritis, with a right knee replacement performed in November of last year. Currently, arthritis is not a significant problem, although he experiences some tightness and a minor hip issue.   He has a history of high cholesterol and previously experienced leg cramps with statin use, leading to discontinuation of statins two years ago. He currently takes Zetia  three times a week, which has lowered his LDL by 20-25 points. His last cholesterol check was about a year ago, and he reports stable lab results from four to five months ago.  He has a history of kidney stones, first occurring three years ago, which were asymptomatic. He underwent lithotripsy for stones in both kidneys and has not experienced any issues since, although stones remain in the kidneys.  He has a history of reflux, for which he takes Prilosec.   He reports a history of osteoporosis, described as borderline osteopenia/osteoporosis, for which he took medication briefly about eight years ago. He has never had a fracture and maintains an active lifestyle, including regular exercise, and does not currently take calcium or vitamin D supplements. He is not interested in repeating a DEXA scan.   He underwent a cardiac catheterization several years ago and a hernia repair last year after developing a hernia from playing pickleball. He also had a Urolift procedure for prostate issues three years ago and reports no changes since his last urology visit a month  ago.          ROS All review of systems negative except what is listed in the HPI    Objective:     BP (!) 117/58   Pulse (!) 58   Ht 5' 8 (1.727 m)   Wt 145 lb (65.8 kg)   SpO2 100%   BMI 22.05 kg/m    Physical Exam Vitals reviewed.  Constitutional:      Appearance: Normal appearance.  Cardiovascular:     Rate and Rhythm: Normal rate and regular rhythm.     Heart sounds: Normal heart sounds.  Pulmonary:     Effort: Pulmonary effort is normal.     Breath sounds: Normal breath sounds.  Skin:    General: Skin is warm and dry.  Neurological:     Mental Status: He is alert and oriented to person, place, and time.  Psychiatric:        Mood and Affect: Mood normal.        Behavior: Behavior normal.        Thought Content: Thought content normal.        Judgment: Judgment normal.      No results found for any visits on 06/30/24.    The ASCVD Risk score (Arnett DK, et al., 2019) failed to calculate for the following reasons:   The 2019 ASCVD risk score is only valid for ages 98 to 65    Assessment & Plan:   Problem List Items Addressed This Visit       Active Problems   BPH (benign prostatic hyperplasia)  Following with urology. No acute concerns.       Pure hypercholesterolemia   Managed with Zetia  three times a week, effectively lowering LDL. Statins caused leg cramps. - Continue Zetia  three times a week and heart healthy lifestyle.      Coronary artery disease involving native heart   Established with cardiology. Asymptomatic. Heart healthy lifestyle encouraged. Last Coronary Calcium scan was good.       Other Visit Diagnoses       Encounter for medical examination to establish care    -  Primary      Declined updating labs today.      Return in about 6 months (around 12/29/2024) for chronic disease management/ schedule AWV.    [facility] KATHEE Mon, NP

## 2024-06-30 NOTE — Assessment & Plan Note (Signed)
 Established with cardiology. Asymptomatic. Heart healthy lifestyle encouraged. Last Coronary Calcium scan was good.

## 2024-06-30 NOTE — Assessment & Plan Note (Signed)
 Following with urology. No acute concerns.

## 2024-06-30 NOTE — Assessment & Plan Note (Signed)
 Managed with Zetia  three times a week, effectively lowering LDL. Statins caused leg cramps. - Continue Zetia  three times a week and heart healthy lifestyle.

## 2024-07-03 DIAGNOSIS — Z23 Encounter for immunization: Secondary | ICD-10-CM | POA: Diagnosis not present

## 2024-07-27 ENCOUNTER — Encounter: Payer: Self-pay | Admitting: Radiology

## 2024-08-31 ENCOUNTER — Encounter: Admitting: Orthopaedic Surgery

## 2025-01-05 ENCOUNTER — Ambulatory Visit

## 2025-01-06 ENCOUNTER — Ambulatory Visit: Admitting: Family Medicine
